# Patient Record
Sex: Male | Born: 1950 | Race: Black or African American | Hispanic: No | Smoking: Current every day smoker
Health system: Southern US, Community
[De-identification: ages and names within clinical notes are randomized; demographics above are authoritative.]

## PROBLEM LIST (undated history)

## (undated) DIAGNOSIS — I714 Abdominal aortic aneurysm, without rupture, unspecified: Secondary | ICD-10-CM

## (undated) DIAGNOSIS — E559 Vitamin D deficiency, unspecified: Secondary | ICD-10-CM

## (undated) DIAGNOSIS — F1910 Other psychoactive substance abuse, uncomplicated: Secondary | ICD-10-CM

## (undated) DIAGNOSIS — I499 Cardiac arrhythmia, unspecified: Secondary | ICD-10-CM

## (undated) DIAGNOSIS — E878 Other disorders of electrolyte and fluid balance, not elsewhere classified: Secondary | ICD-10-CM

## (undated) DIAGNOSIS — E785 Hyperlipidemia, unspecified: Secondary | ICD-10-CM

## (undated) DIAGNOSIS — I10 Essential (primary) hypertension: Secondary | ICD-10-CM

## (undated) DIAGNOSIS — I739 Peripheral vascular disease, unspecified: Secondary | ICD-10-CM

## (undated) DIAGNOSIS — K219 Gastro-esophageal reflux disease without esophagitis: Secondary | ICD-10-CM

## (undated) HISTORY — PX: COLONOSCOPY: SHX174

## (undated) HISTORY — DX: Peripheral vascular disease, unspecified: I73.9

## (undated) HISTORY — DX: Gastro-esophageal reflux disease without esophagitis: K21.9

## (undated) HISTORY — DX: Hyperlipidemia, unspecified: E78.5

## (undated) HISTORY — DX: Cardiac arrhythmia, unspecified: I49.9

## (undated) HISTORY — PX: HERNIA REPAIR: SHX51

## (undated) HISTORY — PX: UPPER GASTROINTESTINAL ENDOSCOPY: SHX188

## (undated) HISTORY — DX: Abdominal aortic aneurysm, without rupture: I71.4

## (undated) HISTORY — PX: TONSILLECTOMY: SUR1361

## (undated) HISTORY — DX: Other psychoactive substance abuse, uncomplicated: F19.10

## (undated) HISTORY — DX: Vitamin D deficiency, unspecified: E55.9

## (undated) HISTORY — PX: ROBOTIC ADRENALECTOMY: SHX6407

## (undated) HISTORY — DX: Abdominal aortic aneurysm, without rupture, unspecified: I71.40

---

## 2004-06-15 ENCOUNTER — Ambulatory Visit: Payer: Self-pay | Admitting: Internal Medicine

## 2004-06-19 ENCOUNTER — Ambulatory Visit: Payer: Self-pay | Admitting: *Deleted

## 2004-06-22 ENCOUNTER — Ambulatory Visit: Payer: Self-pay | Admitting: Internal Medicine

## 2004-10-30 ENCOUNTER — Ambulatory Visit: Payer: Self-pay | Admitting: Family Medicine

## 2005-06-01 ENCOUNTER — Ambulatory Visit: Payer: Self-pay | Admitting: Family Medicine

## 2005-07-25 ENCOUNTER — Ambulatory Visit: Payer: Self-pay | Admitting: Family Medicine

## 2005-12-31 ENCOUNTER — Ambulatory Visit: Payer: Self-pay | Admitting: Family Medicine

## 2006-04-10 ENCOUNTER — Ambulatory Visit: Payer: Self-pay | Admitting: Family Medicine

## 2006-04-23 ENCOUNTER — Ambulatory Visit: Payer: Self-pay | Admitting: Cardiology

## 2006-06-03 ENCOUNTER — Ambulatory Visit: Payer: Self-pay | Admitting: Family Medicine

## 2006-09-25 ENCOUNTER — Ambulatory Visit: Payer: Self-pay | Admitting: Family Medicine

## 2007-01-18 DIAGNOSIS — Z87898 Personal history of other specified conditions: Secondary | ICD-10-CM

## 2007-01-18 DIAGNOSIS — K056 Periodontal disease, unspecified: Secondary | ICD-10-CM | POA: Insufficient documentation

## 2007-01-18 DIAGNOSIS — I10 Essential (primary) hypertension: Secondary | ICD-10-CM

## 2007-01-18 DIAGNOSIS — K069 Disorder of gingiva and edentulous alveolar ridge, unspecified: Secondary | ICD-10-CM

## 2007-01-18 DIAGNOSIS — E785 Hyperlipidemia, unspecified: Secondary | ICD-10-CM

## 2007-01-21 DIAGNOSIS — F1411 Cocaine abuse, in remission: Secondary | ICD-10-CM | POA: Insufficient documentation

## 2007-01-21 DIAGNOSIS — Z9889 Other specified postprocedural states: Secondary | ICD-10-CM | POA: Insufficient documentation

## 2007-01-28 ENCOUNTER — Ambulatory Visit: Payer: Self-pay | Admitting: Family Medicine

## 2007-01-31 ENCOUNTER — Emergency Department (HOSPITAL_COMMUNITY): Admission: EM | Admit: 2007-01-31 | Discharge: 2007-01-31 | Payer: Self-pay | Admitting: Emergency Medicine

## 2007-02-12 ENCOUNTER — Encounter (INDEPENDENT_AMBULATORY_CARE_PROVIDER_SITE_OTHER): Payer: Self-pay | Admitting: *Deleted

## 2007-03-13 ENCOUNTER — Emergency Department (HOSPITAL_COMMUNITY): Admission: EM | Admit: 2007-03-13 | Discharge: 2007-03-13 | Payer: Self-pay | Admitting: Emergency Medicine

## 2007-05-16 ENCOUNTER — Ambulatory Visit: Payer: Self-pay | Admitting: Nurse Practitioner

## 2007-05-16 DIAGNOSIS — B351 Tinea unguium: Secondary | ICD-10-CM

## 2007-06-06 ENCOUNTER — Ambulatory Visit: Payer: Self-pay | Admitting: Family Medicine

## 2007-06-06 ENCOUNTER — Encounter (INDEPENDENT_AMBULATORY_CARE_PROVIDER_SITE_OTHER): Payer: Self-pay | Admitting: Nurse Practitioner

## 2007-06-06 LAB — CONVERTED CEMR LAB
Alkaline Phosphatase: 75 units/L (ref 39–117)
Basophils Absolute: 0 10*3/uL (ref 0.0–0.1)
Basophils Relative: 0 % (ref 0–1)
CO2: 28 meq/L (ref 19–32)
Chloride: 102 meq/L (ref 96–112)
Creatinine, Ser: 1.31 mg/dL (ref 0.40–1.50)
Eosinophils Absolute: 0.1 10*3/uL (ref 0.0–0.7)
Glucose, Bld: 96 mg/dL (ref 70–99)
HDL: 50 mg/dL (ref >39)
LDL Cholesterol: 115 mg/dL — ABNORMAL HIGH (ref 0–99)
Lymphs Abs: 1.6 10*3/uL (ref 0.7–4.0)
Microalb, Ur: 1.14 mg/dL (ref 0.00–1.89)
Monocytes Relative: 9 % (ref 3–12)
Nitrite: NEGATIVE
Platelets: 270 10*3/uL (ref 150–400)
Potassium: 3.8 meq/L (ref 3.5–5.3)
RBC: 4.83 M/uL (ref 4.22–5.81)
Specific Gravity, Urine: 1.025
Total CHOL/HDL Ratio: 3.6
Triglycerides: 83 mg/dL (ref <150)
Urobilinogen, UA: NEGATIVE
VLDL: 17 mg/dL (ref 0–40)
pH: 5.5

## 2007-06-09 ENCOUNTER — Encounter (INDEPENDENT_AMBULATORY_CARE_PROVIDER_SITE_OTHER): Payer: Self-pay | Admitting: Nurse Practitioner

## 2007-07-18 ENCOUNTER — Ambulatory Visit: Payer: Self-pay | Admitting: Nurse Practitioner

## 2007-07-18 LAB — CONVERTED CEMR LAB: Cholesterol, target level: 200 mg/dL

## 2007-09-11 ENCOUNTER — Emergency Department (HOSPITAL_COMMUNITY): Admission: EM | Admit: 2007-09-11 | Discharge: 2007-09-12 | Payer: Self-pay | Admitting: Emergency Medicine

## 2007-12-08 ENCOUNTER — Emergency Department (HOSPITAL_COMMUNITY): Admission: EM | Admit: 2007-12-08 | Discharge: 2007-12-08 | Payer: Self-pay | Admitting: Family Medicine

## 2008-09-06 ENCOUNTER — Ambulatory Visit: Payer: Self-pay | Admitting: Family Medicine

## 2008-09-06 LAB — CONVERTED CEMR LAB
Albumin: 4 g/dL (ref 3.5–5.2)
BUN: 12 mg/dL (ref 6–23)
CO2: 21 meq/L (ref 19–32)
Calcium: 9.2 mg/dL (ref 8.4–10.5)
Chloride: 107 meq/L (ref 96–112)
Creatinine, Ser: 1.08 mg/dL (ref 0.40–1.50)
Potassium: 3.9 meq/L (ref 3.5–5.3)
Total Protein: 7 g/dL (ref 6.0–8.3)

## 2008-09-10 ENCOUNTER — Encounter (INDEPENDENT_AMBULATORY_CARE_PROVIDER_SITE_OTHER): Payer: Self-pay | Admitting: Family Medicine

## 2008-09-10 ENCOUNTER — Emergency Department (HOSPITAL_COMMUNITY): Admission: EM | Admit: 2008-09-10 | Discharge: 2008-09-10 | Payer: Self-pay | Admitting: Emergency Medicine

## 2008-09-27 ENCOUNTER — Ambulatory Visit: Payer: Self-pay | Admitting: Family Medicine

## 2008-09-27 DIAGNOSIS — M169 Osteoarthritis of hip, unspecified: Secondary | ICD-10-CM

## 2008-10-28 ENCOUNTER — Ambulatory Visit: Payer: Self-pay | Admitting: Family Medicine

## 2008-10-28 LAB — CONVERTED CEMR LAB
ALT: 18 units/L (ref 0–53)
AST: 23 units/L (ref 0–37)
Calcium: 9.8 mg/dL (ref 8.4–10.5)
Chloride: 104 meq/L (ref 96–112)
Glucose, Bld: 93 mg/dL (ref 70–99)
HDL: 59 mg/dL (ref >39)
Total Bilirubin: 0.8 mg/dL (ref 0.3–1.2)
Total CHOL/HDL Ratio: 3
Triglycerides: 82 mg/dL (ref <150)

## 2008-10-29 ENCOUNTER — Encounter (INDEPENDENT_AMBULATORY_CARE_PROVIDER_SITE_OTHER): Payer: Self-pay | Admitting: Internal Medicine

## 2009-01-20 ENCOUNTER — Encounter (INDEPENDENT_AMBULATORY_CARE_PROVIDER_SITE_OTHER): Payer: Self-pay | Admitting: *Deleted

## 2009-02-15 ENCOUNTER — Ambulatory Visit: Payer: Self-pay | Admitting: Physician Assistant

## 2009-02-15 DIAGNOSIS — R209 Unspecified disturbances of skin sensation: Secondary | ICD-10-CM | POA: Insufficient documentation

## 2009-02-16 ENCOUNTER — Encounter: Payer: Self-pay | Admitting: Physician Assistant

## 2009-03-02 ENCOUNTER — Encounter: Admission: RE | Admit: 2009-03-02 | Discharge: 2009-03-18 | Payer: Self-pay | Admitting: Physician Assistant

## 2009-03-02 ENCOUNTER — Encounter: Payer: Self-pay | Admitting: Physician Assistant

## 2009-03-07 ENCOUNTER — Encounter: Payer: Self-pay | Admitting: Physician Assistant

## 2009-03-31 ENCOUNTER — Encounter: Payer: Self-pay | Admitting: Physician Assistant

## 2009-04-18 ENCOUNTER — Ambulatory Visit: Payer: Self-pay | Admitting: Physician Assistant

## 2009-04-18 LAB — CONVERTED CEMR LAB
Alkaline Phosphatase: 63 units/L (ref 39–117)
BUN: 19 mg/dL (ref 6–23)
Chloride: 105 meq/L (ref 96–112)
Creatinine, Ser: 1.12 mg/dL (ref 0.40–1.50)
Glucose, Bld: 93 mg/dL (ref 70–99)
Total Bilirubin: 0.4 mg/dL (ref 0.3–1.2)
Total Protein: 6.7 g/dL (ref 6.0–8.3)

## 2009-04-19 ENCOUNTER — Encounter: Payer: Self-pay | Admitting: Physician Assistant

## 2009-11-02 ENCOUNTER — Inpatient Hospital Stay (HOSPITAL_COMMUNITY): Admission: EM | Admit: 2009-11-02 | Discharge: 2009-11-03 | Payer: Self-pay | Admitting: Emergency Medicine

## 2009-11-02 ENCOUNTER — Encounter: Payer: Self-pay | Admitting: Physician Assistant

## 2009-11-03 ENCOUNTER — Encounter (INDEPENDENT_AMBULATORY_CARE_PROVIDER_SITE_OTHER): Payer: Self-pay | Admitting: Internal Medicine

## 2009-11-03 ENCOUNTER — Ambulatory Visit: Payer: Self-pay | Admitting: Surgery

## 2009-12-12 ENCOUNTER — Telehealth: Payer: Self-pay | Admitting: Physician Assistant

## 2009-12-15 ENCOUNTER — Ambulatory Visit: Payer: Self-pay | Admitting: Physician Assistant

## 2009-12-15 ENCOUNTER — Telehealth: Payer: Self-pay | Admitting: Physician Assistant

## 2009-12-15 DIAGNOSIS — R82998 Other abnormal findings in urine: Secondary | ICD-10-CM

## 2009-12-15 LAB — CONVERTED CEMR LAB
Bilirubin Urine: NEGATIVE
Ketones, urine, test strip: NEGATIVE
Nitrite: NEGATIVE
OCCULT 1: NEGATIVE
Specific Gravity, Urine: <1.005
pH: 5.5

## 2009-12-16 ENCOUNTER — Encounter: Payer: Self-pay | Admitting: Physician Assistant

## 2009-12-16 LAB — CONVERTED CEMR LAB
Basophils Relative: 0 % (ref 0–1)
CO2: 28 meq/L (ref 19–32)
Calcium: 9.5 mg/dL (ref 8.4–10.5)
Chloride: 103 meq/L (ref 96–112)
Creatinine, Ser: 1.22 mg/dL (ref 0.40–1.50)
Eosinophils Absolute: 0.2 10*3/uL (ref 0.0–0.7)
Eosinophils Relative: 3 % (ref 0–5)
HCT: 40.9 % (ref 39.0–52.0)
Hemoglobin: 13.7 g/dL (ref 13.0–17.0)
MCV: 93 fL (ref 78.0–100.0)
Monocytes Relative: 8 % (ref 3–12)
Neutro Abs: 2 10*3/uL (ref 1.7–7.7)
RBC / HPF: NONE SEEN (ref <3)
Sodium: 141 meq/L (ref 135–145)
Total Protein: 7 g/dL (ref 6.0–8.3)
WBC: 5.1 10*3/uL (ref 4.0–10.5)

## 2010-06-02 ENCOUNTER — Ambulatory Visit
Admission: RE | Admit: 2010-06-02 | Discharge: 2010-06-02 | Payer: Self-pay | Source: Home / Self Care | Attending: Physician Assistant | Admitting: Physician Assistant

## 2010-06-02 DIAGNOSIS — E669 Obesity, unspecified: Secondary | ICD-10-CM | POA: Insufficient documentation

## 2010-06-02 DIAGNOSIS — F17200 Nicotine dependence, unspecified, uncomplicated: Secondary | ICD-10-CM | POA: Insufficient documentation

## 2010-06-08 ENCOUNTER — Ambulatory Visit: Admit: 2010-06-08 | Payer: Self-pay | Admitting: Nurse Practitioner

## 2010-06-09 ENCOUNTER — Ambulatory Visit
Admission: RE | Admit: 2010-06-09 | Discharge: 2010-06-09 | Payer: Self-pay | Source: Home / Self Care | Attending: Internal Medicine | Admitting: Internal Medicine

## 2010-06-09 ENCOUNTER — Encounter (INDEPENDENT_AMBULATORY_CARE_PROVIDER_SITE_OTHER): Payer: Self-pay | Admitting: Nurse Practitioner

## 2010-06-12 ENCOUNTER — Encounter (INDEPENDENT_AMBULATORY_CARE_PROVIDER_SITE_OTHER): Payer: Self-pay | Admitting: Nurse Practitioner

## 2010-06-12 LAB — CONVERTED CEMR LAB
Cholesterol: 211 mg/dL — ABNORMAL HIGH (ref 0–200)
Triglycerides: 140 mg/dL (ref <150)

## 2010-06-27 NOTE — Letter (Signed)
Summary: DEPRESSION SCREENING  DEPRESSION SCREENING   Imported By: Arta Bruce 12/19/2009 12:24:13  _____________________________________________________________________  External Attachment:    Type:   Image     Comment:   External Document

## 2010-06-27 NOTE — Progress Notes (Signed)
Summary: GI referral   Phone Note Outgoing Call   Summary of Call: Needs screening colonoscopy with Dr. Doreatha Martin  Initial call taken by: Tereso Newcomer PA-C,  December 15, 2009 5:46 PM

## 2010-06-27 NOTE — Letter (Signed)
Summary: *HSN Results Follow up  [facility]  [address] [place], [place] [address]   Phone: [phone]  Fax: [phone]      12/16/2009   Eric Barnett [address] [place], [place]  [address]   Dear  [name],                            ____S.Drinkard,FNP   ____D. Gore,FNP       ____B. McPherson,MD   ____V. Rankins,MD    ____E. Mulberry,MD    ____N. Daphine Deutscher, FNP  ____D. Reche Dixon, MD    ____K. Philipp Deputy, MD    __x__S. Alben Spittle, PA-C    This letter is to inform you that your recent test(s):  _______Pap Smear    ___x____Lab Tests     _______X-ray    ___x____ are within normal limits  _______ requires a medication change  _______ requires a follow-up lab visit  _______ requires a follow-up visit with your provider   Comments:       _________________________________________________________ If you have any questions, please contact our office                     Sincerely,  Tereso Newcomer PA-C [facility]

## 2010-06-27 NOTE — Assessment & Plan Note (Signed)
Summary: CPE////KT   Vital Signs:  Patient profile:   60 year old male Weight:      203 pounds Temp:     97.8 degrees F oral Pulse rate:   73 / minute Pulse rhythm:   regular Resp:     18 per minute BP sitting:   127 / 83  (left arm) Cuff size:   regular  Vitals Entered By: Armenia Shannon (December 15, 2009 2:04 PM) CC: Complete physical Is Patient Diabetic? No Pain Assessment Patient in pain? no       Does patient need assistance? Functional Status Self care Ambulation Normal   Primary Care Provider:  Tereso Newcomer PA-C  CC:  Complete physical.  History of Present Illness: Here for CPE. No real complaints. PHQ9=2. Went to [facility] in June for near syncope.  Had gotten overheated.  Was welding at the time.  Creat was up but his CEs were negative.  Symptoms improved with hydration.  Feels fine now.  No more episodes of lightheadedness. Discussed PSA and he wants to get. No h/o colo. . . discussed and he wants to get.   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 Take 1 Tab By Mouth At Bedtime 2)  Hyzaar 100-25 Mg  Tabs (Losartan Potassium-Hctz) .Marland Kitchen.. 1 By Mouth Every Morning For Blood Pressure 3)  Adult Aspirin Ec Low Strength 81 Mg  Tbec (Aspirin) .Marland Kitchen.. 1 By Mouth Daily 4)  Naprosyn 500 Mg Tabs (Naproxen) .... Take 1 Tablet By Mouth  Every 12 Hours As Needed For Joint Pain. Take With Food 5)  Flexeril 10 Mg Tabs (Cyclobenzaprine Hcl) .... Take 1 Tablet By Mouth Two Times A Day As Needed  Allergies (verified): 1)  ! Jonne Ply  Past History:  Past Surgical History: Last updated: 01/18/2007 s/p inguinal hernia repair x2  Past Medical History: Reviewed history from 01/18/2007 and no changes required. Current Problems:  COCAINE ABUSE, HX OF (ICD-V15.9) INGUINAL HERNIA, HX OF (ICD-V13.8) HYPERTENSION, ESSENTIAL NOS (ICD-401.9) PERIODONTAL DISEASE (ICD-523.9) DYSLIPIDEMIA (ICD-272.4)  Family History: no prostate or colon cancer CAD - MI (mom at 32 died)  Social History: unemployed Current Smoker (4 cigs per day) Drug use-yes (still admits to cocaine) Alcohol use-yes (occ. beer or shot) Drug Use:  yes  Review of Systems      See HPI General:  Denies chills and fever. Eyes:  Denies blurring. CV:  Denies chest pain or discomfort, fainting, and shortness of breath with exertion. Resp:  Denies cough. GI:  Denies bloody stools, change in bowel habits, and dark tarry stools. GU:  Denies dysuria and hematuria. MS:  Denies joint pain. Derm:  Denies rash. Neuro:  Denies headaches. Psych:  Denies suicidal thoughts/plans. Heme:  Denies bleeding.  Physical Exam  General:  alert, well-developed, and well-nourished.   Head:  normocephalic and atraumatic.   Eyes:  pupils equal, pupils round, pupils reactive  to light, and no retinal abnormalitiies.   Ears:  R ear normal and L ear normal.   Nose:  no external deformity.   Mouth:  pharynx pink and moist.   Neck:  supple, no thyromegaly, and no cervical lymphadenopathy.   Lungs:  normal breath sounds, no crackles, and no wheezes.   Heart:  normal rate, regular rhythm, and no murmur.   Abdomen:  soft, non-tender, normal bowel sounds, and no hepatomegaly.   Rectal:  no external abnormalities, no hemorrhoids, normal sphincter tone, no masses, no tenderness, no fissures, no  fistulae, and no perianal rash.   Genitalia:  circumcised, no hydrocele, no scrotal masses, no testicular masses or atrophy, no cutaneous lesions, and no urethral discharge.   Prostate:  no gland enlargement, no nodules, no asymmetry, and no induration.   Msk:  normal ROM.   Pulses:  R posterior tibial normal, R dorsalis pedis normal, L posterior tibial normal, and L dorsalis pedis normal.   Extremities:  no edema Neurologic:  alert & oriented X3, cranial nerves II-XII intact, gait normal, and Romberg negative.   Skin:  turgor normal.   Psych:  normally interactive and good eye contact.     Impression & Recommendations:  Problem # 1:  COCAINE ABUSE, HX OF (ICD-V15.9) discussed going to counseling and cessation of cocaine he declines counseling discussed dangers of drug abuse and he understands he wants to quit  Problem # 2:  EXAMINATION, ROUTINE MEDICAL (ICD-V70.0) arrange screening colo  Orders: T-Comprehensive Metabolic Panel (81191-47829) T-CBC w/Diff (56213-08657) UA Dipstick w/o Micro (manual) (84696) T-Hemoccult Cards-Multiple (82270) T-PSA (29528-41324) Gastroenterology Referral (GI) Rapid HIV  (40102) T-TSH (72536-64403)  Problem # 3:  HYPERTENSION, ESSENTIAL NOS (ICD-401.9) controlled check labs today with recent h/o dehydration and increased creatinine  His updated medication list for this problem includes:    Hyzaar 100-25 Mg Tabs (Losartan potassium-hctz) .Marland Kitchen... 1 by mouth every morning for blood pressure  Orders: T-Comprehensive Metabolic Panel ([id number]) T-CBC w/Diff (75643-32951) UA Dipstick w/o Micro (manual) (88416) T-TSH (60630-16010)  Problem # 4:  DYSLIPIDEMIA (ICD-272.4) schedule f/u labs  His updated medication list for this problem includes:    Lipitor 10 Mg Tabs (Atorvastatin calcium) .Marland Kitchen... Take 1 tab by mouth at bedtime  Complete Medication List: 1)  Lipitor 10 Mg Tabs (Atorvastatin calcium) .... Take 1 tab by mouth at bedtime 2)  Hyzaar  100-25 Mg Tabs (Losartan potassium-hctz) .Marland Kitchen.. 1 by mouth every morning for blood pressure 3)  Adult Aspirin Ec Low Strength 81 Mg Tbec (Aspirin) .Marland Kitchen.. 1 by mouth daily 4)  Naprosyn 500 Mg Tabs (Naproxen) .... Take 1 tablet by mouth every 12 hours as needed for joint pain. take with food 5)  Flexeril 10 Mg Tabs (Cyclobenzaprine hcl) .... Take 1 tablet by mouth two times a day as needed  Other Orders: T- * Misc. Laboratory test [phone])  Patient Instructions: 1)  Schedule fasting lipids. 2)  Complete stool cards and return. 3)  Tobacco is very bad for your health and your loved ones ! You should stop smoking !  4)  Stop smoking tips: Choose a quit date. Cut down before the quit date. Decide what you will do as a substitute when you feel the urge to smoke(gum, toothpick, exercise).  5)  Someone will call you about scheduling your colonoscopy. 6)  Please schedule a follow-up appointment in 4 months with Laken Rog for blood pressure.   Laboratory Results   Urine Tests    Routine Urinalysis   Color:  lt. yellow Glucose: negative   (Normal Range: Negative) Bilirubin: negative   (Normal Range: Negative) Ketone: negative   (Normal Range: Negative) Spec. Gravity: <1.005   (Normal Range: 1.003-1.035) Blood: trace-intact   (Normal Range: Negative) pH: 5.5   (Normal Range: 5.0-8.0) Protein: negative   (Normal Range: Negative) Urobilinogen: 0.2   (Normal Range: 0-1) Nitrite: negative   (Normal Range: Negative) Leukocyte Esterace: negative   (Normal Range: Negative)      Stool - Occult Blood Hemmoccult #1: negative Date: 12/15/2009    Tetanus/Td Vaccine    Vaccine Type: Tdap    Site: right deltoid    Mfr: Sanofi Pasteur    Dose: 0.5 ml    Route: IM    Given by: Armenia Shannon    Exp. Date: 05/10/2012    Lot #: [id number]    VIS given: 04/15/07 version given December 15, 2009.

## 2010-06-27 NOTE — Progress Notes (Signed)
Summary: needs f/u   Patient has appt. on July 21,2011... Armenia Shannon  December 12, 2009 3:49 PM  Phone Note Outgoing Call   Summary of Call:  Patient adx to hosp recently. Make sure he has f/u.  Have not seen him for BP f/u in long time. If no f/u for several weeks, have him come in for a bmet this week (creatinine was high in hospital).  Initial call taken by: Tereso Newcomer PA-C,  December 12, 2009 1:40 PM       Patient admitted to [facility] 6.8-6.9.2011 with near syncope. ARF resolved prior to d/c with admx creat 1.9. Head CT neg. CPK elevated by trop neg. x 3. Renal u/s ok. ANA neg. Felt to related to dehydration.  Patient demanded to leave hospital.  No further w/u done.

## 2010-06-29 NOTE — Assessment & Plan Note (Signed)
Summary: HTN   Vital Signs:  Patient profile:   60 year old male Weight:      209.7 pounds BMI:     28.35 Temp:     97.0 degrees F oral Pulse rate:   96 / minute Pulse rhythm:   regular Resp:     20 per minute BP sitting:   96 / 80  (left arm) Cuff size:   regular  Vitals Entered By: Levon Hedger (June 02, 2010 3:39 PM)  Nutrition Counseling: Patient's BMI is greater than 25 and therefore counseled on weight management options. CC: follow-up visit, Lipid Management, Hypertension Management Is Patient Diabetic? No Pain Assessment Patient in pain? no       Does patient need assistance? Functional Status Self care Ambulation Normal   Primary Care Provider:  Tereso Newcomer PA-C  CC:  follow-up visit, Lipid Management, and Hypertension Management.  History of Present Illness:  Pt into the office for f/u on blood pressure and cholesterol.  Obesity - up 6 pounds since last visit. tries to walk for exercise daily  No acute problems today. Pt just here for routine f/u.  Hypertension History:      He denies headache, chest pain, and palpitations.        Positive major cardiovascular risk factors include male age 97 years old or older, hyperlipidemia, hypertension, and current tobacco user.  Negative major cardiovascular risk factors include no history of diabetes.        Further assessment for target organ damage reveals no history of ASHD, cardiac end-organ damage (CHF/LVH), stroke/TIA, peripheral vascular disease, renal insufficiency, or hypertensive retinopathy.    Lipid Management History:      Positive NCEP/ATP III risk factors include male age 41 years old or older, current tobacco user, and hypertension.  Negative NCEP/ATP III risk factors include non-diabetic, no ASHD (atherosclerotic heart disease), no prior stroke/TIA, no peripheral vascular disease, and no history of aortic aneurysm.        He expresses no side effects from his lipid-lowering medication.   Comments include: pt is taking meds as ordered - though he is not fasting today for labs.  The patient denies any symptoms to suggest myopathy or liver disease.      Habits & Providers  Alcohol-Tobacco-Diet     Tobacco Status: current  Exercise-Depression-Behavior     Does Patient Exercise: yes     Type of exercise: lift weights     Drug Use: yes     Seat Belt Use: 100     Sun Exposure: occasionally  Comments: No cigarette in 4 days  Current Medications (verified): 1)  Lipitor 10 Mg Tabs (Atorvastatin Calcium) .... Take 1 Tab By Mouth At Bedtime 2)  Hyzaar 100-25 Mg  Tabs (Losartan Potassium-Hctz) .Marland Kitchen.. 1 By Mouth Every Morning For Blood Pressure 3)  Adult Aspirin Ec Low Strength 81 Mg  Tbec (Aspirin) .Marland Kitchen.. 1 By Mouth Daily 4)  Naprosyn 500 Mg Tabs (Naproxen) .... Take 1 Tablet By Mouth Every 12 Hours As Needed For Joint Pain. Take With Food 5)  Flexeril 10 Mg Tabs (Cyclobenzaprine Hcl) .... Take 1 Tablet By Mouth Two Times A Day As Needed  Allergies (verified): 1)  ! Asa  Review of Systems CV:  Denies chest pain or discomfort. Resp:  Denies cough. GI:  Denies abdominal pain, nausea, and vomiting.  Physical Exam  General:  alert.   Head:  normocephalic.   Eyes:  glasses Mouth:  missing front tooth Lungs:  normal  breath sounds.   Heart:  normal rate and regular rhythm.   Abdomen:  normal bowel sounds.   Msk:  up to the exam table Neurologic:  alert & oriented X3.   Skin:  color normal.   Psych:  Oriented X3.     Impression & Recommendations:  Problem # 1:  HYPERTENSION, ESSENTIAL NOS (ICD-401.9) BP actually low today advised pt to monitor - may need to decrease meds if continues to be low His updated medication list for this problem includes:    Hyzaar 100-25 Mg Tabs (Losartan potassium-hctz) .Marland Kitchen... 1 by mouth every morning for blood pressure  Problem # 2:  DYSLIPIDEMIA (ICD-272.4) pt to return for fasting labs pt to keep taking meds until labs done His updated  medication list for this problem includes:    Lipitor 10 Mg Tabs (Atorvastatin calcium) .Marland Kitchen... Take 1 tab by mouth at bedtime  Problem # 3:  TOBACCO ABUSE (ICD-305.1) advised cessation  Problem # 4:  OBESITY (ICD-278.00) up 6 pounds since the last visit.  advised pt to increase ambulation  Complete Medication List: 1)  Lipitor 10 Mg Tabs (Atorvastatin calcium) .... Take 1 tab by mouth at bedtime 2)  Hyzaar 100-25 Mg Tabs (Losartan potassium-hctz) .Marland Kitchen.. 1 by mouth every morning for blood pressure 3)  Adult Aspirin Ec Low Strength 81 Mg Tbec (Aspirin) .Marland Kitchen.. 1 by mouth daily  Hypertension Assessment/Plan:      The patient's hypertensive risk group is category B: At least one risk factor (excluding diabetes) with no target organ damage.  His calculated 10 year risk of coronary heart disease is 18 %.  Today's blood pressure is 96/80.  His blood pressure goal is < 140/90.  Lipid Assessment/Plan:      Based on NCEP/ATP III, the patient's risk factor category is "2 or more risk factors and a calculated 10 year CAD risk of < 20%".  The patient's lipid goals are as follows: Total cholesterol goal is 200; LDL cholesterol goal is 100; HDL cholesterol goal is 40; Triglyceride goal is 150.    Patient Instructions: 1)  Schedule an appointment for fasting lab visit - will need lipids, lft 2)  No food after midnight before this visit. 3)  Call for a follow up with provider in 6 months for high blood pressure and cholesterol 4)  Keep up your efforts to quit smoking.    Orders Added: 1)  Est. Patient Level III [16109]    Prevention & Chronic Care Immunizations   Influenza vaccine: Not documented   Influenza vaccine deferral: Refused  (06/02/2010)    Tetanus booster: Not documented    Pneumococcal vaccine: Not documented    H. zoster vaccine: Not documented  Colorectal Screening   Hemoccult: Not documented    Colonoscopy: Not documented  Other Screening   PSA: 1.09   (12/15/2009)   Smoking status: current  (06/02/2010)   Smoking cessation counseling: YES  (12/15/2009)  Lipids   Total Cholesterol: 179  (10/28/2008)   LDL: 104  (10/28/2008)   LDL Direct: Not documented   HDL: 59  (10/28/2008)   Triglycerides: 82  (10/28/2008)    SGOT (AST): 21  (12/15/2009)   SGPT (ALT): 15  (12/15/2009)   Alkaline phosphatase: 61  (12/15/2009)   Total bilirubin: 0.5  (12/15/2009)  Hypertension   Last Blood Pressure: 96 / 80  (06/02/2010)   Serum creatinine: 1.22  (12/15/2009)   Serum potassium 4.0  (12/15/2009)  Self-Management Support :    Hypertension self-management support: Not  documented    Lipid self-management support: Not documented

## 2010-06-29 NOTE — Letter (Signed)
Summary: Lipid Letter  [facility]  [address] [place], [place] [address]   Phone: [phone]  Fax: [phone]    06/12/2010  Eric Barnett [address] [place], [place]  [address]  Dear Eric Barnett:  We have carefully reviewed your last lipid profile from 06/09/2010 and the results are noted below with a summary of recommendations for lipid management.    Cholesterol:      211    Goal: less than 200   HDL "good" Cholesterol:   45     Goal: greater than 40   LDL "bad" Cholesterol:  138    Goal: less than 100   Triglycerides:       140     Goal: less than 150    Labs done during recent office visit shows that your cholesterol is still high.  You should have been contacted by this office about the need to change your medications. You should also be eating a low fat, low cholesterol diet.     Current Medications: 1)    Lipitor 20 Mg Tabs (Atorvastatin calcium) .... One tablet by mouth nightly for cholesterol **note increase in dose** 2)    Hyzaar 100-25 Mg  Tabs (Losartan potassium-hctz) .Marland Kitchen.. 1 by mouth every morning for blood pressure 3)    Adult Aspirin Ec Low Strength 81 Mg  Tbec (Aspirin) .Marland Kitchen.. 1 by mouth daily  If you have any questions, please call. We appreciate being able to work with you.   Sincerely,    Lehman Prom, FNP [facility]

## 2010-07-31 ENCOUNTER — Inpatient Hospital Stay (INDEPENDENT_AMBULATORY_CARE_PROVIDER_SITE_OTHER)
Admission: RE | Admit: 2010-07-31 | Discharge: 2010-07-31 | Disposition: A | Discharge status: Home / Self Care | Payer: Self-pay | Source: Ambulatory Visit | Attending: Emergency Medicine | Admitting: Emergency Medicine

## 2010-07-31 DIAGNOSIS — L988 Other specified disorders of the skin and subcutaneous tissue: Secondary | ICD-10-CM

## 2010-08-14 LAB — URINE MICROSCOPIC-ADD ON

## 2010-08-14 LAB — COMPREHENSIVE METABOLIC PANEL
ALT: 21 U/L (ref 0–53)
AST: 32 U/L (ref 0–37)
Albumin: 3.1 g/dL — ABNORMAL LOW (ref 3.5–5.2)
Calcium: 8.8 mg/dL (ref 8.4–10.5)
GFR calc Af Amer: >60 mL/min (ref >60)
Sodium: 140 mEq/L (ref 135–145)
Total Protein: 5.4 g/dL — ABNORMAL LOW (ref 6.0–8.3)

## 2010-08-14 LAB — URINALYSIS, ROUTINE W REFLEX MICROSCOPIC
Glucose, UA: NEGATIVE mg/dL
Hgb urine dipstick: NEGATIVE
Protein, ur: 100 mg/dL — AB
Specific Gravity, Urine: 1.033 — ABNORMAL HIGH (ref 1.005–1.030)
pH: 5.5 (ref 5.0–8.0)

## 2010-08-14 LAB — CK TOTAL AND CKMB (NOT AT ARMC)
CK, MB: 11 ng/mL (ref 0.3–4.0)
CK, MB: 8.6 ng/mL (ref 0.3–4.0)
Relative Index: 1.2 (ref 0.0–2.5)
Relative Index: 1.3 (ref 0.0–2.5)
Total CK: 842 U/L — ABNORMAL HIGH (ref 7–232)

## 2010-08-14 LAB — POCT I-STAT, CHEM 8
Creatinine, Ser: 1.9 mg/dL — ABNORMAL HIGH (ref 0.4–1.5)
Glucose, Bld: 122 mg/dL — ABNORMAL HIGH (ref 70–99)
HCT: 45 % (ref 39.0–52.0)
Potassium: 3.5 mEq/L (ref 3.5–5.1)
Sodium: 140 mEq/L (ref 135–145)
TCO2: 27 mmol/L (ref 0–100)

## 2010-08-14 LAB — CBC
Platelets: 144 10*3/uL — ABNORMAL LOW (ref 150–400)
RDW: 13.2 % (ref 11.5–15.5)

## 2010-08-14 LAB — CARDIAC PANEL(CRET KIN+CKTOT+MB+TROPI): Troponin I: 0.02 ng/mL (ref 0.00–0.06)

## 2010-08-14 LAB — TROPONIN I: Troponin I: 0.02 ng/mL (ref 0.00–0.06)

## 2010-08-14 LAB — SODIUM, URINE, RANDOM: Sodium, Ur: 93 mEq/L

## 2010-08-17 ENCOUNTER — Telehealth (INDEPENDENT_AMBULATORY_CARE_PROVIDER_SITE_OTHER): Payer: Self-pay | Admitting: Internal Medicine

## 2010-08-29 NOTE — Progress Notes (Signed)
  Phone Note From Pharmacy   Caller: [facility] Summary of Call: Request for refill of Lipitor 10 mg--pt. is supposed to be taking 20 mg--will resend to pharmacy.  Pt. will need a repeat FLP, liver in 8 weeks--please schedule and renotify pt. of dosage change Looks like he has not picked up in over a month. Initial call taken by: Julieanne Manson MD,  August 17, 2010 5:22 PM  Follow-up for Phone Call        LM w/male... Hale Drone CMA  August 18, 2010 4:03 PM   LM w/Antoinette to have pt. call us back... Hale Drone CMA  August 23, 2010 4:46 PM   Additional Follow-up for Phone Call Additional follow up Details #1::        pt is aware....and will call us for appt..Armenia Shannon  August 24, 2010 9:07 AM     Prescriptions: LIPITOR 20 MG TABS (ATORVASTATIN CALCIUM) One tablet by mouth nightly for cholesterol **note increase in dose**  #30 x 5   Entered and Authorized by:   Julieanne Manson MD   Signed by:   Julieanne Manson MD on 08/17/2010   Method used:   Faxed to ...       [facility] (retail)       [address] [place], [place]  [address]       Ph: [phone]       Fax: [phone]   RxID:   [id number]

## 2010-09-06 LAB — BASIC METABOLIC PANEL
BUN: 14 mg/dL (ref 6–23)
CO2: 29 mEq/L (ref 19–32)
GFR calc non Af Amer: >60 mL/min (ref >60)
Glucose, Bld: 124 mg/dL — ABNORMAL HIGH (ref 70–99)
Potassium: 3.8 mEq/L (ref 3.5–5.1)

## 2010-09-06 LAB — DIFFERENTIAL
Basophils Absolute: 0 10*3/uL (ref 0.0–0.1)
Basophils Relative: 0 % (ref 0–1)
Eosinophils Absolute: 0 10*3/uL (ref 0.0–0.7)
Eosinophils Relative: 0 % (ref 0–5)
Lymphocytes Relative: 11 % — ABNORMAL LOW (ref 12–46)

## 2010-09-06 LAB — URINALYSIS, ROUTINE W REFLEX MICROSCOPIC
Leukocytes, UA: NEGATIVE
Nitrite: NEGATIVE
Specific Gravity, Urine: 1.029 (ref 1.005–1.030)
pH: 7 (ref 5.0–8.0)

## 2010-09-06 LAB — URINE MICROSCOPIC-ADD ON

## 2010-09-06 LAB — CBC
HCT: 43.5 % (ref 39.0–52.0)
MCHC: 33.8 g/dL (ref 30.0–36.0)
Platelets: 263 10*3/uL (ref 150–400)
RDW: 13.5 % (ref 11.5–15.5)

## 2010-10-13 NOTE — Assessment & Plan Note (Signed)
[facility]                              CARDIOLOGY OFFICE NOTE   NAME:MILESElim, Economou                          MRN:          [id number]  DATE:04/23/2006                            DOB:          [birth year]    REFERRING PHYSICIAN:  Turkey R. Rankins, M.D.   I was asked by Dr. Beverley Fiedler to evaluate Dannial Monarch with an abnormal  EKG.   HISTORY OF PRESENT ILLNESS:  Mr. Chuba is a very pleasant 60 year old  gentleman who comes in today with the above problems.   His cardiac risk factors are numerous including age, sex, hypertension,  hyperlipidemia, tobacco use of about 5 cigarettes a day and cocaine/crack  use, last episode about 3 weeks ago.   He has a history of being noncompliant as outlined by Dr. Barbaraann Barthel. He denies  any exertional chest pain or symptoms consistent with coronary ischemia. He  has no dyspnea on exertion. He has no orthopnea, PND or peripheral edema.   PAST MEDICAL HISTORY:  He is currently taking HCTZ 25 mg a day and  lovastatin 20 mg a day. He says he cannot take a regular aspirin because it  hurts his stomach. He has had no history of peptic ulcer disease or  bleeding.   He does not drink. He does not exercise on a regular basis.   PAST SURGICAL HISTORY:  He has had a hernia repair in the past.   FAMILY HISTORY:  Negative for premature coronary disease.   SOCIAL HISTORY:  He is single, no children. He comes with a lady today who  is his significant other. He is not working.   REVIEW OF SYSTEMS:  CONSTITUTIONAL:  Negative weight loss, change in weight  and no fever. HEENT:  No headaches, visual changes, hearing loss, dental  problems. NECK:  No pain, no decreased mobility. CARDIAC:  See HPI.  PULMONARY:  Negative cough, hemoptysis, productive sputum. GI:  Negative  nausea, vomiting, dysphagia, abdominal pain, melena, hematochezia. GU:  Negative. NEUROLOGIC:  Negative. PSYCHIATRIC:  Negative. MUSCULOSKELETAL:  Negative. SKIN:  Negative.   PHYSICAL EXAMINATION:  GENERAL:  A very pleasant gentleman in no acute  distress. He is alert and oriented x3.  NEUROLOGIC:  Intact.  VITAL SIGNS:  His blood pressure is 142/86, his pulse is 67 and regular. He  is 6 foot 2, weighs 203 pounds.  HEENT:  He looks than his stated age. He had some slightly graying pattern.  He has a mustache. HEENT otherwise PERRLA, extraocular movements intact.  Sclera is slightly muddy. Facial symmetry is normal, dentition is  satisfactory.  NECK:  Supple. Carotid upstrokes are equal bilaterally without bruits. No  JVD. Thyroid is not enlarged, trachea is midline.  LUNGS:  Clear to auscultation.  HEART:  Reveals a nondisplaced PMI. He has normal S1 and S2.  ABDOMEN:  Soft with good bowel sounds. No midline bruit, no hepatomegalia.  No tenderness.  EXTREMITIES:  Reveal no cyanosis, clubbing or edema. Pulses were present  bilaterally, feet are warm. No sign of varicosities.  MUSCULOSKELETAL:  Negative.  SKIN:  Unremarkable.   Electrocardiogram  was repeated and shows sinus rhythm with minimal ST  segment changes in 3 and AVF with ST segment flattening. He has got  borderline criteria for LVH.   Comparing his EKG to that of [facility] there has been no significant  change.   ASSESSMENT/PLAN:  I have had about a 30 minute discussion with Mr. Craze and  his significant other. We talked about the risks associated with his  noncompliance with treatment for his hypertension, hyperlipidemia,  continuing to smoke, and the use of crack cocaine. We talked about the risk  of stroke, myocardial infarction, sudden cardiac arrest or sudden cardiac  death as well as problems with his kidneys.   At this time, he is asymptomatic. I do not think any objective assessment of  his coronaries needs to be done. I have recommended him to stay on HCTZ,  lovastatin and also to add an enteric coated aspirin 81 mg a day in hopes  that he can  tolerate this.   Will see him back on a p.r.n. basis.     Thomas C. Daleen Squibb, MD, [facility]  Electronically Signed    TCW/MedQ  DD: 04/23/2006  DT: 04/23/2006  Job #: [id number]   cc:   Fanny Dance. Rankins, M.D.

## 2011-02-20 LAB — POCT CARDIAC MARKERS
Operator id: 282201
Troponin i, poc: <0.05

## 2011-02-20 LAB — POCT I-STAT, CHEM 8
BUN: 17
Calcium, Ion: 1.25
Chloride: 100
Creatinine, Ser: 1.3
Glucose, Bld: 114 — ABNORMAL HIGH
TCO2: 30

## 2011-05-20 ENCOUNTER — Emergency Department (HOSPITAL_COMMUNITY)
Admission: EM | Admit: 2011-05-20 | Discharge: 2011-05-20 | Disposition: A | Discharge status: Home / Self Care | Payer: Self-pay | Source: Home / Self Care | Attending: Family Medicine | Admitting: Family Medicine

## 2011-05-20 ENCOUNTER — Emergency Department (INDEPENDENT_AMBULATORY_CARE_PROVIDER_SITE_OTHER): Payer: Self-pay

## 2011-05-20 DIAGNOSIS — IMO0002 Reserved for concepts with insufficient information to code with codable children: Secondary | ICD-10-CM

## 2011-05-20 HISTORY — DX: Other disorders of electrolyte and fluid balance, not elsewhere classified: E87.8

## 2011-05-20 HISTORY — DX: Essential (primary) hypertension: I10

## 2011-05-20 MED ORDER — NAPROXEN 500 MG PO TABS: 500.0000 mg | ORAL_TABLET | Freq: Two times a day (BID) | ORAL | Status: AC

## 2011-05-20 MED ORDER — HYDROCODONE-ACETAMINOPHEN 5-325 MG PO TABS: ORAL_TABLET | ORAL | Status: AC

## 2011-05-20 NOTE — ED Notes (Signed)
Pt has had rt upper leg pain for three weeks, thought he had pulled a muscle at work, but not improving and unable to work.  Painful to walk and bend.

## 2011-05-20 NOTE — ED Provider Notes (Signed)
History     CSN: [id number]  Arrival date & time 05/20/11  1042   First MD Initiated Contact with Patient 05/20/11 1121      Chief Complaint  Patient presents with  . Leg Pain    (Consider location/radiation/quality/duration/timing/severity/associated sxs/prior treatment) HPI Comments: Eric Barnett presents for evaluation of pain in his RIGHT thigh. He denies any specific injury. He works in a [employer], and does a lot of bending and stooping to pick up packages. He denies any numbness, weakness, or tingling. He denies any falls.   Patient is a 60 y.o. male presenting with leg pain. The history is provided by the patient.  Leg Pain  The incident occurred more than 1 week ago. The incident occurred at home. There was no injury mechanism. The pain is present in the right thigh. Pertinent negatives include no numbness, no muscle weakness, no loss of sensation and no tingling.    Past Medical History  Diagnosis Date  . Hypertension   . Hyperchloremia     Past Surgical History  Procedure Date  . Hernia repair     History reviewed. No pertinent family history.  History  Substance Use Topics  . Smoking status: Not on file  . Smokeless tobacco: Not on file  . Alcohol Use: No      Review of Systems  Constitutional: Negative.   HENT: Negative.   Eyes: Negative.   Respiratory: Negative.   Cardiovascular: Negative.   Gastrointestinal: Negative.   Genitourinary: Negative.   Musculoskeletal: Positive for arthralgias and gait problem. Negative for joint swelling.  Skin: Negative.   Neurological: Negative.  Negative for tingling and numbness.    Allergies  Aspirin  Home Medications   Current Outpatient Rx  Name Route Sig Dispense Refill  . ATORVASTATIN CALCIUM 20 MG PO TABS Oral Take 20 mg by mouth daily.      Marland Kitchen PRESCRIPTION MEDICATION  ?b/p pill     . NAPROXEN 500 MG PO TABS Oral Take 1 tablet (500 mg total) by mouth 2 (two) times daily. 30 tablet 0    BP  135/55  Pulse 76  Temp(Src) 98 F (36.7 C) (Oral)  Resp 20  SpO2 99%  Physical Exam  Nursing note and vitals reviewed. Constitutional: He is oriented to person, place, and time. He appears well-developed and well-nourished.  HENT:  Head: Normocephalic and atraumatic.  Eyes: EOM are normal.  Neck: Normal range of motion.  Pulmonary/Chest: Effort normal.  Musculoskeletal: Normal range of motion.       Right upper leg: He exhibits no tenderness and no bony tenderness.       Legs: Neurological: He is alert and oriented to person, place, and time. He has normal strength. No sensory deficit.  Skin: Skin is warm and dry.  Psychiatric: His behavior is normal.    ED Course  Procedures (including critical care time)  Labs Reviewed - No data to display No results found.   1. Sprain or strain of hip adductor       MDM          Richardo Priest, MD 06/03/11 1156

## 2013-05-18 ENCOUNTER — Ambulatory Visit: Payer: BC Managed Care – PPO | Admitting: Podiatry

## 2013-06-01 ENCOUNTER — Ambulatory Visit: Payer: BC Managed Care – PPO | Admitting: Podiatry

## 2014-01-07 ENCOUNTER — Encounter: Payer: Self-pay | Admitting: Vascular Surgery

## 2014-01-14 ENCOUNTER — Ambulatory Visit: Payer: Self-pay | Admitting: Cardiovascular Disease

## 2014-01-15 ENCOUNTER — Encounter: Payer: Self-pay | Admitting: Cardiovascular Disease

## 2014-01-15 ENCOUNTER — Ambulatory Visit (INDEPENDENT_AMBULATORY_CARE_PROVIDER_SITE_OTHER): Payer: BC Managed Care – PPO | Admitting: Cardiovascular Disease

## 2014-01-15 VITALS — BP 128/66 | HR 71 | Ht 72.0 in | Wt 172.0 lb

## 2014-01-15 DIAGNOSIS — R002 Palpitations: Secondary | ICD-10-CM | POA: Diagnosis not present

## 2014-01-15 DIAGNOSIS — I499 Cardiac arrhythmia, unspecified: Secondary | ICD-10-CM | POA: Insufficient documentation

## 2014-01-15 DIAGNOSIS — I491 Atrial premature depolarization: Secondary | ICD-10-CM | POA: Diagnosis not present

## 2014-01-15 DIAGNOSIS — I714 Abdominal aortic aneurysm, without rupture, unspecified: Secondary | ICD-10-CM | POA: Diagnosis not present

## 2014-01-15 DIAGNOSIS — E785 Hyperlipidemia, unspecified: Secondary | ICD-10-CM

## 2014-01-15 DIAGNOSIS — I10 Essential (primary) hypertension: Secondary | ICD-10-CM

## 2014-01-15 NOTE — Patient Instructions (Signed)
  We will see you back in follow up only as needed.   Dr Gwenlyn Found has ordered:  Event monitor (30 days). Event monitors are medical devices that record the heart's electrical activity. Doctors most often [place] these monitors to diagnose arrhythmias. Arrhythmias are problems with the speed or rhythm of the heartbeat. The monitor is a small, portable device. You can wear one while you do your normal daily activities. This is usually used to diagnose what is causing palpitations/syncope (passing out).

## 2014-01-15 NOTE — Assessment & Plan Note (Signed)
Not on statin therapy. Followed by his PCP

## 2014-01-15 NOTE — Assessment & Plan Note (Signed)
Apparently noted during physical exam. He is entirely asymptomatic from this. I'm going to get a 30 day event monitor to further characterize

## 2014-01-15 NOTE — Assessment & Plan Note (Signed)
Found on physical exam and duplex ultrasound. It sounds like he's been referred to a vascular surgeon for further evaluation

## 2014-01-15 NOTE — Assessment & Plan Note (Signed)
Well-controlled on current medications 

## 2014-01-15 NOTE — Progress Notes (Signed)
01/15/2014 Eric Barnett   1951-03-29  [id number]  Primary Physician Benna Dunks, Luane School, NP Primary Cardiologist: Lorretta Harp MD Renae Gloss   HPI:  Eric Barnett is a 63 year old thin appearing divorced African American male with no children he works as a Special educational needs teacher. He is referred by Yisroel Ramming PA-C from [facility] urgent care for evaluation of an irregular heart rate. The patient's cardiovascular risk factor profile is notable for tobacco use having smoked 1 12:45 half pack per day for the last 40 years. History of hypertension as well as hyperlipidemia not on statin therapy. There is no family history of heart disease. He has never had a heart attack or stroke, denies chest pain, shortness of breath or palpitations. He apparently has an abdominal aortic aneurysm diagnosed by ultrasound. He has been referred to a vascular surgeon. Primary care physician appreciated an irregular heart rhythm on exam apparently it was he was referred here for further evaluation. He is unaware of palpitations and denies dizziness or presyncope.   Current Outpatient Prescriptions  Medication Sig Dispense Refill  . aspirin 81 MG tablet Take 81 mg by mouth daily.      Marland Kitchen losartan-hydrochlorothiazide (HYZAAR) 100-12.5 MG per tablet Take 1 tablet by mouth daily.      . sildenafil (VIAGRA) 100 MG tablet Take 100 mg by mouth daily as needed for erectile dysfunction.       No current facility-administered medications for this visit.    Allergies  Allergen Reactions  . Aspirin     REACTION: upset stomach    History   Social History  . Marital Status: Divorced    Spouse Name: N/A    Number of Children: N/A  . Years of Education: N/A   Occupational History  . Not on file.   Social History Main Topics  . Smoking status: Current Every Day Smoker  . Smokeless tobacco: Not on file  . Alcohol Use: No  . Drug Use: Not on file  . Sexual Activity: Not on file   Other Topics  Concern  . Not on file   Social History Narrative  . No narrative on file     Review of Systems: General: negative for chills, fever, night sweats or weight changes.  Cardiovascular: negative for chest pain, dyspnea on exertion, edema, orthopnea, palpitations, paroxysmal nocturnal dyspnea or shortness of breath Dermatological: negative for rash Respiratory: negative for cough or wheezing Urologic: negative for hematuria Abdominal: negative for nausea, vomiting, diarrhea, bright red blood per rectum, melena, or hematemesis Neurologic: negative for visual changes, syncope, or dizziness All other systems reviewed and are otherwise negative except as noted above.    Blood pressure 128/66, pulse 71, height 6' (1.829 m), weight 172 lb (78.019 kg).  General appearance: alert and no distress Neck: no adenopathy, no carotid bruit, no JVD, supple, symmetrical, trachea midline and thyroid not enlarged, symmetric, no tenderness/mass/nodules Lungs: clear to auscultation bilaterally Heart: regular rate and rhythm, S1, S2 normal, no murmur, click, rub or gallop Extremities: extremities normal, atraumatic, no cyanosis or edema and 2+ pedal pulses bilaterally  EKG normal sinus rhythm at 71 with left axis deviation and T wave inversion in leads III and F  ASSESSMENT AND PLAN:   DYSLIPIDEMIA Not on statin therapy. Followed by his PCP  HYPERTENSION, ESSENTIAL NOS Well-controlled on current medications  Abdominal aortic aneurysm Found on physical exam and duplex ultrasound. It sounds like he's been referred to a vascular surgeon for further evaluation  Irregular heart rate Apparently noted during physical exam. He is entirely asymptomatic from this. I'm going to get a 30 day event monitor to further characterize      Lorretta Harp MD [facility], [facility] 01/15/2014 10:48 AM

## 2014-01-18 ENCOUNTER — Encounter: Payer: Self-pay | Admitting: Cardiovascular Disease

## 2014-01-18 ENCOUNTER — Ambulatory Visit: Payer: Self-pay | Admitting: Cardiovascular Disease

## 2014-01-19 ENCOUNTER — Encounter: Payer: Self-pay | Admitting: Vascular Surgery

## 2014-01-20 ENCOUNTER — Ambulatory Visit (INDEPENDENT_AMBULATORY_CARE_PROVIDER_SITE_OTHER): Payer: BC Managed Care – PPO | Admitting: Vascular Surgery

## 2014-01-20 ENCOUNTER — Other Ambulatory Visit: Payer: Self-pay | Admitting: *Deleted

## 2014-01-20 ENCOUNTER — Encounter: Payer: Self-pay | Admitting: Vascular Surgery

## 2014-01-20 VITALS — BP 123/84 | HR 96 | Resp 18 | Ht 75.0 in | Wt 168.0 lb

## 2014-01-20 DIAGNOSIS — I739 Peripheral vascular disease, unspecified: Secondary | ICD-10-CM

## 2014-01-20 DIAGNOSIS — I714 Abdominal aortic aneurysm, without rupture, unspecified: Secondary | ICD-10-CM | POA: Insufficient documentation

## 2014-01-20 DIAGNOSIS — I70219 Atherosclerosis of native arteries of extremities with intermittent claudication, unspecified extremity: Secondary | ICD-10-CM

## 2014-01-20 NOTE — Addendum Note (Signed)
Addended by: Mena Goes on: 01/20/2014 03:10 PM   Modules accepted: Orders

## 2014-01-20 NOTE — Progress Notes (Signed)
[facility] [place] HISTORY AND PHYSICAL   History of Present Illness:  Patient is a 63 y.o. year old male who presents for evaluation of 4.7 cm AAA.  He denies abdominal or back pain.  The aneurysm was found on screening US.  He has no family history of AAA. He currently smokes 1/2 PPD of cigarettes.  Greater than 3 minutes today spent regarding smoking cessation.  He complains of bilateral leg/calf pain after walking a few blocks.  This is chronic.  It is relieved by rest.  He has lost 25 lbs in the last year which he attributes to more walking.  He denies rest pain or non healing wounds on the feet.  Other medical problems include hypertension, elevated cholesterol and an irregular heartbeat currently under investigation.  Past Medical History  Diagnosis Date  . Hypertension   . Hyperchloremia   . AAA (abdominal aortic aneurysm)   . Hyperlipidemia   . Vitamin D deficiency   . Irregular heartbeat     Past Surgical History  Procedure Laterality Date  . Hernia repair      Social History  Currently smoking 1/2 PPD  Family History No family history on file.  Allergies  Allergies  Allergen Reactions  . Aspirin     REACTION: upset stomach     Current Outpatient Prescriptions  Medication Sig Dispense Refill  . aspirin 81 MG tablet Take 81 mg by mouth daily.      Marland Kitchen losartan-hydrochlorothiazide (HYZAAR) 100-12.5 MG per tablet Take 1 tablet by mouth daily.      . sildenafil (VIAGRA) 100 MG tablet Take 100 mg by mouth daily as needed for erectile dysfunction.       No current facility-administered medications for this visit.    ROS:   General:  + weight loss,no  Fever, no chills  HEENT: No recent headaches, no nasal bleeding, no visual changes, no sore throat  Neurologic: No dizziness, blackouts, seizures. No recent symptoms of stroke or mini- stroke. No recent episodes of slurred speech, or temporary blindness.  Cardiac: No recent episodes of chest  pain/pressure, no shortness of breath at rest.  No shortness of breath with exertion. + irregular heartbeat  Vascular: No history of rest pain in feet.  + history of claudication.  No history of non-healing ulcer, No history of DVT   Pulmonary: No home oxygen, no productive cough, no hemoptysis,  No asthma or wheezing  Musculoskeletal:  [ ]  Arthritis, [ ]  Low back pain,  [ ]  Joint pain  Hematologic:No history of hypercoagulable state.  No history of easy bleeding.  No history of anemia  Gastrointestinal: No hematochezia or melena,  No gastroesophageal reflux, no trouble swallowing  Urinary: [ ]  chronic Kidney disease, [ ]  on HD - [ ]  MWF or [ ]  TTHS, [ ]  Burning with urination, [ ]  Frequent urination, [ ]  Difficulty urinating;   Skin: No rashes  Psychological: No history of anxiety,  No history of depression   Physical Examination  Filed Vitals:   01/20/14 1300  BP: 123/84  Pulse: 96  Resp: 18  Height: 6\' 3"  (1.905 m)  Weight: 168 lb (76.204 kg)    Body mass index is 21 kg/(m^2).  General:  Alert and oriented, no acute distress HEENT: Normal Neck: No bruit or JVD Pulmonary: Clear to auscultation bilaterally Cardiac: Regular Rate and Rhythm without murmur Abdomen: Soft, non-tender, non-distended, pulsatile epigastric mass consistent with AAA Skin: No rash Extremity Pulses:  2+ radial,  brachial, femoral,absent popliteal dorsalis pedis, posterior tibial pulses bilaterally Musculoskeletal: No deformity or edema  Neurologic: Upper and lower extremity motor 5/5 and symmetric  DATA:  AAA ultrasound dated 12/25/13  from [place].  4.7 cm AAA.   ASSESSMENT:  Asymptomatic AAA 4.7 cm.  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   PLAN:  Quit smoking.  Follow up AAA [place] in 6 months.  Consider repair at 5-5.5 cm.  Claudication will get ABI at next visit. Consider agram if pt feels lifestyle limiting.  He currently is not at risk of limb  loss with no rest pain or tissue loss.  Ruta Hinds, MD [facility] [place] Office: [phone] Pager: [phone]

## 2014-02-05 ENCOUNTER — Telehealth: Payer: Self-pay | Admitting: Cardiovascular Disease

## 2014-02-05 NOTE — Telephone Encounter (Signed)
Informed patient the end of his enrollment is 02/13/14 (30 day).  States he is running low on electrodes and batteries.  Instructed to call [facility] and they will send out supplies.  Patient voiced understanding.

## 2014-02-05 NOTE — Telephone Encounter (Signed)
Pt wants to know how long is he suppose to wear his monitor?

## 2014-03-05 ENCOUNTER — Telehealth: Payer: Self-pay | Admitting: *Deleted

## 2014-03-05 NOTE — Telephone Encounter (Signed)
Message copied by Chauncy Lean on Fri Mar 05, 2014  1:46 PM ------      Message from: Lorretta Harp      Created: Sun Feb 28, 2014  4:37 PM       SR/ST, PACs/PVCs, PSVT. Needs ROV to discuss ------

## 2014-03-08 NOTE — Telephone Encounter (Signed)
Ov 10/26 with Dr Gwenlyn Found.

## 2014-03-22 ENCOUNTER — Encounter: Payer: Self-pay | Admitting: Cardiovascular Disease

## 2014-03-22 ENCOUNTER — Ambulatory Visit (INDEPENDENT_AMBULATORY_CARE_PROVIDER_SITE_OTHER): Payer: BC Managed Care – PPO | Admitting: Cardiovascular Disease

## 2014-03-22 VITALS — BP 112/72 | HR 76 | Ht 75.0 in | Wt 172.6 lb

## 2014-03-22 DIAGNOSIS — I499 Cardiac arrhythmia, unspecified: Secondary | ICD-10-CM

## 2014-03-22 NOTE — Patient Instructions (Signed)
Please follow up with Dr. Gwenlyn Found as needed.

## 2014-03-22 NOTE — Progress Notes (Signed)
[name] returns today for follow-up of his 1 month event monitor ordered to evaluate an irregular heart rate and palpitations. This showed PACs, PVCs, atrial runs and sinus tachycardia all of which he is asymptomatic from. I do not think this requires further evaluation. He does have a moderate size abdominal aortic aneurysm followed by Dr. Juanda Crum[place]. Today he described symptoms compatible with claudication which Dr. Oneida Alar may want to follow-up on. If he has an open aneurysm repair he would benefit from cardiovascular risk stratification with a pharmacologic Myoview stress test. Otherwise, I will see him back as needed   Lorretta Harp, M.D., FACP, [facility], [place] [place]. [place], [place]  [id number]  [phone] 03/22/2014 4:08 PM

## 2014-03-22 NOTE — Assessment & Plan Note (Signed)
A1 month event monitor was obtained that showed PACs, PVCs, H (in sinus tachycardia. There is no atrial fibrillation. The patient is completely asymptomatic from these. No further workup is required at this time.

## 2014-07-22 ENCOUNTER — Other Ambulatory Visit (HOSPITAL_COMMUNITY): Payer: BC Managed Care – PPO

## 2014-07-22 ENCOUNTER — Ambulatory Visit: Payer: BC Managed Care – PPO | Admitting: Family

## 2014-07-28 ENCOUNTER — Encounter: Payer: Self-pay | Admitting: Family

## 2014-07-29 ENCOUNTER — Ambulatory Visit (INDEPENDENT_AMBULATORY_CARE_PROVIDER_SITE_OTHER)
Admission: RE | Admit: 2014-07-29 | Discharge: 2014-07-29 | Disposition: A | Discharge status: Home / Self Care | Payer: No Typology Code available for payment source | Source: Ambulatory Visit | Attending: Vascular Surgery | Admitting: Vascular Surgery

## 2014-07-29 ENCOUNTER — Ambulatory Visit (INDEPENDENT_AMBULATORY_CARE_PROVIDER_SITE_OTHER)
Admission: RE | Admit: 2014-07-29 | Discharge: 2014-07-29 | Disposition: A | Discharge status: Home / Self Care | Payer: No Typology Code available for payment source | Source: Ambulatory Visit | Attending: Family | Admitting: Family

## 2014-07-29 ENCOUNTER — Encounter: Payer: Self-pay | Admitting: Family

## 2014-07-29 ENCOUNTER — Ambulatory Visit (INDEPENDENT_AMBULATORY_CARE_PROVIDER_SITE_OTHER): Payer: No Typology Code available for payment source | Admitting: Family

## 2014-07-29 ENCOUNTER — Ambulatory Visit (HOSPITAL_COMMUNITY)
Admission: RE | Admit: 2014-07-29 | Discharge: 2014-07-29 | Disposition: A | Discharge status: Home / Self Care | Payer: No Typology Code available for payment source | Source: Ambulatory Visit | Attending: Family | Admitting: Family

## 2014-07-29 VITALS — BP 145/99 | HR 75 | Resp 16 | Ht 75.0 in | Wt 170.0 lb

## 2014-07-29 DIAGNOSIS — Z720 Tobacco use: Secondary | ICD-10-CM

## 2014-07-29 DIAGNOSIS — I739 Peripheral vascular disease, unspecified: Secondary | ICD-10-CM

## 2014-07-29 DIAGNOSIS — I714 Abdominal aortic aneurysm, without rupture, unspecified: Secondary | ICD-10-CM

## 2014-07-29 DIAGNOSIS — F17200 Nicotine dependence, unspecified, uncomplicated: Secondary | ICD-10-CM

## 2014-07-29 DIAGNOSIS — I70219 Atherosclerosis of native arteries of extremities with intermittent claudication, unspecified extremity: Secondary | ICD-10-CM

## 2014-07-29 NOTE — Patient Instructions (Signed)
Abdominal Aortic Aneurysm An aneurysm is a weakened or damaged part of an artery wall that bulges from the normal force of blood pumping through the body. An abdominal aortic aneurysm is an aneurysm that occurs in the lower part of the aorta, the main artery of the body.  The major concern with an abdominal aortic aneurysm is that it can enlarge and burst (rupture) or blood can flow between the layers of the wall of the aorta through a tear (aorticdissection). Both of these conditions can cause bleeding inside the body and can be life threatening unless diagnosed and treated promptly. CAUSES  The exact cause of an abdominal aortic aneurysm is unknown. Some contributing factors are:   A hardening of the arteries caused by the buildup of fat and other substances in the lining of a blood vessel (arteriosclerosis).  Inflammation of the walls of an artery (arteritis).   Connective tissue diseases, such as Marfan syndrome.   Abdominal trauma.   An infection, such as syphilis or staphylococcus, in the wall of the aorta (infectious aortitis) caused by bacteria. RISK FACTORS  Risk factors that contribute to an abdominal aortic aneurysm may include:  Age older than 60 years.   High blood pressure (hypertension).  Male gender.  Ethnicity (white race).  Obesity.  Family history of aneurysm (first degree relatives only).  Tobacco use. PREVENTION  The following healthy lifestyle habits may help decrease your risk of abdominal aortic aneurysm:  Quitting smoking. Smoking can raise your blood pressure and cause arteriosclerosis.  Limiting or avoiding alcohol.  Keeping your blood pressure, blood sugar level, and cholesterol levels within normal limits.  Decreasing your salt intake. In somepeople, too much salt can raise blood pressure and increase your risk of abdominal aortic aneurysm.  Eating a diet low in saturated fats and cholesterol.  Increasing your fiber intake by including  whole grains, vegetables, and fruits in your diet. Eating these foods may help lower blood pressure.  Maintaining a healthy weight.  Staying physically active and exercising regularly. SYMPTOMS  The symptoms of abdominal aortic aneurysm may vary depending on the size and rate of growth of the aneurysm.Most grow slowly and do not have any symptoms. When symptoms do occur, they may include:  Pain (abdomen, side, lower back, or groin). The pain may vary in intensity. A sudden onset of severe pain may indicate that the aneurysm has ruptured.  Feeling full after eating only small amounts of food.  Nausea or vomiting or both.  Feeling a pulsating lump in the abdomen.  Feeling faint or passing out. DIAGNOSIS  Since most unruptured abdominal aortic aneurysms have no symptoms, they are often discovered during diagnostic exams for other conditions. An aneurysm may be found during the following procedures:  Ultrasonography (A one-time screening for abdominal aortic aneurysm by ultrasonography is also recommended for all men aged 65-75 years who have ever smoked).  X-ray exams.  A computed tomography (CT).  Magnetic resonance imaging (MRI).  Angiography or arteriography. TREATMENT  Treatment of an abdominal aortic aneurysm depends on the size of your aneurysm, your age, and risk factors for rupture. Medication to control blood pressure and pain may be used to manage aneurysms smaller than 6 cm. Regular monitoring for enlargement may be recommended by your caregiver if:  The aneurysm is 3-4 cm in size (an annual ultrasonography may be recommended).  The aneurysm is 4-4.5 cm in size (an ultrasonography every 6 months may be recommended).  The aneurysm is larger than 4.5 cm in   size (your caregiver may ask that you be examined by a vascular surgeon). If your aneurysm is larger than 6 cm, surgical repair may be recommended. There are two main methods for repair of an aneurysm:   Endovascular  repair (a minimally invasive surgery). This is done most often.  Open repair. This method is used if an endovascular repair is not possible. Document Released: 02/21/2005 Document Revised: 09/08/2012 Document Reviewed: 06/13/2012 [employer] Patient Information 2015 [employer]. This information is not intended to replace advice given to you by your health care provider. Make sure you discuss any questions you have with your health care provider.    Peripheral Vascular Disease Peripheral Vascular Disease (PVD), also called Peripheral Arterial Disease (PAD), is a circulation problem caused by cholesterol (atherosclerotic plaque) deposits in the arteries. PVD commonly occurs in the lower extremities (legs) but it can occur in other areas of the body, such as your arms. The cholesterol buildup in the arteries reduces blood flow which can cause pain and other serious problems. The presence of PVD can place a person at risk for Coronary Artery Disease (CAD).  CAUSES  Causes of PVD can be many. It is usually associated with more than one risk factor such as:   High Cholesterol.  Smoking.  Diabetes.  Lack of exercise or inactivity.  High blood pressure (hypertension).  Obesity.  Family history. SYMPTOMS   When the lower extremities are affected, patients with PVD may experience:  Leg pain with exertion or physical activity. This is called INTERMITTENT CLAUDICATION. This may present as cramping or numbness with physical activity. The location of the pain is associated with the level of blockage. For example, blockage at the abdominal level (distal abdominal aorta) may result in buttock or hip pain. Lower leg arterial blockage may result in calf pain.  As PVD becomes more severe, pain can develop with less physical activity.  In people with severe PVD, leg pain may occur at rest.  Other PVD signs and symptoms:  Leg numbness or weakness.  Coldness in the affected leg or foot,  especially when compared to the other leg.  A change in leg color.  Patients with significant PVD are more prone to ulcers or sores on toes, feet or legs. These may take longer to heal or may reoccur. The ulcers or sores can become infected.  If signs and symptoms of PVD are ignored, gangrene may occur. This can result in the loss of toes or loss of an entire limb.  Not all leg pain is related to PVD. Other medical conditions can cause leg pain such as:  Blood clots (embolism) or Deep Vein Thrombosis.  Inflammation of the blood vessels (vasculitis).  Spinal stenosis. DIAGNOSIS  Diagnosis of PVD can involve several different types of tests. These can include:  Pulse Volume Recording Method (PVR). This test is simple, painless and does not involve the use of X-rays. PVR involves measuring and comparing the blood pressure in the arms and legs. An ABI (Ankle-Brachial Index) is calculated. The normal ratio of blood pressures is 1. As this number becomes smaller, it indicates more severe disease.  < 0.95 - indicates significant narrowing in one or more leg vessels.  <0.8 - there will usually be pain in the foot, leg or buttock with exercise.  <0.4 - will usually have pain in the legs at rest.  <0.25 - usually indicates limb threatening PVD.  Doppler detection of pulses in the legs. This test is painless and checks to see   if you have a pulses in your legs/feet.  A dye or contrast material (a substance that highlights the blood vessels so they show up on x-ray) may be given to help your caregiver better see the arteries for the following tests. The dye is eliminated from your body by the kidney's. Your caregiver may order blood work to check your kidney function and other laboratory values before the following tests are performed:  Magnetic Resonance Angiography (MRA). An MRA is a picture study of the blood vessels and arteries. The MRA machine uses a large magnet to produce images of the  blood vessels.  Computed Tomography Angiography (CTA). A CTA is a specialized x-ray that looks at how the blood flows in your blood vessels. An IV may be inserted into your arm so contrast dye can be injected.  Angiogram. Is a procedure that uses x-rays to look at your blood vessels. This procedure is minimally invasive, meaning a small incision (cut) is made in your groin. A small tube (catheter) is then inserted into the artery of your groin. The catheter is guided to the blood vessel or artery your caregiver wants to examine. Contrast dye is injected into the catheter. X-rays are then taken of the blood vessel or artery. After the images are obtained, the catheter is taken out. TREATMENT  Treatment of PVD involves many interventions which may include:  Lifestyle changes:  Quitting smoking.  Exercise.  Following a low fat, low cholesterol diet.  Control of diabetes.  Foot care is very important to the PVD patient. Good foot care can help prevent infection.  Medication:  Cholesterol-lowering medicine.  Blood pressure medicine.  Anti-platelet drugs.  Certain medicines may reduce symptoms of Intermittent Claudication.  Interventional/Surgical options:  Angioplasty. An Angioplasty is a procedure that inflates a balloon in the blocked artery. This opens the blocked artery to improve blood flow.  Stent Implant. A wire mesh tube (stent) is placed in the artery. The stent expands and stays in place, allowing the artery to remain open.  Peripheral Bypass Surgery. This is a surgical procedure that reroutes the blood around a blocked artery to help improve blood flow. This type of procedure may be performed if Angioplasty or stent implants are not an option. SEEK IMMEDIATE MEDICAL CARE IF:   You develop pain or numbness in your arms or legs.  Your arm or leg turns cold, becomes blue in color.  You develop redness, warmth, swelling and pain in your arms or legs. MAKE SURE YOU:    Understand these instructions.  Will watch your condition.  Will get help right away if you are not doing well or get worse. Document Released: 06/21/2004 Document Revised: 08/06/2011 Document Reviewed: 05/18/2008 Exit[employer] Patient Information 2015 [employer]. This information is not intended to replace advice given to you by your health care provider. Make sure you discuss any questions you have with your health care provider.    Smoking Cessation Quitting smoking is important to your health and has many advantages. However, it is not always easy to quit since nicotine is a very addictive drug. Oftentimes, people try 3 times or more before being able to quit. This document explains the best ways for you to prepare to quit smoking. Quitting takes hard work and a lot of effort, but you can do it. ADVANTAGES OF QUITTING SMOKING  You will live longer, feel better, and live better.  Your body will feel the impact of quitting smoking almost immediately.  Within 20 minutes, blood   pressure decreases. Your pulse returns to its normal level.  After 8 hours, carbon monoxide levels in the blood return to normal. Your oxygen level increases.  After 24 hours, the chance of having a heart attack starts to decrease. Your breath, hair, and body stop smelling like smoke.  After 48 hours, damaged nerve endings begin to recover. Your sense of taste and smell improve.  After 72 hours, the body is virtually free of nicotine. Your bronchial tubes relax and breathing becomes easier.  After 2 to 12 weeks, lungs can hold more air. Exercise becomes easier and circulation improves.  The risk of having a heart attack, stroke, cancer, or lung disease is greatly reduced.  After 1 year, the risk of coronary heart disease is cut in half.  After 5 years, the risk of stroke falls to the same as a nonsmoker.  After 10 years, the risk of lung cancer is cut in half and the risk of other cancers decreases  significantly.  After 15 years, the risk of coronary heart disease drops, usually to the level of a nonsmoker.  If you are pregnant, quitting smoking will improve your chances of having a healthy baby.  The people you live with, especially any children, will be healthier.  You will have extra money to spend on things other than cigarettes. QUESTIONS TO THINK ABOUT BEFORE ATTEMPTING TO QUIT You may want to talk about your answers with your health care provider.  Why do you want to quit?  If you tried to quit in the past, what helped and what did not?  What will be the most difficult situations for you after you quit? How will you plan to handle them?  Who can help you through the tough times? Your family? Friends? A health care provider?  What pleasures do you get from smoking? What ways can you still get pleasure if you quit? Here are some questions to ask your health care provider:  How can you help me to be successful at quitting?  What medicine do you think would be best for me and how should I take it?  What should I do if I need more help?  What is smoking withdrawal like? How can I get information on withdrawal? GET READY  Set a quit date.  Change your environment by getting rid of all cigarettes, ashtrays, matches, and lighters in your home, car, or work. Do not let people smoke in your home.  Review your past attempts to quit. Think about what worked and what did not. GET SUPPORT AND ENCOURAGEMENT You have a better chance of being successful if you have help. You can get support in many ways.  Tell your family, friends, and coworkers that you are going to quit and need their support. Ask them not to smoke around you.  Get individual, group, or telephone counseling and support. Programs are available at local hospitals and health centers. Call your local health department for information about programs in your area.  Spiritual beliefs and practices may help some  smokers quit.  Download a "quit meter" on your computer to keep track of quit statistics, such as how long you have gone without smoking, cigarettes not smoked, and money saved.  Get a self-help book about quitting smoking and staying off tobacco. LEARN NEW SKILLS AND BEHAVIORS  Distract yourself from urges to smoke. Talk to someone, go for a walk, or occupy your time with a task.  Change your normal routine. Take a different route   to work. Drink tea instead of coffee. Eat breakfast in a different place.  Reduce your stress. Take a hot bath, exercise, or read a book.  Plan something enjoyable to do every day. Reward yourself for not smoking.  Explore interactive web-based programs that specialize in helping you quit. GET MEDICINE AND USE IT CORRECTLY Medicines can help you stop smoking and decrease the urge to smoke. Combining medicine with the above behavioral methods and support can greatly increase your chances of successfully quitting smoking.  Nicotine replacement therapy helps deliver nicotine to your body without the negative effects and risks of smoking. Nicotine replacement therapy includes nicotine gum, lozenges, inhalers, nasal sprays, and skin patches. Some may be available over-the-counter and others require a prescription.  Antidepressant medicine helps people abstain from smoking, but how this works is unknown. This medicine is available by prescription.  Nicotinic receptor partial agonist medicine simulates the effect of nicotine in your brain. This medicine is available by prescription. Ask your health care provider for advice about which medicines to use and how to use them based on your health history. Your health care provider will tell you what side effects to look out for if you choose to be on a medicine or therapy. Carefully read the information on the package. Do not use any other product containing nicotine while using a nicotine replacement product.  RELAPSE OR  DIFFICULT SITUATIONS Most relapses occur within the first 3 months after quitting. Do not be discouraged if you start smoking again. Remember, most people try several times before finally quitting. You may have symptoms of withdrawal because your body is used to nicotine. You may crave cigarettes, be irritable, feel very hungry, cough often, get headaches, or have difficulty concentrating. The withdrawal symptoms are only temporary. They are strongest when you first quit, but they will go away within 10-14 days. To reduce the chances of relapse, try to:  Avoid drinking alcohol. Drinking lowers your chances of successfully quitting.  Reduce the amount of caffeine you consume. Once you quit smoking, the amount of caffeine in your body increases and can give you symptoms, such as a rapid heartbeat, sweating, and anxiety.  Avoid smokers because they can make you want to smoke.  Do not let weight gain distract you. Many smokers will gain weight when they quit, usually less than 10 pounds. Eat a healthy diet and stay active. You can always lose the weight gained after you quit.  Find ways to improve your mood other than smoking. FOR MORE INFORMATION  www.smokefree.gov  Document Released: 05/08/2001 Document Revised: 09/28/2013 Document Reviewed: 08/23/2011 [employer] Patient Information 2015 [employer]. This information is not intended to replace advice given to you by your health care provider. Make sure you discuss any questions you have with your health care provider.   Smoking Cessation, Tips for Success If you are ready to quit smoking, congratulations! You have chosen to help yourself be healthier. Cigarettes bring nicotine, tar, carbon monoxide, and other irritants into your body. Your lungs, heart, and blood vessels will be able to work better without these poisons. There are many different ways to quit smoking. Nicotine gum, nicotine patches, a nicotine inhaler, or nicotine nasal spray can  help with physical craving. Hypnosis, support groups, and medicines help break the habit of smoking. WHAT THINGS CAN I DO TO MAKE QUITTING EASIER?  Here are some tips to help you quit for good:  Pick a date when you will quit smoking completely. Tell all of your   friends and family about your plan to quit on that date.  Do not try to slowly cut down on the number of cigarettes you are smoking. Pick a quit date and quit smoking completely starting on that day.  Throw away all cigarettes.   Clean and remove all ashtrays from your home, work, and car.  On a card, write down your reasons for quitting. Carry the card with you and read it when you get the urge to smoke.  Cleanse your body of nicotine. Drink enough water and fluids to keep your urine clear or pale yellow. Do this after quitting to flush the nicotine from your body.  Learn to predict your moods. Do not let a bad situation be your excuse to have a cigarette. Some situations in your life might tempt you into wanting a cigarette.  Never have "just one" cigarette. It leads to wanting another and another. Remind yourself of your decision to quit.  Change habits associated with smoking. If you smoked while driving or when feeling stressed, try other activities to replace smoking. Stand up when drinking your coffee. Brush your teeth after eating. Sit in a different chair when you read the paper. Avoid alcohol while trying to quit, and try to drink fewer caffeinated beverages. Alcohol and caffeine may urge you to smoke.  Avoid foods and drinks that can trigger a desire to smoke, such as sugary or spicy foods and alcohol.  Ask people who smoke not to smoke around you.  Have something planned to do right after eating or having a cup of coffee. For example, plan to take a walk or exercise.  Try a relaxation exercise to calm you down and decrease your stress. Remember, you may be tense and nervous for the first 2 weeks after you quit, but  this will pass.  Find new activities to keep your hands busy. Play with a pen, coin, or rubber band. Doodle or draw things on paper.  Brush your teeth right after eating. This will help cut down on the craving for the taste of tobacco after meals. You can also try mouthwash.   Use oral substitutes in place of cigarettes. Try using lemon drops, carrots, cinnamon sticks, or chewing gum. Keep them handy so they are available when you have the urge to smoke.  When you have the urge to smoke, try deep breathing.  Designate your home as a nonsmoking area.  If you are a heavy smoker, ask your health care provider about a prescription for nicotine chewing gum. It can ease your withdrawal from nicotine.  Reward yourself. Set aside the cigarette money you save and buy yourself something nice.  Look for support from others. Join a support group or smoking cessation program. Ask someone at home or at work to help you with your plan to quit smoking.  Always ask yourself, "Do I need this cigarette or is this just a reflex?" Tell yourself, "Today, I choose not to smoke," or "I do not want to smoke." You are reminding yourself of your decision to quit.  Do not replace cigarette smoking with electronic cigarettes (commonly called e-cigarettes). The safety of e-cigarettes is unknown, and some may contain harmful chemicals.  If you relapse, do not give up! Plan ahead and think about what you will do the next time you get the urge to smoke. HOW WILL I FEEL WHEN I QUIT SMOKING? You may have symptoms of withdrawal because your body is used to nicotine (the addictive substance in   cigarettes). You may crave cigarettes, be irritable, feel very hungry, cough often, get headaches, or have difficulty concentrating. The withdrawal symptoms are only temporary. They are strongest when you first quit but will go away within 10-14 days. When withdrawal symptoms occur, stay in control. Think about your reasons for quitting.  Remind yourself that these are signs that your body is healing and getting used to being without cigarettes. Remember that withdrawal symptoms are easier to treat than the major diseases that smoking can cause.  Even after the withdrawal is over, expect periodic urges to smoke. However, these cravings are generally short lived and will go away whether you smoke or not. Do not smoke! WHAT RESOURCES ARE AVAILABLE TO HELP ME QUIT SMOKING? Your health care provider can direct you to community resources or hospitals for support, which may include:  Group support.  Education.  Hypnosis.  Therapy. Document Released: 02/10/2004 Document Revised: 09/28/2013 Document Reviewed: 10/30/2012 [employer] Patient Information 2015 [employer]. This information is not intended to replace advice given to you by your health care provider. Make sure you discuss any questions you have with your health care provider.   

## 2014-07-29 NOTE — Progress Notes (Signed)
[facility] [place]  Established Abdominal Aortic Aneurysm  History of Present Illness  Eric Barnett is a 65 y.o. (1950-07-06) male patient of Dr. Oneida Alar who presents for follow up evaluation of 4.7 cm AAA. He denies abdominal or back pain. The aneurysm was found on screening US. He has no family history of AAA.  Medical problems include hypertension, elevated cholesterol and an irregular heartbeat currently under medical management. He has early satiety for about 6 months, not worsening, no weight loss since last visit at which he was 168 pounds.  The patient denies claudication in legs with walking denies non healing wounds. He states that he had pain in his right calf after walking in the past, but this has resolved; he walks over 2 hours daily. The patient denies history of stroke or TIA symptoms. He admits to not taking ASA in the last couple of months, no reason, just ran out.   Pt Diabetic: No Pt smoker: smoker  (2-3 cigarettes/day, does not know when he started smoking)  Past Medical History  Diagnosis Date  . Hypertension   . Hyperchloremia   . AAA (abdominal aortic aneurysm)   . Hyperlipidemia   . Vitamin D deficiency   . Irregular heartbeat    Past Surgical History  Procedure Laterality Date  . Hernia repair     Social History History   Social History  . Marital Status: Divorced    Spouse Name: N/A  . Number of Children: N/A  . Years of Education: N/A   Occupational History  . Not on file.   Social History Main Topics  . Smoking status: Current Some Day Smoker  . Smokeless tobacco: Former Systems developer    Quit date: 01/20/1974  . Alcohol Use: No  . Drug Use: No  . Sexual Activity: Not on file   Other Topics Concern  . Not on file   Social History Narrative   Family History History reviewed. No pertinent family history.  Current Outpatient Prescriptions on File Prior to Visit  Medication Sig Dispense Refill  . aspirin 81 MG tablet  Take 81 mg by mouth as needed (Pt can take low dose).     Marland Kitchen losartan-hydrochlorothiazide (HYZAAR) 100-12.5 MG per tablet Take 1 tablet by mouth daily.     No current facility-administered medications on file prior to visit.   Allergies  Allergen Reactions  . Aspirin Nausea Only    REACTION :HIGH DOSE  Upset patients   stomach-     ROS: See HPI for pertinent positives and negatives.  Physical Examination  Filed Vitals:   07/29/14 1133  BP: 145/99  Pulse: 75  Resp: 16  Height: 6\' 3"  (1.905 m)  Weight: 170 lb (77.111 kg)  SpO2: 99%   Body mass index is 21.25 kg/(m^2).  General: A&O x 3, WD.  Pulmonary: Sym exp, fair air movt, CTAB, no rales, rhonchi, or wheezing.  Cardiac: RRR, Nl S1, S2, no detected murmur.   Carotid Bruits Right Left   Negative Negative   Aorta is faintly palpable Radial pulses are 2+ palpable and =                          VASCULAR EXAM:  LE Pulses Right Left       FEMORAL   palpable   palpable        POPLITEAL  not palpable   not palpable       POSTERIOR TIBIAL  not palpable   not palpable        DORSALIS PEDIS      ANTERIOR TIBIAL not palpable  not palpable      Gastrointestinal: soft, NTND, -G/R, - HSM, - masses palpated, - CVAT B.  Musculoskeletal: M/S 5/5 throughout, Extremities without ischemic changes.  Neurologic: CN 2-12 intact, Pain and light touch intact in extremities are intact except, Motor exam as listed above.   Non-Invasive Vascular Imaging  AAA Duplex (07/29/2014)  ABDOMINAL AORTA DUPLEX EVALUATION    INDICATION: Abdominal aortic aneurysm     PREVIOUS INTERVENTION(S): NA    DUPLEX EXAM:     LOCATION DIAMETER AP (cm) DIAMETER TRANSVERSE (cm) VELOCITIES (cm/sec)  Aorta Proximal 2.46 2.74 82  Aorta Mid 2.34 2.28 70  Aorta Distal 5.01 5.33 33  Right Common Iliac Artery 1.32 1.62 53  Left Common Iliac Artery  1.4 1.5 106    Previous max aortic diameter:  4.7 x 4.5(outside facility [facility]) Date: 12/25/2013     ADDITIONAL FINDINGS:     IMPRESSION: Abdominal aortic aneurysm present measuring 5.01cm AP x 5.33cm TRV, with non-occluding intramural thrombus present.    Compared to the previous exam:  Increase in diameter since previous study at outside facility on 12/25/2013.    LOWER EXTREMITY ARTERIAL DUPLEX EVALUATION    INDICATION:     PREVIOUS INTERVENTION(S):     DUPLEX EXAM:     RIGHT  LEFT   Peak Systolic Velocity (cm/s) Ratio (if abnormal) Waveform  Peak Systolic Velocity (cm/s) Ratio (if abnormal) Waveform  57  T Common Femoral Artery 63  T  101  T Deep Femoral Artery 108  T  147/0  Stenotic/ occluded Superficial Femoral Artery Proximal 77/23/0  T/M/ Occluded  0  Occluded Superficial Femoral Artery Mid 0  Occluded  88  M Superficial Femoral Artery Distal 71  M  21/20  M/M Popliteal Artery 19/28  M/M  25  M Posterior Tibial Artery Dist 21  M  18  M Anterior Tibial Artery Distal 53  M  26  M Peroneal Artery Distal 30  M  0.74/0.84 Today's ABI / TBI 0.84/0.75  NA Previous ABI / TBI (  ) NA    Waveform:    M - Monophasic       B - Biphasic       T - Triphasic  If Ankle Brachial Index (ABI) or Toe Brachial Index (TBI) performed, please see complete report     ADDITIONAL FINDINGS:     IMPRESSION: No color or spectral Doppler waveform present involving the bilateral proximal to mid/distal superficial femoral arteries suggestive of vessel occlusions. Remainder of the bilateral lower extremity arterial systems are patent and without hemodynamically significant stenosis.    Compared to the previous exam:  No previous study to compare.     Medical Decision Making  The patient is a 64 y.o. male who presents with asymptomatic AAA with increasing size of 0.63 cm in seven months; largest diameter today is 5.33 cm. He c/o right calf claudication at his last visit but states that has  resolved, he walks at least 2 hours daily, has no tissue loss.  Unfortunately he continues to smoke. The patient was counseled re smoking cessation and given several  free resources re smoking cessation.  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   Based on this patient's exam and diagnostic studies, and after discussing with Dr. Oneida Alar, the patient will be scheduled for CTA abdomen/pelvis in 2 weeks and follow up with Dr. Oneida Alar.   Consideration for repair of AAA would be made when the size is 5.5 cm, growth > 1 cm/yr, and symptomatic status.  I emphasized the importance of maximal medical management including strict control of blood pressure, blood glucose, and lipid levels, antiplatelet agents, obtaining regular exercise, and cessation of smoking.   The patient was given information about AAA including signs, symptoms, treatment, and how to minimize the risk of enlargement and rupture of aneurysms.    The patient was advised to call [phone] should the patient experience sudden onset abdominal or back pain.   Thank you for allowing [place] to participate in this patient's care.  Clemon Chambers, RN, MSN, FNP-C [facility] [place] Office: [facility] Physician: Oneida Alar  07/29/2014, 11:42 AM

## 2014-07-30 ENCOUNTER — Telehealth: Payer: Self-pay | Admitting: *Deleted

## 2014-07-30 ENCOUNTER — Telehealth: Payer: Self-pay | Admitting: Vascular Surgery

## 2014-07-30 ENCOUNTER — Other Ambulatory Visit: Payer: Self-pay | Admitting: *Deleted

## 2014-07-30 DIAGNOSIS — I714 Abdominal aortic aneurysm, without rupture, unspecified: Secondary | ICD-10-CM

## 2014-07-30 NOTE — Telephone Encounter (Signed)
-----   Message from Roy, NP sent at 07/29/2014  8:01 PM EST ----- Regarding: change in follow up Please cancel the 4 month follow up for ABI's, bilateral LE arterial Duplex, and AAA Duplex, cancel appointment with me at that time. Instead, please schedule pt for CTA abdomen/pelvis in 2 weeks and follow up with Dr. Oneida Alar.  Zigmund Daniel, Please call pt and let him know that after further discussion with Dr. Oneida Alar it is determined that his AAA has enlarged enough to warrant this test.  Thank you, Vinnie Level

## 2014-07-30 NOTE — Telephone Encounter (Signed)
I called Mr. Popoff and explained to him that we needed to schedule a CTA according to the note from [place]. He understands and is in agreement with this plan. Hinton Dyer will call him with the scheduling information. I verified his phone number and address.

## 2014-07-30 NOTE — Telephone Encounter (Addendum)
sched cta for 08/06/14 @ [place] to pt to inform him of appt and bloodwork and lm on his hm# as per pt's request  Faxed bloodwork order to [facility] [phone]  ----- Message from Gena Fray sent at 07/30/2014  9:04 AM EST ----- Regarding: FW: change in follow up   ----- Message -----    From: Viann Fish, NP    Sent: 07/29/2014   8:01 PM      To: Elam Dutch, MD, Rufina Falco, # Subject: change in follow up                            Please cancel the 4 month follow up for ABI's, bilateral LE arterial Duplex, and AAA Duplex, cancel appointment with me at that time. Instead, please schedule pt for CTA abdomen/pelvis in 2 weeks and follow up with Dr. Oneida Alar.  Zigmund Daniel, Please call pt and let him know that after further discussion with Dr. Oneida Alar it is determined that his AAA has enlarged enough to warrant this test.  Thank you, Vinnie Level

## 2014-08-03 ENCOUNTER — Other Ambulatory Visit: Payer: Self-pay | Admitting: Vascular Surgery

## 2014-08-03 LAB — CREATININE, SERUM: CREATININE: 1.5 mg/dL — AB (ref 0.50–1.35)

## 2014-08-03 LAB — BUN: BUN: 16 mg/dL (ref 6–23)

## 2014-08-06 ENCOUNTER — Ambulatory Visit
Admission: RE | Admit: 2014-08-06 | Discharge: 2014-08-06 | Disposition: A | Discharge status: Home / Self Care | Payer: No Typology Code available for payment source | Source: Ambulatory Visit | Attending: Vascular Surgery | Admitting: Vascular Surgery

## 2014-08-06 DIAGNOSIS — I714 Abdominal aortic aneurysm, without rupture, unspecified: Secondary | ICD-10-CM

## 2014-08-06 MED ORDER — IOPAMIDOL (ISOVUE-370) INJECTION 76%
75.0000 mL | Freq: Once | INTRAVENOUS | Status: AC | PRN
Start: 2014-08-06 — End: 2014-08-06
  Administered 2014-08-06: 75 mL via INTRAVENOUS

## 2014-08-10 ENCOUNTER — Ambulatory Visit (HOSPITAL_COMMUNITY)
Admission: RE | Admit: 2014-08-10 | Discharge: 2014-08-10 | Disposition: A | Discharge status: Home / Self Care | Payer: No Typology Code available for payment source | Source: Ambulatory Visit | Attending: Vascular Surgery | Admitting: Vascular Surgery

## 2014-08-10 ENCOUNTER — Encounter: Payer: Self-pay | Admitting: Vascular Surgery

## 2014-08-10 ENCOUNTER — Telehealth: Payer: Self-pay | Admitting: Vascular Surgery

## 2014-08-10 ENCOUNTER — Other Ambulatory Visit: Payer: Self-pay | Admitting: *Deleted

## 2014-08-10 DIAGNOSIS — E278 Other specified disorders of adrenal gland: Secondary | ICD-10-CM

## 2014-08-10 DIAGNOSIS — E279 Disorder of adrenal gland, unspecified: Secondary | ICD-10-CM | POA: Diagnosis not present

## 2014-08-10 MED ORDER — GADOBENATE DIMEGLUMINE 529 MG/ML IV SOLN
15.0000 mL | Freq: Once | INTRAVENOUS | Status: AC | PRN
  Administered 2014-08-10: 15 mL via INTRAVENOUS

## 2014-08-10 NOTE — Telephone Encounter (Signed)
Per [facility], pt to have MRA abd w/wo contrast prior to tomorrow's appt. Gave pt the following: MRA [address] 08/10/14 8:45 pm [phone] Pt will arrange transportation and verbalized instructions.

## 2014-08-11 ENCOUNTER — Other Ambulatory Visit: Payer: Self-pay

## 2014-08-11 ENCOUNTER — Encounter: Payer: Self-pay | Admitting: Vascular Surgery

## 2014-08-11 ENCOUNTER — Ambulatory Visit (INDEPENDENT_AMBULATORY_CARE_PROVIDER_SITE_OTHER): Payer: No Typology Code available for payment source | Admitting: Vascular Surgery

## 2014-08-11 VITALS — BP 140/98 | HR 91 | Resp 16 | Ht 75.0 in | Wt 166.0 lb

## 2014-08-11 DIAGNOSIS — E279 Disorder of adrenal gland, unspecified: Secondary | ICD-10-CM | POA: Diagnosis not present

## 2014-08-11 DIAGNOSIS — I714 Abdominal aortic aneurysm, without rupture, unspecified: Secondary | ICD-10-CM

## 2014-08-11 DIAGNOSIS — E278 Other specified disorders of adrenal gland: Secondary | ICD-10-CM

## 2014-08-11 NOTE — Progress Notes (Signed)
[facility] [place] HISTORY AND PHYSICAL   History of Present Illness:  Patient is a 64 y.o. year old male who presents for evaluation of enlarging abdominal aortic aneurysm.  The aneurysm was 4.7 cm August 2015 by ultrasound.  He denies abdominal or back pain.  The aneurysm was found on screening US.  He has no family history of AAA. He currently smokes 1/2 PPD of cigarettes.  He complains of bilateral leg/calf pain after walking a few blocks.  This is chronic.  It is relieved by rest.  He has lost 25 lbs in the last year which he attributes to more walking. He denies rest pain or non healing wounds on the feet.  Other medical problems include hypertension, elevated cholesterol and an irregular heartbeat currently under investigation. He denies any syncopal episodes. He does have a history of palpitations in the past and was seen by Dr. Gwenlyn Found for this in October 2015. He states that his blood pressure has been fairly well controlled over the last few years.   Past Medical History  Diagnosis Date  . Hypertension   . Hyperchloremia   . AAA (abdominal aortic aneurysm)   . Hyperlipidemia   . Vitamin D deficiency   . Irregular heartbeat     Past Surgical History  Procedure Laterality Date  . Hernia repair      Social History History  Substance Use Topics  . Smoking status: Current Some Day Smoker  . Smokeless tobacco: Former Systems developer    Quit date: 01/20/1974  . Alcohol Use: No    Family History No family history on file.  Allergies  Allergies  Allergen Reactions  . Aspirin Nausea Only    REACTION :HIGH DOSE  Upset patients   stomach-      Current Outpatient Prescriptions  Medication Sig Dispense Refill  . aspirin 81 MG tablet Take 81 mg by mouth as needed (Pt can take low dose).     Marland Kitchen losartan-hydrochlorothiazide (HYZAAR) 100-12.5 MG per tablet Take 1 tablet by mouth daily.     No current facility-administered medications for this visit.    ROS:    General:  No Fever, chills  HEENT: No recent headaches, no nasal bleeding, no visual changes, no sore throat  Neurologic: No dizziness, blackouts, seizures. No recent symptoms of stroke or mini- stroke. No recent episodes of slurred speech, or temporary blindness.  Cardiac: No recent episodes of chest pain/pressure, no shortness of breath at rest.  No shortness of breath with exertion.  Denies history of atrial fibrillation or irregular heartbeat  Vascular: No history of rest pain in feet.  No history of claudication.  No history of non-healing ulcer, No history of DVT   Pulmonary: No home oxygen, no productive cough, no hemoptysis,  No asthma or wheezing  Musculoskeletal:  [ ]  Arthritis, [ ]  Low back pain,  [ ]  Joint pain  Hematologic:No history of hypercoagulable state.  No history of easy bleeding.  No history of anemia  Gastrointestinal: No hematochezia or melena,  No gastroesophageal reflux, no trouble swallowing  Urinary: [ ]  chronic Kidney disease, [ ]  on HD - [ ]  MWF or [ ]  TTHS, [ ]  Burning with urination, [ ]  Frequent urination, [ ]  Difficulty urinating;   Skin: No rashes  Psychological: No history of anxiety,  No history of depression   Physical Examination  Filed Vitals:   08/11/14 1136  BP: 140/98  Pulse: 91  Resp: 16  Height: 6\' 3"  (1.905 m)  Weight: 166 lb (75.297 kg)    Body mass index is 20.75 kg/(m^2).  General:  Alert and oriented, no acute distress HEENT: Normal Neck: No bruit or JVD Pulmonary: Clear to auscultation bilaterally Cardiac: Regular Rate and Rhythm without murmur Abdomen: Soft, non-tender, non-distended, easily palpable pulsatile epigastric mass, no scars Skin: No rash Extremity Pulses:  2+ radial, brachial, femoral, absent dorsalis pedis, posterior tibial pulses bilaterally Musculoskeletal: No deformity or edema  Neurologic: Upper and lower extremity motor 5/5 and symmetric  DATA:  CT scans of the abdomen and pelvis is reviewed  today. Aneurysm is 5.5 centimeters in diameter. There is at least 2 cm of infrarenal neck. Renal artery is the lowest. No iliac component. There is some moderate iliac tortuosity. Bilateral adrenal masses.  MRA of the abdomen 6 cm lobulated left adrenal mass, 2 cm right adrenal mass   ASSESSMENT:  #1 abdominal aortic aneurysm 5.5 cm diameter should be a good candidate for Gore Excluder stent graft repair percutaneously we'll schedule in the near future if pheochromocytoma workup negative.  Aneurysm repair tentatively scheduled for early April pending endocrine workup                              #2 referral to Ezzie Dural with [facility] endocrinology for workup of his adrenal masses, will need to rule out pheochromocytoma prior to considering aneurysm repair                             #3 appointment with Dr. Quay Burow for cardiology risk stratification prior to aneurysm repair.  He may also need to wait on metanephrines prior to pharmacologic cardiac stress test                            #4 referral to Dr. Armandina Gemma for consideration of removal of left adrenal gland after workup is complete by endocrinology.  I spoke with him today and most likely the patient will need removal of the left adrenal gland but he would prefer to defer this time after his aneurysm repair is complete.   PLAN:  See above  Ruta Hinds, MD [facility] [place] Office: [phone] Pager: [phone]

## 2014-08-12 ENCOUNTER — Other Ambulatory Visit: Payer: Self-pay | Admitting: Internal Medicine

## 2014-08-12 DIAGNOSIS — C49A2 Gastrointestinal stromal tumor of stomach: Secondary | ICD-10-CM | POA: Insufficient documentation

## 2014-08-12 DIAGNOSIS — E278 Other specified disorders of adrenal gland: Secondary | ICD-10-CM

## 2014-08-12 NOTE — Addendum Note (Signed)
Addended by: Reola Calkins on: 08/12/2014 04:20 PM   Modules accepted: Orders

## 2014-08-13 ENCOUNTER — Other Ambulatory Visit: Payer: Self-pay | Admitting: Internal Medicine

## 2014-08-13 ENCOUNTER — Telehealth: Payer: Self-pay | Admitting: Internal Medicine

## 2014-08-13 DIAGNOSIS — E278 Other specified disorders of adrenal gland: Secondary | ICD-10-CM

## 2014-08-13 NOTE — Telephone Encounter (Signed)
Please read message below.  

## 2014-08-13 NOTE — Telephone Encounter (Signed)
Done - labs are in! Thank you, C

## 2014-08-13 NOTE — Telephone Encounter (Signed)
Pt cannot come here for lab on Monday. His insurance will only pay for these if he goes to [place]. Please change the orders to reflect resulting location as [facility] and fax them to [phone] the local [facility] for the pt. He has an appt with them for monday

## 2014-08-16 ENCOUNTER — Encounter (HOSPITAL_COMMUNITY): Payer: Self-pay | Admitting: Pharmacy Technician

## 2014-08-16 ENCOUNTER — Other Ambulatory Visit: Payer: No Typology Code available for payment source

## 2014-08-16 NOTE — Telephone Encounter (Signed)
Orders faxed to [facility] at [phone].

## 2014-08-16 NOTE — Progress Notes (Signed)
Orders faxed to [facility].

## 2014-08-17 ENCOUNTER — Encounter: Payer: Self-pay | Admitting: Cardiovascular Disease

## 2014-08-17 ENCOUNTER — Inpatient Hospital Stay (HOSPITAL_COMMUNITY): Admission: RE | Admit: 2014-08-17 | Payer: No Typology Code available for payment source | Source: Ambulatory Visit

## 2014-08-17 ENCOUNTER — Ambulatory Visit (INDEPENDENT_AMBULATORY_CARE_PROVIDER_SITE_OTHER): Payer: No Typology Code available for payment source | Admitting: Cardiovascular Disease

## 2014-08-17 VITALS — BP 142/88 | HR 81 | Ht 75.0 in | Wt 174.0 lb

## 2014-08-17 DIAGNOSIS — I714 Abdominal aortic aneurysm, without rupture, unspecified: Secondary | ICD-10-CM

## 2014-08-17 DIAGNOSIS — Z79899 Other long term (current) drug therapy: Secondary | ICD-10-CM | POA: Diagnosis not present

## 2014-08-17 DIAGNOSIS — I499 Cardiac arrhythmia, unspecified: Secondary | ICD-10-CM

## 2014-08-17 DIAGNOSIS — I10 Essential (primary) hypertension: Secondary | ICD-10-CM

## 2014-08-17 DIAGNOSIS — Z01818 Encounter for other preprocedural examination: Secondary | ICD-10-CM | POA: Diagnosis not present

## 2014-08-17 LAB — HEPATIC FUNCTION PANEL
ALT: 18 U/L (ref 0–53)
AST: 24 U/L (ref 0–37)
Albumin: 4.4 g/dL (ref 3.5–5.2)
Alkaline Phosphatase: 67 U/L (ref 39–117)
Bilirubin, Direct: 0.2 mg/dL (ref 0.0–0.3)
Indirect Bilirubin: 1 mg/dL (ref 0.2–1.2)
Total Bilirubin: 1.2 mg/dL (ref 0.2–1.2)
Total Protein: 6.8 g/dL (ref 6.0–8.3)

## 2014-08-17 LAB — LIPID PANEL
CHOL/HDL RATIO: 3.2 ratio
CHOLESTEROL: 200 mg/dL (ref 0–200)
HDL: 63 mg/dL (ref >=40)
LDL Cholesterol: 113 mg/dL — ABNORMAL HIGH (ref 0–99)
Triglycerides: 119 mg/dL (ref <150)
VLDL: 24 mg/dL (ref 0–40)

## 2014-08-17 NOTE — Assessment & Plan Note (Signed)
History of hyperlipidemia though not on a statin drug. I'm going to recheck a lipid and liver profile

## 2014-08-17 NOTE — Patient Instructions (Signed)
Dr Gwenlyn Found has ordered the following test(s) to be done: 1. Lexiscan Myoview - TO BE DONE THIS WEEK - For further information please visit [email or web address]. Please follow instruction sheet, as given. 2. Blood work - TO BE DONE FASTING THIS WEEK  Dr Gwenlyn Found wants you to follow-up in 6 months. You will receive a reminder letter in the mail one months in advance. If you don't receive a letter, please call our office to schedule the follow-up appointment.

## 2014-08-17 NOTE — Progress Notes (Signed)
08/17/2014 [place]one   [birth year]  [id number]  Primary Physician Benna Dunks, Luane School, NP Primary Cardiologist: Lorretta Harp MD Renae Gloss   HPI:  [name] is a 64 year old thin appearing divorced African American male with no children he works as a Special educational needs teacher. He is referred by Yisroel Ramming PA-C from [facility] urgent care for evaluation of an irregular heart rate. The patient's cardiovascular risk factor profile is notable for tobacco use having smoked 1 12:45 half pack per day for the last 40 years. History of hypertension as well as hyperlipidemia not on statin therapy. There is no family history of heart disease. He has never had a heart attack or stroke, denies chest pain, shortness of breath or palpitations. He apparently has an abdominal aortic aneurysm diagnosed by ultrasound. He has been referred to a vascular surgeon. Primary care physician appreciated an irregular heart rhythm on exam apparently it was he was referred here for further evaluation. He is unaware of palpitations and denies dizziness or presyncope. I performed a one-month event monitor that showed PACs, PVCs HO runs as low sinus tachycardia although she was a symptomatically from. He apparently scheduled to undergo a recatheterization procedure for his abdominal aortic aneurysm by Dr. Oneida Alar next week. I'm going to order a pharmacologic  Myoview stress test to risk stratify him.   Current Outpatient Prescriptions  Medication Sig Dispense Refill  . aspirin 81 MG tablet Take 81 mg by mouth as needed (Pt can take low dose).     Marland Kitchen losartan-hydrochlorothiazide (HYZAAR) 100-12.5 MG per tablet Take 1 tablet by mouth daily.     No current facility-administered medications for this visit.    Allergies  Allergen Reactions  . Aspirin Nausea Only    REACTION :HIGH DOSE  Upset patients   stomach-     History   Social History  . Marital Status: Single    Spouse Name: N/A  . Number of  Children: N/A  . Years of Education: N/A   Occupational History  . Not on file.   Social History Main Topics  . Smoking status: Current Some Day Smoker  . Smokeless tobacco: Former Systems developer    Quit date: 01/20/1974  . Alcohol Use: No  . Drug Use: No  . Sexual Activity: Not on file   Other Topics Concern  . Not on file   Social History Narrative     Review of Systems: General: negative for chills, fever, night sweats or weight changes.  Cardiovascular: negative for chest pain, dyspnea on exertion, edema, orthopnea, palpitations, paroxysmal nocturnal dyspnea or shortness of breath Dermatological: negative for rash Respiratory: negative for cough or wheezing Urologic: negative for hematuria Abdominal: negative for nausea, vomiting, diarrhea, bright red blood per rectum, melena, or hematemesis Neurologic: negative for visual changes, syncope, or dizziness All other systems reviewed and are otherwise negative except as noted above.    Blood pressure 142/88, pulse 81, height 6\' 3"  (1.905 m), weight 174 lb (78.926 kg).  General appearance: alert and no distress Neck: no adenopathy, no carotid bruit, no JVD, supple, symmetrical, trachea midline and thyroid not enlarged, symmetric, no tenderness/mass/nodules Lungs: clear to auscultation bilaterally Heart: regular rate and rhythm, S1, S2 normal, no murmur, click, rub or gallop Extremities: extremities normal, atraumatic, no cyanosis or edema  EKG normal sinus rhythm 81 with left axis deviation and nonspecific ST and T-wave changes. I personally reviewed this EKG  ASSESSMENT AND PLAN:   Irregular heart rate Patient had an  event monitor that showed PACs, PVCs and occasional atrial runs. He is a symptomatic from these.   Essential hypertension History of hypertension blood pressure measured at 142/88. He is on Hyzaar. Continue current meds at current dosing   DYSLIPIDEMIA History of hyperlipidemia though not on a statin drug. I'm  going to recheck a lipid and liver profile   Abdominal aortic aneurysm History of abdominal aortic aneurysm scheduled to undergo no revascularization by Dr. Eden Lathe next week. I'm going to order a frontal clot Myoview stress test to risk stratify him.       Lorretta Harp MD FACP,FACC,FAHA, [facility] 08/17/2014 11:47 AM

## 2014-08-17 NOTE — Assessment & Plan Note (Signed)
History of abdominal aortic aneurysm scheduled to undergo no revascularization by Dr. Eden Lathe next week. I'm going to order a frontal clot Myoview stress test to risk stratify him.

## 2014-08-17 NOTE — Assessment & Plan Note (Signed)
History of hypertension blood pressure measured at 142/88. He is on Hyzaar. Continue current meds at current dosing

## 2014-08-17 NOTE — Assessment & Plan Note (Signed)
Patient had an event monitor that showed PACs, PVCs and occasional atrial runs. He is a symptomatic from these.

## 2014-08-18 ENCOUNTER — Encounter: Payer: Self-pay | Admitting: Internal Medicine

## 2014-08-18 ENCOUNTER — Ambulatory Visit (INDEPENDENT_AMBULATORY_CARE_PROVIDER_SITE_OTHER): Payer: No Typology Code available for payment source | Admitting: Internal Medicine

## 2014-08-18 ENCOUNTER — Other Ambulatory Visit: Payer: Self-pay | Admitting: *Deleted

## 2014-08-18 VITALS — BP 122/96 | HR 76 | Temp 97.7°F | Resp 12 | Ht 74.0 in | Wt 170.8 lb

## 2014-08-18 DIAGNOSIS — E278 Other specified disorders of adrenal gland: Secondary | ICD-10-CM | POA: Diagnosis not present

## 2014-08-18 MED ORDER — DEXAMETHASONE 1 MG PO TABS: ORAL_TABLET | ORAL | Status: DC

## 2014-08-18 NOTE — Progress Notes (Addendum)
Patient ID: Eric Barnett, male   DOB: Jul 27, 1950, 64 y.o.   MRN: [id number]   HPI  Eric Barnett is a 64 y.o.-year-old male, referred by his PCP, Dr. Oneida Alar, for evaluation and management of bilateral adrenal incidentalomas.  Patient's adrenal masses were observed during imaging for an abdominal aortic aneurysm (4.7 x 5.2 x 5.2 cm). He will have surgery for this on 08/25/2014, and we need to find out if the adrenal masses are hormone-producing before the surgery.  I reviewed pt's previous adrenal images along with the patient: MRI (08/10/2014):  2.0 x 1.8 cm right adrenal gland nodule with signal loss on opposed phase imaging (series 8/image 45), characteristic of a benign adrenal adenoma.  5.0 x 6.6 x 5.5 cm lobulated mass in the left upper abdomen (series 3/image 16) which may arise exophytically from the left adrenal gland (series 7/image 16). No definite intracellular lipid to suggest lipid rich adenoma. No macroscopic fat to suggest adrenal myelolipoma. Lesion demonstrates mild progressive enhancement but is not hypervascular to suggest pheochromocytoma. No aggressive imaging features to suggest adrenal cortical carcinoma. 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  CT angio +/- contrast (30/03/2015):   There is a large (approximately 7.3 x 5.3 x 4.6 cm left adrenal gland mass (image 41, series 4; coronal image 60,  series 601) as well as a approximately 2.4 x 1.9 cm right adrenal gland nodule (image 49, series 4).  Pt describes that he has a long h/o HTN since teenager years. He is on Hyzaar. Skipped the med today. Usually BP is well controlled on this med. He remembers that was hospitalized in 1984 with very high BP.  He also had palpitations several months ago after he took a 5 hour energy drink. Not since then. However, per review of the chart, the patient was previously seen by PCP, will notice an irregular heart rhythm and he was referred to  cardiology. He wore an event monitor that showed PACs, PVCs and sinus tachycardia, from which she was asymptomatic. He will have a Myoview stress test before the surgery.  He also describes he has anxiety episodes when he is upset or when he feels he has more things to do that he can actually get to do. These episodes started >20 years ago. He feels he takes a long time to calm down after episodes. He can have up to 3 a day. No associated HA, nausea, blurry vision with the episodes. He tells me that he has been in prison ~7 years ago due to one of his anger/anxiety episodes.  He was on potassium supplements, not recently.  No weight loss/gain.  No h/o DM2 or prediabetes.  No h/o AMI/stroke.  ROS: Constitutional: no weight gain/loss, no fatigue, no subjective hyperthermia/hypothermia Eyes: no blurry vision, no xerophthalmia ENT: no sore throat, no nodules palpated in throat, no dysphagia/odynophagia, no hoarseness Cardiovascular: no CP/SOB/palpitations/leg swelling Respiratory: no cough/SOB Gastrointestinal: no N/V/D/C Musculoskeletal: no muscle/joint aches Skin: no rashes Neurological: no tremors/numbness/tingling/dizziness Psychiatric: no depression/+ anxiety  Past Medical History  Diagnosis Date  . Hypertension   . Hyperchloremia   . AAA (abdominal aortic aneurysm)   . Hyperlipidemia   . Vitamin D deficiency   . Irregular heartbeat    Past Surgical History  Procedure Laterality Date  . Hernia repair     History   Social History  . Marital Status: Single    Spouse Name: N/A   Occupational History  . n/a   Social History  Main Topics  . Smoking status: Current Some Day Smoker  . Smokeless tobacco: Former Systems developer    Quit date: 01/20/1974  . Alcohol Use: No  . Drug Use: No   Current Outpatient Prescriptions on File Prior to Visit  Medication Sig Dispense Refill  . losartan-hydrochlorothiazide (HYZAAR) 100-12.5 MG per tablet Take 1 tablet by mouth daily.    Marland Kitchen aspirin  81 MG tablet Take 81 mg by mouth as needed (Pt can take low dose).      No current facility-administered medications on file prior to visit.   Allergies  Allergen Reactions  . Aspirin Nausea Only    REACTION :HIGH DOSE  Upset patients   stomach-    No family history on file.  PE: BP 122/96 mmHg  Pulse 76  Temp(Src) 97.7 F (36.5 C) (Oral)  Resp 12  Ht _0  (1.88 m)  Wt 170 lb 12.8 oz (77.474 kg)  BMI 21.92 kg/m2  SpO2 99% Wt Readings from Last 3 Encounters:  08/18/14 170 lb 12.8 oz (77.474 kg)  08/17/14 174 lb (78.926 kg)  08/11/14 166 lb (75.297 kg)   Constitutional: Normal weight, in NAD Eyes: PERRLA, EOMI, no exophthalmos, no lid lag, no stare ENT: moist mucous membranes, no thyromegaly, no cervical lymphadenopathy Cardiovascular: RRR, No MRG Respiratory: CTA B Gastrointestinal: abdomen soft, NT, ND, BS+ Musculoskeletal: no deformities, strength intact in all 4 Skin: moist, warm, no rashes Neurological: no tremor with outstretched hands, DTR normal in all 4  ASSESSMENT: 1. Adrenal incidentaloma - bilateral  PLAN:  1. Patient with B adrenal nodules L>R discovered incidentally. - We discussed about the fact that there are 3 possible scenarios: - A nonfunctioning adrenal nodule - A functioning adrenal adenoma - which can hypersecrete catecholamines/metanephrines, cortisol, or aldosterone - Adrenal cancer/metastasis We do not have blood tests to check for cancer, but the best indicator is lack of change in size and appearance over time.  - To differentiate between a functioning and a nonfunctioning adrenal nodule, we'll need to rule out hypersecretion by checking the following tests  - dexamethasone suppression test to rule out Cushing syndrome (6% of adrenal incidentalomas) - If the cortisol level returns >5, will need 24h urine free cortisol - Plasma fractionated metanephrines and catecholamines to rule out pheochromocytoma (3% of adrenal incidentalomas). Because of  the higher suspicion for pheochromocytoma, I also advised him to do a urine collection for metanephrines. I explained to him in details how he should to the collection. He repeated the protocol back and appears to have understood how to do it. - Plasma renin activity and aldosterone level to rule out primary hyperaldosteronism (0.6% of adrenal incidentalomas) - I ordered the above tests today. I advised pt to take the dexamethasone tablet (1 mg total dose, sent to pharmacy) at 11 PM the night before coming to the lab to have a cortisol level drawn. I also added dexamethasone level. - We did discuss about the fact that we absolutely need the results of the above tests back before the surgery. If he does turn out to have pheochromocytoma, surgery can precipitate a pheochromocytoma crisis if not pretreated with alpha blockers. I ordered the labs above to be done stat. Patient will go to [facility] since his insurance covers this. (I initially thought that I could ordered the test and see the patient for an initial afterwards, however this was not allowed by the insurance, that the appointment today was scheduled urgently, this morning). - We discussed about the need for  an other CT scan in another year and then have him back for repeat hormonal testing and ordering a dedicated adrenal CT. - we discussed about f/u:   hormonal testing yearly for 5 years   CT scans yearly x1-2 - I explained all the above to the patient, and he agrees with the plan. - I will see him back in 3 mo; if he has hormonal hypersecretion, his upcoming surgery needs to be postponed. In case of pheochromocytoma, we will need to pretreat him with alpha blockers before surgery, and he may need to have both pheochromocytoma and aneurysm surgeries performed at the same time. I'm not sure if this is possible, but it would be ideal.  - time spent with the patient: 1 hour, of which >50% was spent in obtaining information about hissymptoms,  reviewing his previous labs, evaluations, and treatments, counseling him about her condition (please see the discussed topics above), and developing a plan to further investigate it. He had a number of questions which I addressed.   Component     Latest Ref Rng 08/18/2014 08/23/2014  Glucose     70 - 99 mg/dL 106 (H)   BUN     6 - 23 mg/dL 16   Creatinine     0.50 - 1.35 mg/dL 1.18   GFR calc non Af Amer     >90 mL/min 65   GFR calc Af Amer     >90 mL/min 75   BUN/Creatinine Ratio     10 - 22 14   Sodium     135 - 145 mmol/L 140   Potassium     3.5 - 5.1 mmol/L 4.0   Chloride     96 - 112 mmol/L 100   CO2     19 - 32 mmol/L 26   Calcium     8.4 - 10.5 mg/dL 10.0   Norepinephrine     0 - 874 pg/mL 621   Epinephrine     0 - 62 pg/mL <15   Dopamine     0 - 48 pg/mL <30   ALDOSTERONE     0.0 - 30.0 ng/dL 1.6   Renin      WILL FOLLOW   ALDOS/RENIN RATIO      WILL FOLLOW   Normetanephrine, Free     0 - 145 pg/mL 109   Metanephrine, Free     0 - 62 pg/mL 33   Cortisol (Dexamethasone suppression test - normal <5)       1.3   Normal potassium and aldosterone (renin pending) >> no sign of primary hyperaldosteronism. Normal Dexamethasone suppression test >> no sign of Cushing sd. Normal plasma metanephrines and catecholamines. Urinary metanephrines pending but he accidentally performed the collection over 3 days >> results likely not valid. However, in the setting of normal plasma cat's and met's >> very unlikely pheochromocytoma.  He is cleared for surgery from endocrine point of view.  Component     Latest Ref Rng 08/19/2014 08/20/2014 08/23/2014 08/23/2014         2:27 PM  8:30 AM 12:00 AM  8:30 AM  Normetanephrine, Ur     Undefined ug/L    235  Normetanephrine, 24H Ur     82 - 500 ug/24 hr    588 (H)  Metaneph Total, Ur     Undefined ug/L    108  Metanephrines, 24H Ur     45 - 290 ug/24 hr    270  Creatinine, Urine  22.0 - 328.0 mg/dL  100.1 92.2   Creatinine,  24H Ur     1000.0 - 2000.0 mg/24 hr  2502.5 (H) Comment    Pt performed a 3 day collection rather than 24 h >> Normetanephrine and creatinine higher. Will recheck in a year. We can cancel the appt in 3 mo and move it 1 year out.

## 2014-08-18 NOTE — Patient Instructions (Signed)
  Please go to [place] now.  Start urine collection tomorrow as we discussed.  Take the Dexamethasone 1 mg tablet at 11 pm On Sunday.  Please go back to [facility] at 8 am on Monday. Take the urine jug with you then.  Please come back for a follow-up appointment in 3 months

## 2014-08-19 ENCOUNTER — Ambulatory Visit (INDEPENDENT_AMBULATORY_CARE_PROVIDER_SITE_OTHER): Payer: No Typology Code available for payment source | Admitting: Radiology

## 2014-08-19 ENCOUNTER — Encounter (HOSPITAL_COMMUNITY)
Admission: RE | Admit: 2014-08-19 | Discharge: 2014-08-19 | Disposition: A | Discharge status: Home / Self Care | Payer: No Typology Code available for payment source | Source: Ambulatory Visit | Attending: Vascular Surgery | Admitting: Vascular Surgery

## 2014-08-19 ENCOUNTER — Encounter (HOSPITAL_COMMUNITY): Payer: Self-pay

## 2014-08-19 DIAGNOSIS — I10 Essential (primary) hypertension: Secondary | ICD-10-CM | POA: Diagnosis not present

## 2014-08-19 DIAGNOSIS — Z01818 Encounter for other preprocedural examination: Secondary | ICD-10-CM | POA: Diagnosis not present

## 2014-08-19 DIAGNOSIS — Z01812 Encounter for preprocedural laboratory examination: Secondary | ICD-10-CM | POA: Diagnosis not present

## 2014-08-19 LAB — URINE MICROSCOPIC-ADD ON

## 2014-08-19 LAB — CBC
HCT: 42.2 % (ref 39.0–52.0)
HEMOGLOBIN: 14.6 g/dL (ref 13.0–17.0)
MCH: 32.7 pg (ref 26.0–34.0)
MCHC: 34.6 g/dL (ref 30.0–36.0)
MCV: 94.4 fL (ref 78.0–100.0)
Platelets: 169 10*3/uL (ref 150–400)
RBC: 4.47 MIL/uL (ref 4.22–5.81)
RDW: 12.8 % (ref 11.5–15.5)
WBC: 4.9 10*3/uL (ref 4.0–10.5)

## 2014-08-19 LAB — TYPE AND SCREEN
ABO/RH(D): O POS
Antibody Screen: NEGATIVE

## 2014-08-19 LAB — URINALYSIS, ROUTINE W REFLEX MICROSCOPIC
BILIRUBIN URINE: NEGATIVE
Glucose, UA: NEGATIVE mg/dL
Ketones, ur: NEGATIVE mg/dL
Leukocytes, UA: NEGATIVE
Nitrite: NEGATIVE
PROTEIN: NEGATIVE mg/dL
Specific Gravity, Urine: 1.01 (ref 1.005–1.030)
UROBILINOGEN UA: 1 mg/dL (ref 0.0–1.0)
pH: 6 (ref 5.0–8.0)

## 2014-08-19 LAB — PROTIME-INR
INR: 1.07 (ref 0.00–1.49)
Prothrombin Time: 14.1 seconds (ref 11.6–15.2)

## 2014-08-19 LAB — COMPREHENSIVE METABOLIC PANEL
ALK PHOS: 66 U/L (ref 39–117)
ALT: 20 U/L (ref 0–53)
ANION GAP: 6 (ref 5–15)
AST: 33 U/L (ref 0–37)
Albumin: 4 g/dL (ref 3.5–5.2)
BUN: 17 mg/dL (ref 6–23)
CALCIUM: 9.8 mg/dL (ref 8.4–10.5)
CO2: 29 mmol/L (ref 19–32)
Chloride: 103 mmol/L (ref 96–112)
Creatinine, Ser: 1.22 mg/dL (ref 0.50–1.35)
GFR calc non Af Amer: 61 mL/min — ABNORMAL LOW (ref >90)
GFR, EST AFRICAN AMERICAN: 71 mL/min — AB (ref >90)
GLUCOSE: 167 mg/dL — AB (ref 70–99)
Potassium: 3.4 mmol/L — ABNORMAL LOW (ref 3.5–5.1)
Sodium: 138 mmol/L (ref 135–145)
TOTAL PROTEIN: 7 g/dL (ref 6.0–8.3)
Total Bilirubin: 1.3 mg/dL — ABNORMAL HIGH (ref 0.3–1.2)

## 2014-08-19 LAB — BASIC METABOLIC PANEL
BUN / CREAT RATIO: 14 (ref 10–22)
BUN: 16 mg/dL (ref 8–27)
CALCIUM: 10 mg/dL (ref 8.6–10.2)
CHLORIDE: 100 mmol/L (ref 97–108)
CO2: 26 mmol/L (ref 18–29)
CREATININE: 1.18 mg/dL (ref 0.76–1.27)
GFR calc Af Amer: 75 mL/min/{1.73_m2} (ref >59)
GFR calc non Af Amer: 65 mL/min/{1.73_m2} (ref >59)
GLUCOSE: 106 mg/dL — AB (ref 65–99)
Potassium: 4 mmol/L (ref 3.5–5.2)
SODIUM: 140 mmol/L (ref 134–144)

## 2014-08-19 LAB — BLOOD GAS, ARTERIAL
Acid-Base Excess: 3.4 mmol/L — ABNORMAL HIGH (ref 0.0–2.0)
BICARBONATE: 27.1 meq/L — AB (ref 20.0–24.0)
Drawn by: 42180
FIO2: 0.21 %
O2 Saturation: 98.7 %
PH ART: 7.458 — AB (ref 7.350–7.450)
PO2 ART: 122 mmHg — AB (ref 80.0–100.0)
Patient temperature: 98.6
TCO2: 28.3 mmol/L (ref 0–100)
pCO2 arterial: 38.9 mmHg (ref 35.0–45.0)

## 2014-08-19 LAB — ABO/RH: ABO/RH(D): O POS

## 2014-08-19 LAB — SURGICAL PCR SCREEN
MRSA, PCR: NEGATIVE
STAPHYLOCOCCUS AUREUS: NEGATIVE

## 2014-08-19 LAB — APTT: APTT: 24 s (ref 24–37)

## 2014-08-19 MED ORDER — TECHNETIUM TC 99M SESTAMIBI GENERIC - CARDIOLITE
11.0000 | Freq: Once | INTRAVENOUS | Status: AC | PRN
  Administered 2014-08-19: 11 via INTRAVENOUS

## 2014-08-19 MED ORDER — CHLORHEXIDINE GLUCONATE CLOTH 2 % EX PADS: 6.0000 | MEDICATED_PAD | Freq: Once | CUTANEOUS | Status: DC

## 2014-08-19 MED ORDER — TECHNETIUM TC 99M SESTAMIBI GENERIC - CARDIOLITE
33.0000 | Freq: Once | INTRAVENOUS | Status: AC | PRN
  Administered 2014-08-19: 33 via INTRAVENOUS

## 2014-08-19 MED ORDER — REGADENOSON 0.4 MG/5ML IV SOLN
0.4000 mg | Freq: Once | INTRAVENOUS | Status: AC
  Administered 2014-08-19: 0.4 mg via INTRAVENOUS

## 2014-08-19 NOTE — Progress Notes (Signed)
[address] [place] [address] [place], [place] [address] [phone]    Cardiology Nuclear Med Study  Eric Barnett is a 64 y.o. male     MRN : [id number]     DOB: 03-Dec-1950  Procedure Date: 08/19/2014  Nuclear Med Background Indication for Stress Test:  Evaluation for Ischemia and Surgical Clearance:Recatheterization for AAA by Dr. Oneida Alar on 08/25/14 History:  No known CAD Cardiac Risk Factors: Hypertension  Symptoms:  None indicated   Nuclear Pre-Procedure Caffeine/Decaff Intake:  None NPO After: 10:00pm   Lungs:  clear O2 Sat: 100% on room air. IV 0.9% NS with Angio Cath:  22g  IV Site: R Hand  IV Started by:  Matilde Haymaker, RN  Chest Size (in):  40 Cup Size: n/a  Height: 6\' 2"  (1.88 m)  Weight:  163 lb (73.936 kg)  BMI:  Body mass index is 20.92 kg/(m^2). Tech Comments:  n/a    Nuclear Med Study 1 or 2 day study: 1 day  Stress Test Type:  Treadmill/Lexiscan  Reading MD: n/a  Order Authorizing Provider:  Hyman Hopes  Resting Radionuclide: Technetium 74m Sestamibi  Resting Radionuclide Dose: 11.0 mCi   Stress Radionuclide:  Technetium 11m Sestamibi  Stress Radionuclide Dose: 33.0 mCi           Stress Protocol Rest HR: 75 Stress HR: 115  Rest BP: 151/99 Stress BP: 129/92  Exercise Time (min): n/a METS: n/a   Predicted Max HR: 156 bpm % Max HR: 73.72 bpm Rate Pressure Product: 16905   Dose of Adenosine (mg):  n/a Dose of Lexiscan: 0.4 mg  Dose of Atropine (mg): n/a Dose of Dobutamine: n/a mcg/kg/min (at max HR)  Stress Test Technologist: Glade Lloyd, BS-ES  Nuclear Technologist:  Annye Rusk, CNMT     Rest Procedure:  Myocardial perfusion imaging was performed at rest 45 minutes following the intravenous administration of Technetium 28m Sestamibi. Rest ECG: NSR-LVH  Stress Procedure:  The patient received IV Lexiscan 0.4 mg over 15-seconds with concurrent low level exercise and then Technetium 40m Sestamibi was injected  at 30-seconds while the patient continued walking one more minute.  Quantitative spect images were obtained after a 45-minute delay.  During the infusion of Lexiscan the patient complained of fatigue, slight SOB and headache.  These symptoms began to resolve in recovery.  Stress ECG: No significant change from baseline ECG  QPS Raw Data Images: Soft tissue (diaphragm, bowel activity) underlies heart.   Stress Images: Thinning in the inferior wall (base, mid), inferolateral wall (base, mid).  Otherwise normal perfusion.   Rest Images: Bowel activity underlies inferior wall, complicating interpretatiion.   Inferior, inferolateral changes probablely present Subtraction (SDS):  No evidence of ischemia. Transient Ischemic Dilatation (Normal <1.22):  1.03 Lung/Heart Ratio (Normal <0.45):  0.24  Quantitative Gated Spect Images QGS EDV:  140 ml QGS ESV:  73 ml  Impression Exercise Capacity:  Lexiscan with no exercise. BP Response:  Normal blood pressure response. Clinical Symptoms:  There is dyspnea. ECG Impression:  No significant ST segment change suggestive of ischemia. Comparison with Prior Nuclear Study: No previous nuclear study performed  Overall Impression:  Moderate region of inferior/inferolateral thinning consistent with soft tissue attenuation vs subendocardial scar  No significant ischemia.  Overall infermediate risk scan  Note interpretation complicated by underlying bowel activity.    LV Ejection Fraction: 48%.  LV Wall Motion: No regional wll motion abnomrality.   Consider echo to further define LVEF / wall motion.  Dorris Carnes

## 2014-08-19 NOTE — Pre-Procedure Instructions (Signed)
Eric Barnett  08/19/2014   Your procedure is scheduled on:  08/25/14  Report to [facility] Admitting at 8 AM.  Call this number if you have problems the morning of surgery: [phone]   Remember:   Do not eat food or drink liquids after midnight.   Take these medicines the morning of surgery with A SIP OF WATER: decadron   Do not wear jewelry, make-up or nail polish.  Do not wear lotions, powders, or perfumes. You may wear deodorant.  Do not shave 48 hours prior to surgery. Men may shave face and neck.  Do not bring valuables to the hospital.  [facility] is not responsible                  for any belongings or valuables.               Contacts, dentures or bridgework may not be worn into surgery.  Leave suitcase in the car. After surgery it may be brought to your room.  For patients admitted to the hospital, discharge time is determined by your                treatment team.               Patients discharged the day of surgery will not be allowed to drive  home.  Name and phone number of your driver: family  Special Instructions: Incentive Spirometry - Practice and bring it with you on the day of surgery.   Please read over the following fact sheets that you were given: Pain Booklet, Coughing and Deep Breathing, Blood Transfusion Information, MRSA Information and Surgical Site Infection Prevention

## 2014-08-20 ENCOUNTER — Other Ambulatory Visit: Payer: Self-pay | Admitting: Internal Medicine

## 2014-08-20 LAB — CATECHOLAMINES, FRACTIONATED, PLASMA
Epinephrine: <15 pg/mL (ref 0–62)
Norepinephrine: 621 pg/mL (ref 0–874)

## 2014-08-20 LAB — METANEPHRINES, PLASMA
Metanephrine, Free: 33 pg/mL (ref 0–62)
NORMETANEPHRINE FREE: 109 pg/mL (ref 0–145)

## 2014-08-23 ENCOUNTER — Telehealth: Payer: Self-pay | Admitting: Cardiovascular Disease

## 2014-08-23 LAB — CREATININE, URINE, 24 HOUR
Creatinine, 24H Ur: 2502.5 mg/24 hr — ABNORMAL HIGH (ref 1000.0–2000.0)
Creatinine, Urine: 100.1 mg/dL (ref 22.0–328.0)

## 2014-08-23 NOTE — Telephone Encounter (Signed)
The patient is cleared for his endoluminal stent graft at low cardiovascular risk given a low risk Myoview stress test.

## 2014-08-23 NOTE — Telephone Encounter (Signed)
Pt need Cardiac Braulio Conte for Evar on 08-25-14 please. Please fax to [phone] asap.

## 2014-08-23 NOTE — Telephone Encounter (Signed)
Dr Berry, please advise 

## 2014-08-23 NOTE — Progress Notes (Addendum)
Anesthesia Chart Review: Patient is a 64 year old male scheduled for EVAR AAA on 08/25/14 by Dr. Oneida Alar.  History includes smoking, 5.2 cm AAA, HTN, HLD, irregular HR without afib, bilateral adrenal nodules (incidental finding on MRI and CTA). Patient was sent to endocrinologist Gherghe for preoperative evaluation of his bilateral adrenal nodules in hopes to rule out a functioning adrenal adenoma (ie,  pheochromocytoma).  He was seen by cardiologist Dr. Quay Burow in 2015 for irregular HR and was seen on 08/16/13 for a pre-operative evaluation. Dr. Oneida Alar is also referring patient to [facility] Dr. Armandina Gemma for consideration of removal of left adrenal gland after work-up is completed by endocrinology.  PCP is listed as Everardo Beals, NP.   He had labs on 08/18/14.  Dr.  Arman Filter not indicates that she will add an addendum to his preoperative evaluation once she has reviewed all of the results.  Currently results showe aldosterone is 1.6, renin and aldosterone/renin ratio are still pending. Epinephrine < 15, norepinephrine 621, dopamine < 30, metanephrine 33, normetanephrine 109.   Meds include ASA, Hyzaar.   08/17/14 EKG: NSR, LAD, non-specific ST/T wave abnormality.  Nuclear stress test 08/19/14: Overall Impression: Moderate region of inferior/inferolateral thinning consistent with soft tissue attenuation vs subendocardial scar No significant ischemia. Overall infermediate risk scan Note interpretation complicated by underlying bowel activity.  LV Ejection Fraction: 48%. LV Wall Motion: No regional wll motion abnormality. Consider echo to further define LVEF / wall motion.   01/15/14-02/13/14 Event monitor showed SR/ST, PACs, PVCs, PSVT/atiral runs. Patient was asymptomatic so no specific therapy was recommended.  11/03/09 Echo: Left ventricle: The cavity size was normal. Wall thickness wasnormal. Systolic function was normal. The estimated ejection fraction was in the range of 60% to 65%. Wall  motion was normal; there were no regional wall motion abnormalities. Doppler parametersare consistent with abnormal left ventricular relaxation (grade 1diastolic dysfunction).    Preoperative labs noted.   Chart will be left for follow-up regarding final endocrinology and cardiology pre-operative input.  [facility] Short Stay Center/Anesthesiology Phone [phone] 08/23/2014 2:45 PM  Addendum: Update from Dr. Cruzita Lederer: Normal potassium and aldosterone (renin pending) >> no sign of primary hyperaldosteronism. Normal Dexamethasone suppression test >> no sign of Cushing sd. Normal plasma metanephrines and catecholamines. Urinary metanephrines pending but he accidentally performed the collection over 3 days >> results likely not valid. However, in the setting of normal plasma cat's and met's >> very unlikely pheochromocytoma. He is cleared for surgery from endocrine point of view.  Update from Dr. Gwenlyn Found: The patient is cleared for his endoluminal stent graft at low cardiovascular risk given a low risk Myoview stress test.  Surgery has been rescheduled for 08/30/14 due to scheduling changes.  [facility] Short Stay Center/Anesthesiology Phone [phone] 08/24/2014 9:46 AM

## 2014-08-23 NOTE — Telephone Encounter (Signed)
Routing to [place] to review.

## 2014-08-24 ENCOUNTER — Telehealth: Payer: Self-pay | Admitting: *Deleted

## 2014-08-24 DIAGNOSIS — Z79899 Other long term (current) drug therapy: Secondary | ICD-10-CM

## 2014-08-24 DIAGNOSIS — E785 Hyperlipidemia, unspecified: Secondary | ICD-10-CM

## 2014-08-24 LAB — CREATININE, URINE, 24 HOUR: Creatinine, Urine: 92.2 mg/dL (ref 22.0–328.0)

## 2014-08-24 LAB — CORTISOL: Cortisol: 1.3 ug/dL

## 2014-08-24 MED ORDER — ATORVASTATIN CALCIUM 40 MG PO TABS: 40.0000 mg | ORAL_TABLET | Freq: Every day | ORAL | Status: AC

## 2014-08-24 NOTE — Telephone Encounter (Signed)
Message routed to [facility] and [facility]

## 2014-08-24 NOTE — Telephone Encounter (Signed)
-----   Message from Lorretta Harp, MD sent at 08/22/2014  5:02 PM EDT ----- Start Atorva 40 mg after his ABFBG

## 2014-08-24 NOTE — Addendum Note (Signed)
Addended by: Philemon Kingdom on: 08/24/2014 08:08 AM   Modules accepted: Level of Service

## 2014-08-24 NOTE — Telephone Encounter (Signed)
Patient notified of results Chaplin sent to pharmacy and lab slip mailed to patient for recheck in 3 months

## 2014-08-25 LAB — ALDOSTERONE + RENIN ACTIVITY W/ RATIO: ALDOSTERONE: 1.6 ng/dL (ref 0.0–30.0)

## 2014-08-26 LAB — METANEPHRINES, URINE, 24 HOUR
METANEPHRINES 24H UR: 270 ug/(24.h) (ref 45–290)
Metaneph Total, Ur: 108 ug/L
NORMETANEPHRINE 24H UR: 588 ug/(24.h) — AB (ref 82–500)
Normetanephrine, Ur: 235 ug/L

## 2014-08-28 LAB — DEXAMETHASONE, BLOOD: DEXAMETHASONE, BLOOD: 138 ng/dL

## 2014-08-29 MED ORDER — DEXTROSE 5 % IV SOLN
1.5000 g | INTRAVENOUS | Status: AC
  Administered 2014-08-30: 1.5 g via INTRAVENOUS
  Filled 2014-08-29: qty 1.5

## 2014-08-30 ENCOUNTER — Inpatient Hospital Stay (HOSPITAL_COMMUNITY): Payer: No Typology Code available for payment source | Admitting: Vascular Surgery

## 2014-08-30 ENCOUNTER — Inpatient Hospital Stay (HOSPITAL_COMMUNITY)
Admission: RE | Admit: 2014-08-30 | Discharge: 2014-08-31 | DRG: 269 | Disposition: A | Discharge status: Home / Self Care | Payer: No Typology Code available for payment source | Source: Ambulatory Visit | Attending: Vascular Surgery | Admitting: Vascular Surgery

## 2014-08-30 ENCOUNTER — Encounter (HOSPITAL_COMMUNITY)
Admission: RE | Disposition: A | Discharge status: Home / Self Care | Payer: Self-pay | Source: Ambulatory Visit | Attending: Vascular Surgery

## 2014-08-30 ENCOUNTER — Inpatient Hospital Stay (HOSPITAL_COMMUNITY): Payer: No Typology Code available for payment source

## 2014-08-30 ENCOUNTER — Inpatient Hospital Stay (HOSPITAL_COMMUNITY): Payer: No Typology Code available for payment source | Admitting: Anesthesiology

## 2014-08-30 ENCOUNTER — Encounter (HOSPITAL_COMMUNITY): Payer: Self-pay | Admitting: *Deleted

## 2014-08-30 DIAGNOSIS — Z7982 Long term (current) use of aspirin: Secondary | ICD-10-CM | POA: Diagnosis not present

## 2014-08-30 DIAGNOSIS — I714 Abdominal aortic aneurysm, without rupture, unspecified: Secondary | ICD-10-CM

## 2014-08-30 DIAGNOSIS — D3500 Benign neoplasm of unspecified adrenal gland: Secondary | ICD-10-CM | POA: Diagnosis present

## 2014-08-30 DIAGNOSIS — E559 Vitamin D deficiency, unspecified: Secondary | ICD-10-CM | POA: Diagnosis present

## 2014-08-30 DIAGNOSIS — E780 Pure hypercholesterolemia: Secondary | ICD-10-CM | POA: Diagnosis present

## 2014-08-30 DIAGNOSIS — I10 Essential (primary) hypertension: Secondary | ICD-10-CM | POA: Diagnosis present

## 2014-08-30 DIAGNOSIS — IMO0002 Reserved for concepts with insufficient information to code with codable children: Secondary | ICD-10-CM

## 2014-08-30 DIAGNOSIS — T82330A Leakage of aortic (bifurcation) graft (replacement), initial encounter: Secondary | ICD-10-CM

## 2014-08-30 DIAGNOSIS — F17210 Nicotine dependence, cigarettes, uncomplicated: Secondary | ICD-10-CM | POA: Diagnosis present

## 2014-08-30 DIAGNOSIS — Z79899 Other long term (current) drug therapy: Secondary | ICD-10-CM

## 2014-08-30 DIAGNOSIS — E785 Hyperlipidemia, unspecified: Secondary | ICD-10-CM | POA: Diagnosis present

## 2014-08-30 HISTORY — PX: ABDOMINAL AORTIC ENDOVASCULAR STENT GRAFT: SHX5707

## 2014-08-30 HISTORY — DX: Abdominal aortic aneurysm, without rupture, unspecified: I71.40

## 2014-08-30 LAB — CBC
HCT: 37.9 % — ABNORMAL LOW (ref 39.0–52.0)
Hemoglobin: 13 g/dL (ref 13.0–17.0)
MCH: 31.8 pg (ref 26.0–34.0)
MCHC: 34.3 g/dL (ref 30.0–36.0)
MCV: 92.7 fL (ref 78.0–100.0)
Platelets: 168 10*3/uL (ref 150–400)
RBC: 4.09 MIL/uL — ABNORMAL LOW (ref 4.22–5.81)
RDW: 12.8 % (ref 11.5–15.5)
WBC: 5.5 10*3/uL (ref 4.0–10.5)

## 2014-08-30 LAB — BASIC METABOLIC PANEL
ANION GAP: 8 (ref 5–15)
BUN: 11 mg/dL (ref 6–23)
CHLORIDE: 105 mmol/L (ref 96–112)
CO2: 26 mmol/L (ref 19–32)
Calcium: 8.8 mg/dL (ref 8.4–10.5)
Creatinine, Ser: 0.91 mg/dL (ref 0.50–1.35)
GFR calc non Af Amer: 88 mL/min — ABNORMAL LOW (ref >90)
Glucose, Bld: 113 mg/dL — ABNORMAL HIGH (ref 70–99)
POTASSIUM: 3.6 mmol/L (ref 3.5–5.1)
Sodium: 139 mmol/L (ref 135–145)

## 2014-08-30 LAB — MAGNESIUM: Magnesium: 2 mg/dL (ref 1.5–2.5)

## 2014-08-30 SURGERY — INSERTION, ENDOVASCULAR STENT GRAFT, AORTA, ABDOMINAL
Anesthesia: General | Site: Abdomen

## 2014-08-30 MED ORDER — FENTANYL CITRATE 0.05 MG/ML IJ SOLN
INTRAMUSCULAR | Status: DC | PRN
  Administered 2014-08-30 (×2): 100 ug via INTRAVENOUS
  Administered 2014-08-30: 50 ug via INTRAVENOUS
  Administered 2014-08-30: 100 ug via INTRAVENOUS
  Administered 2014-08-30: 150 ug via INTRAVENOUS

## 2014-08-30 MED ORDER — FENTANYL CITRATE 0.05 MG/ML IJ SOLN
INTRAMUSCULAR | Status: AC
  Filled 2014-08-30: qty 5

## 2014-08-30 MED ORDER — ACETAMINOPHEN 325 MG PO TABS
325.0000 mg | ORAL_TABLET | ORAL | Status: DC | PRN
  Filled 2014-08-30: qty 2

## 2014-08-30 MED ORDER — ONDANSETRON HCL 4 MG/2ML IJ SOLN: 4.0000 mg | Freq: Once | INTRAMUSCULAR | Status: DC | PRN

## 2014-08-30 MED ORDER — LACTATED RINGERS IV SOLN
INTRAVENOUS | Status: DC
  Administered 2014-08-30: 08:00:00 via INTRAVENOUS

## 2014-08-30 MED ORDER — LOSARTAN POTASSIUM 50 MG PO TABS
100.0000 mg | ORAL_TABLET | Freq: Every day | ORAL | Status: DC
  Administered 2014-08-30 – 2014-08-31 (×2): 100 mg via ORAL
  Filled 2014-08-30 (×2): qty 2

## 2014-08-30 MED ORDER — MAGNESIUM SULFATE 2 GM/50ML IV SOLN: 2.0000 g | Freq: Every day | INTRAVENOUS | Status: DC | PRN

## 2014-08-30 MED ORDER — SUGAMMADEX SODIUM 500 MG/5ML IV SOLN
INTRAVENOUS | Status: DC | PRN
  Administered 2014-08-30: 300 mg via INTRAVENOUS

## 2014-08-30 MED ORDER — SUGAMMADEX SODIUM 500 MG/5ML IV SOLN
4.0000 mg/kg | INTRAVENOUS | Status: DC
  Filled 2014-08-30: qty 3

## 2014-08-30 MED ORDER — METOPROLOL TARTRATE 1 MG/ML IV SOLN
2.0000 mg | INTRAVENOUS | Status: DC | PRN
  Filled 2014-08-30: qty 5

## 2014-08-30 MED ORDER — HYDROMORPHONE HCL 1 MG/ML IJ SOLN: 0.2500 mg | INTRAMUSCULAR | Status: DC | PRN

## 2014-08-30 MED ORDER — ONDANSETRON HCL 4 MG/2ML IJ SOLN
INTRAMUSCULAR | Status: DC | PRN
  Administered 2014-08-30 (×2): 4 mg via INTRAVENOUS

## 2014-08-30 MED ORDER — DEXAMETHASONE 0.5 MG PO TABS
1.0000 mg | ORAL_TABLET | Freq: Every day | ORAL | Status: DC
  Administered 2014-08-30 – 2014-08-31 (×2): 1 mg via ORAL
  Filled 2014-08-30 (×2): qty 2

## 2014-08-30 MED ORDER — MIDAZOLAM HCL 2 MG/2ML IJ SOLN
INTRAMUSCULAR | Status: AC
  Filled 2014-08-30: qty 2

## 2014-08-30 MED ORDER — MORPHINE SULFATE 2 MG/ML IJ SOLN
2.0000 mg | INTRAMUSCULAR | Status: DC | PRN
  Administered 2014-08-30: 2 mg via INTRAVENOUS
  Filled 2014-08-30: qty 3

## 2014-08-30 MED ORDER — OXYCODONE HCL 5 MG PO TABS: 5.0000 mg | ORAL_TABLET | Freq: Once | ORAL | Status: DC | PRN

## 2014-08-30 MED ORDER — DEXTROSE 5 % IV SOLN
1.5000 g | Freq: Two times a day (BID) | INTRAVENOUS | Status: AC
  Administered 2014-08-30 – 2014-08-31 (×2): 1.5 g via INTRAVENOUS
  Filled 2014-08-30 (×2): qty 1.5

## 2014-08-30 MED ORDER — LIDOCAINE HCL (CARDIAC) 20 MG/ML IV SOLN
INTRAVENOUS | Status: DC | PRN
  Administered 2014-08-30: 100 mg via INTRAVENOUS

## 2014-08-30 MED ORDER — SODIUM CHLORIDE 0.9 % IV SOLN: INTRAVENOUS | Status: DC

## 2014-08-30 MED ORDER — SODIUM CHLORIDE 0.9 % IR SOLN
Status: DC | PRN
  Administered 2014-08-30 (×2): 500 mL

## 2014-08-30 MED ORDER — HYDRALAZINE HCL 20 MG/ML IJ SOLN
5.0000 mg | INTRAMUSCULAR | Status: DC | PRN
  Administered 2014-08-30: 5 mg via INTRAVENOUS
  Filled 2014-08-30 (×2): qty 0.25

## 2014-08-30 MED ORDER — ACETAMINOPHEN 325 MG RE SUPP
325.0000 mg | RECTAL | Status: DC | PRN
  Filled 2014-08-30: qty 2

## 2014-08-30 MED ORDER — SODIUM CHLORIDE 0.9 % IV SOLN
10.0000 mg | INTRAVENOUS | Status: DC | PRN
  Administered 2014-08-30: 20 ug/min via INTRAVENOUS

## 2014-08-30 MED ORDER — DOCUSATE SODIUM 100 MG PO CAPS
100.0000 mg | ORAL_CAPSULE | Freq: Every day | ORAL | Status: DC
  Administered 2014-08-31: 100 mg via ORAL
  Filled 2014-08-30: qty 1

## 2014-08-30 MED ORDER — BISACODYL 10 MG RE SUPP: 10.0000 mg | Freq: Every day | RECTAL | Status: DC | PRN

## 2014-08-30 MED ORDER — 0.9 % SODIUM CHLORIDE (POUR BTL) OPTIME
TOPICAL | Status: DC | PRN
Start: 2014-08-30 — End: 2014-08-30
  Administered 2014-08-30: 1000 mL

## 2014-08-30 MED ORDER — ROCURONIUM BROMIDE 100 MG/10ML IV SOLN
INTRAVENOUS | Status: DC | PRN
  Administered 2014-08-30: 50 mg via INTRAVENOUS

## 2014-08-30 MED ORDER — PHENOL 1.4 % MT LIQD: 1.0000 | OROMUCOSAL | Status: DC | PRN

## 2014-08-30 MED ORDER — PANTOPRAZOLE SODIUM 40 MG PO TBEC
40.0000 mg | DELAYED_RELEASE_TABLET | Freq: Every day | ORAL | Status: DC
  Administered 2014-08-30 – 2014-08-31 (×2): 40 mg via ORAL
  Filled 2014-08-30 (×2): qty 1

## 2014-08-30 MED ORDER — LACTATED RINGERS IV SOLN
INTRAVENOUS | Status: DC | PRN
  Administered 2014-08-30 (×2): via INTRAVENOUS

## 2014-08-30 MED ORDER — POTASSIUM CHLORIDE CRYS ER 20 MEQ PO TBCR
20.0000 meq | EXTENDED_RELEASE_TABLET | Freq: Every day | ORAL | Status: DC | PRN
Start: 2014-08-30 — End: 2014-08-31

## 2014-08-30 MED ORDER — LABETALOL HCL 5 MG/ML IV SOLN
10.0000 mg | INTRAVENOUS | Status: DC | PRN
  Administered 2014-08-30: 10 mg via INTRAVENOUS
  Filled 2014-08-30 (×2): qty 4

## 2014-08-30 MED ORDER — ARTIFICIAL TEARS OP OINT
TOPICAL_OINTMENT | OPHTHALMIC | Status: DC | PRN
  Administered 2014-08-30: 1 via OPHTHALMIC

## 2014-08-30 MED ORDER — OXYCODONE HCL 5 MG/5ML PO SOLN: 5.0000 mg | Freq: Once | ORAL | Status: DC | PRN

## 2014-08-30 MED ORDER — OXYCODONE-ACETAMINOPHEN 5-325 MG PO TABS
ORAL_TABLET | ORAL | Status: AC
  Administered 2014-08-30: 2 via ORAL
  Filled 2014-08-30: qty 2

## 2014-08-30 MED ORDER — OXYCODONE-ACETAMINOPHEN 5-325 MG PO TABS
1.0000 | ORAL_TABLET | ORAL | Status: DC | PRN
  Administered 2014-08-30 – 2014-08-31 (×3): 2 via ORAL
  Filled 2014-08-30 (×3): qty 2

## 2014-08-30 MED ORDER — SENNOSIDES-DOCUSATE SODIUM 8.6-50 MG PO TABS
1.0000 | ORAL_TABLET | Freq: Every evening | ORAL | Status: DC | PRN
Start: 2014-08-30 — End: 2014-08-31
  Filled 2014-08-30: qty 1

## 2014-08-30 MED ORDER — ATORVASTATIN CALCIUM 40 MG PO TABS
40.0000 mg | ORAL_TABLET | Freq: Every day | ORAL | Status: DC
  Administered 2014-08-30: 40 mg via ORAL
  Filled 2014-08-30 (×2): qty 1

## 2014-08-30 MED ORDER — SODIUM CHLORIDE 0.9 % IV SOLN
INTRAVENOUS | Status: DC
  Administered 2014-08-30: 16:00:00 via INTRAVENOUS

## 2014-08-30 MED ORDER — IODIXANOL 320 MG/ML IV SOLN
INTRAVENOUS | Status: DC | PRN
  Administered 2014-08-30: 137.3 mL via INTRAVENOUS

## 2014-08-30 MED ORDER — BOOST / RESOURCE BREEZE PO LIQD
1.0000 | Freq: Three times a day (TID) | ORAL | Status: DC
  Administered 2014-08-31: 1 via ORAL

## 2014-08-30 MED ORDER — HYDROMORPHONE HCL 1 MG/ML IJ SOLN
INTRAMUSCULAR | Status: AC
  Filled 2014-08-30: qty 1

## 2014-08-30 MED ORDER — LABETALOL HCL 5 MG/ML IV SOLN
INTRAVENOUS | Status: AC
  Administered 2014-08-30: 10 mg via INTRAVENOUS
  Filled 2014-08-30: qty 4

## 2014-08-30 MED ORDER — GUAIFENESIN-DM 100-10 MG/5ML PO SYRP: 15.0000 mL | ORAL_SOLUTION | ORAL | Status: DC | PRN

## 2014-08-30 MED ORDER — MIDAZOLAM HCL 5 MG/5ML IJ SOLN
INTRAMUSCULAR | Status: DC | PRN
  Administered 2014-08-30 (×2): 1 mg via INTRAVENOUS

## 2014-08-30 MED ORDER — ONDANSETRON HCL 4 MG/2ML IJ SOLN: 4.0000 mg | Freq: Four times a day (QID) | INTRAMUSCULAR | Status: DC | PRN

## 2014-08-30 MED ORDER — HYDROCHLOROTHIAZIDE 12.5 MG PO CAPS
12.5000 mg | ORAL_CAPSULE | Freq: Every day | ORAL | Status: DC
  Administered 2014-08-30 – 2014-08-31 (×2): 12.5 mg via ORAL
  Filled 2014-08-30 (×2): qty 1

## 2014-08-30 MED ORDER — MORPHINE SULFATE 2 MG/ML IJ SOLN
INTRAMUSCULAR | Status: AC
  Administered 2014-08-30: 2 mg via INTRAVENOUS
  Filled 2014-08-30: qty 1

## 2014-08-30 MED ORDER — ALUM & MAG HYDROXIDE-SIMETH 200-200-20 MG/5ML PO SUSP: 15.0000 mL | ORAL | Status: DC | PRN

## 2014-08-30 MED ORDER — SODIUM CHLORIDE 0.9 % IV SOLN: 500.0000 mL | Freq: Once | INTRAVENOUS | Status: AC | PRN

## 2014-08-30 MED ORDER — ENOXAPARIN SODIUM 30 MG/0.3ML ~~LOC~~ SOLN
30.0000 mg | SUBCUTANEOUS | Status: DC
  Administered 2014-08-31: 30 mg via SUBCUTANEOUS
  Filled 2014-08-30 (×2): qty 0.3

## 2014-08-30 MED ORDER — VECURONIUM BROMIDE 10 MG IV SOLR
INTRAVENOUS | Status: DC | PRN
  Administered 2014-08-30: 2 mg via INTRAVENOUS
  Administered 2014-08-30: 4 mg via INTRAVENOUS

## 2014-08-30 MED ORDER — PROPOFOL 10 MG/ML IV BOLUS
INTRAVENOUS | Status: DC | PRN
  Administered 2014-08-30: 200 mg via INTRAVENOUS

## 2014-08-30 MED ORDER — PROTAMINE SULFATE 10 MG/ML IV SOLN
INTRAVENOUS | Status: DC | PRN
Start: 2014-08-30 — End: 2014-08-30
  Administered 2014-08-30: 80 mg via INTRAVENOUS

## 2014-08-30 MED ORDER — ASPIRIN 81 MG PO CHEW
81.0000 mg | CHEWABLE_TABLET | Freq: Every day | ORAL | Status: DC
  Administered 2014-08-30 – 2014-08-31 (×2): 81 mg via ORAL
  Filled 2014-08-30 (×2): qty 1

## 2014-08-30 MED ORDER — HEPARIN SODIUM (PORCINE) 1000 UNIT/ML IJ SOLN
INTRAMUSCULAR | Status: DC | PRN
  Administered 2014-08-30: 8000 [IU] via INTRAVENOUS

## 2014-08-30 MED ORDER — HYDRALAZINE HCL 20 MG/ML IJ SOLN
INTRAMUSCULAR | Status: AC
  Filled 2014-08-30: qty 1

## 2014-08-30 MED ORDER — LOSARTAN POTASSIUM-HCTZ 100-12.5 MG PO TABS: 1.0000 | ORAL_TABLET | Freq: Every day | ORAL | Status: DC

## 2014-08-30 SURGICAL SUPPLY — 53 items
CANISTER SUCTION 2500CC (MISCELLANEOUS) ×2 IMPLANT
CATH BEACON 5.038 65CM KMP-01 (CATHETERS) IMPLANT
CATH OMNI FLUSH .035X70CM (CATHETERS) IMPLANT
COVER PROBE W GEL 5X96 (DRAPES) ×2 IMPLANT
DEVICE CLOSURE PERCLS PRGLD 6F (VASCULAR PRODUCTS) IMPLANT
DRSG TEGADERM 2-3/8X2-3/4 SM (GAUZE/BANDAGES/DRESSINGS) ×4 IMPLANT
DRYSEAL FLEXSHEATH 12FR 33CM (SHEATH) ×1
DRYSEAL FLEXSHEATH 16FR 33CM (SHEATH) ×1
ELECT REM PT RETURN 9FT ADLT (ELECTROSURGICAL) ×4
ELECTRODE REM PT RTRN 9FT ADLT (ELECTROSURGICAL) ×2 IMPLANT
EXCLUDER TNK 23X14.5MMX12CM (Endovascular Graft) IMPLANT
EXCLUDER TRUNK 23X14.5MMX12CM (Endovascular Graft) ×2 IMPLANT
GAUZE SPONGE 2X2 8PLY STRL LF (GAUZE/BANDAGES/DRESSINGS) IMPLANT
GLOVE BIO SURGEON STRL SZ7.5 (GLOVE) ×4 IMPLANT
GLOVE BIOGEL PI IND STRL 7.5 (GLOVE) IMPLANT
GLOVE BIOGEL PI INDICATOR 7.5 (GLOVE) ×1
GLOVE SS N UNI LF 7.5 STRL (GLOVE) ×1 IMPLANT
GLOVE SURG SS PI 7.0 STRL IVOR (GLOVE) ×4 IMPLANT
GOWN STRL REUS W/ TWL LRG LVL3 (GOWN DISPOSABLE) ×3 IMPLANT
GOWN STRL REUS W/TWL LRG LVL3 (GOWN DISPOSABLE) ×12
GRAFT BALLN CATH 65CM (STENTS) IMPLANT
KIT BASIN OR (CUSTOM PROCEDURE TRAY) ×2 IMPLANT
KIT ROOM TURNOVER OR (KITS) ×2 IMPLANT
LEG CONTRALATERAL 16X16X13.5 (Endovascular Graft) ×2 IMPLANT
LEG CONTRALATERAL 16X16X9.5 (Endovascular Graft) ×1 IMPLANT
LIQUID BAND (GAUZE/BANDAGES/DRESSINGS) ×3 IMPLANT
NDL PERC 18GX7CM (NEEDLE) ×1 IMPLANT
NEEDLE PERC 18GX7CM (NEEDLE) ×2 IMPLANT
NS IRRIG 1000ML POUR BTL (IV SOLUTION) ×2 IMPLANT
PACK ENDOVASCULAR (PACKS) ×2 IMPLANT
PAD ARMBOARD 7.5X6 YLW CONV (MISCELLANEOUS) ×4 IMPLANT
PERCLOSE PROGLIDE 6F (VASCULAR PRODUCTS) ×18
SHEATH AVANTI 11CM 8FR (MISCELLANEOUS) ×1 IMPLANT
SHEATH BRITE TIP 8FR 23CM (MISCELLANEOUS) ×1 IMPLANT
SHEATH DRYSEAL FLEX 12FR 33CM (SHEATH) IMPLANT
SHEATH DRYSEAL FLEX 16FR 33CM (SHEATH) IMPLANT
SPONGE GAUZE 2X2 STER 10/PKG (GAUZE/BANDAGES/DRESSINGS) ×2
SPONGE SURGIFOAM ABS GEL 100 (HEMOSTASIS) IMPLANT
STAPLER VISISTAT 35W (STAPLE) IMPLANT
STENT GRAFT BALLN CATH 65CM (STENTS) ×1
STENT GRAFT CONTRALAT 16X13.5 (Endovascular Graft) IMPLANT
STOPCOCK MORSE 400PSI 3WAY (MISCELLANEOUS) ×2 IMPLANT
SUT PROLENE 5 0 C 1 24 (SUTURE) IMPLANT
SUT VIC AB 2-0 CT1 27 (SUTURE)
SUT VIC AB 2-0 CT1 TAPERPNT 27 (SUTURE) IMPLANT
SUT VIC AB 3-0 SH 27 (SUTURE)
SUT VIC AB 3-0 SH 27X BRD (SUTURE) IMPLANT
SUT VICRYL 4-0 PS2 18IN ABS (SUTURE) ×4 IMPLANT
SYR 30ML LL (SYRINGE) ×2 IMPLANT
TRAY FOLEY CATH 16FRSI W/METER (SET/KITS/TRAYS/PACK) ×2 IMPLANT
TUBING HIGH PRESSURE 120CM (CONNECTOR) ×2 IMPLANT
WIRE AMPLATZ SS-J .035X180CM (WIRE) ×4 IMPLANT
WIRE BENTSON .035X145CM (WIRE) ×2 IMPLANT

## 2014-08-30 NOTE — Anesthesia Procedure Notes (Signed)
Procedure Name: Intubation Date/Time: 08/30/2014 10:00 AM Performed by: Jacquiline Doe A Pre-anesthesia Checklist: Patient identified, Emergency Drugs available, Suction available, Patient being monitored and Timeout performed Patient Re-evaluated:Patient Re-evaluated prior to inductionOxygen Delivery Method: Circle system utilized Preoxygenation: Pre-oxygenation with 100% oxygen Intubation Type: IV induction and Cricoid Pressure applied Ventilation: Mask ventilation without difficulty Laryngoscope Size: Mac and 4 Grade View: Grade I Tube type: Oral Tube size: 8.0 mm Number of attempts: 2 Airway Equipment and Method: Stylet and Bougie stylet Placement Confirmation: ETT inserted through vocal cords under direct vision,  positive ETCO2 and breath sounds checked- equal and bilateral Secured at: 24 cm Tube secured with: Tape Dental Injury: Teeth and Oropharynx as per pre-operative assessment  Comments: Pt intubated via Laryngoscopy , unable to seal , air leak , used Bougie stylet per Dr. Linna Caprice and tube easily changed . Laryngoscopy during tube change , [facility] , SaO2 100 %  Throughout induction .

## 2014-08-30 NOTE — H&P (View-Only) (Signed)
[facility] [place] HISTORY AND PHYSICAL   History of Present Illness:  Patient is a 64 y.o. year old male who presents for evaluation of enlarging abdominal aortic aneurysm.  The aneurysm was 4.7 cm August 2015 by ultrasound.  He denies abdominal or back pain.  The aneurysm was found on screening US.  He has no family history of AAA. He currently smokes 1/2 PPD of cigarettes.  He complains of bilateral leg/calf pain after walking a few blocks.  This is chronic.  It is relieved by rest.  He has lost 25 lbs in the last year which he attributes to more walking. He denies rest pain or non healing wounds on the feet.  Other medical problems include hypertension, elevated cholesterol and an irregular heartbeat currently under investigation. He denies any syncopal episodes. He does have a history of palpitations in the past and was seen by Dr. Gwenlyn Found for this in October 2015. He states that his blood pressure has been fairly well controlled over the last few years.   Past Medical History  Diagnosis Date  . Hypertension   . Hyperchloremia   . AAA (abdominal aortic aneurysm)   . Hyperlipidemia   . Vitamin D deficiency   . Irregular heartbeat     Past Surgical History  Procedure Laterality Date  . Hernia repair      Social History History  Substance Use Topics  . Smoking status: Current Some Day Smoker  . Smokeless tobacco: Former Systems developer    Quit date: 01/20/1974  . Alcohol Use: No    Family History No family history on file.  Allergies  Allergies  Allergen Reactions  . Aspirin Nausea Only    REACTION :HIGH DOSE  Upset patients   stomach-      Current Outpatient Prescriptions  Medication Sig Dispense Refill  . aspirin 81 MG tablet Take 81 mg by mouth as needed (Pt can take low dose).     Marland Kitchen losartan-hydrochlorothiazide (HYZAAR) 100-12.5 MG per tablet Take 1 tablet by mouth daily.     No current facility-administered medications for this visit.    ROS:    General:  No Fever, chills  HEENT: No recent headaches, no nasal bleeding, no visual changes, no sore throat  Neurologic: No dizziness, blackouts, seizures. No recent symptoms of stroke or mini- stroke. No recent episodes of slurred speech, or temporary blindness.  Cardiac: No recent episodes of chest pain/pressure, no shortness of breath at rest.  No shortness of breath with exertion.  Denies history of atrial fibrillation or irregular heartbeat  Vascular: No history of rest pain in feet.  No history of claudication.  No history of non-healing ulcer, No history of DVT   Pulmonary: No home oxygen, no productive cough, no hemoptysis,  No asthma or wheezing  Musculoskeletal:  [ ]  Arthritis, [ ]  Low back pain,  [ ]  Joint pain  Hematologic:No history of hypercoagulable state.  No history of easy bleeding.  No history of anemia  Gastrointestinal: No hematochezia or melena,  No gastroesophageal reflux, no trouble swallowing  Urinary: [ ]  chronic Kidney disease, [ ]  on HD - [ ]  MWF or [ ]  TTHS, [ ]  Burning with urination, [ ]  Frequent urination, [ ]  Difficulty urinating;   Skin: No rashes  Psychological: No history of anxiety,  No history of depression   Physical Examination  Filed Vitals:   08/11/14 1136  BP: 140/98  Pulse: 91  Resp: 16  Height: 6\' 3"  (1.905 m)  Weight: 166 lb (75.297 kg)    Body mass index is 20.75 kg/(m^2).  General:  Alert and oriented, no acute distress HEENT: Normal Neck: No bruit or JVD Pulmonary: Clear to auscultation bilaterally Cardiac: Regular Rate and Rhythm without murmur Abdomen: Soft, non-tender, non-distended, easily palpable pulsatile epigastric mass, no scars Skin: No rash Extremity Pulses:  2+ radial, brachial, femoral, absent dorsalis pedis, posterior tibial pulses bilaterally Musculoskeletal: No deformity or edema  Neurologic: Upper and lower extremity motor 5/5 and symmetric  DATA:  CT scans of the abdomen and pelvis is reviewed  today. Aneurysm is 5.5 centimeters in diameter. There is at least 2 cm of infrarenal neck. Renal artery is the lowest. No iliac component. There is some moderate iliac tortuosity. Bilateral adrenal masses.  MRA of the abdomen 6 cm lobulated left adrenal mass, 2 cm right adrenal mass   ASSESSMENT:  #1 abdominal aortic aneurysm 5.5 cm diameter should be a good candidate for Gore Excluder stent graft repair percutaneously we'll schedule in the near future if pheochromocytoma workup negative.  Aneurysm repair tentatively scheduled for early April pending endocrine workup                              #2 referral to Ezzie Dural with [facility] endocrinology for workup of his adrenal masses, will need to rule out pheochromocytoma prior to considering aneurysm repair                             #3 appointment with Dr. Quay Burow for cardiology risk stratification prior to aneurysm repair.  He may also need to wait on metanephrines prior to pharmacologic cardiac stress test                            #4 referral to Dr. Armandina Gemma for consideration of removal of left adrenal gland after workup is complete by endocrinology.  I spoke with him today and most likely the patient will need removal of the left adrenal gland but he would prefer to defer this time after his aneurysm repair is complete.   PLAN:  See above  Ruta Hinds, MD [facility] [place] Office: [phone] Pager: [phone]

## 2014-08-30 NOTE — Transfer of Care (Signed)
Immediate Anesthesia Transfer of Care Note  Patient: Eric Barnett  Procedure(s) Performed: Procedure(s): ABDOMINAL AORTIC ENDOVASCULAR STENT GRAFT (N/A)  Patient Location: PACU  Anesthesia Type:General  Level of Consciousness: awake, oriented, sedated, patient cooperative and responds to stimulation  Airway & Oxygen Therapy: Patient Spontanous Breathing and Patient connected to nasal cannula oxygen  Post-op Assessment: Report given to RN, Post -op Vital signs reviewed and stable, Patient moving all extremities and Patient moving all extremities X 4  Post vital signs: Reviewed and stable  Last Vitals:  Filed Vitals:   08/30/14 1223  BP:   Pulse:   Temp: 36.1 C  Resp:     Complications: No apparent anesthesia complications

## 2014-08-30 NOTE — Progress Notes (Signed)
  Day of Surgery Note    Subjective:  Sleepy-awakes to voice.  No complaints  Filed Vitals:   08/30/14 1445  BP: 135/83  Pulse: 70  Temp:   Resp: 13    Incisions:   Bilateral groins are soft without hematoma Extremities:  + monophasic doppler signals DP/PT bilaterally Cardiac:  regular Lungs:  Non labored  Abdomen:  Soft, NT/ND  Assessment/Plan:  This is a 64 y.o. male who is s/p EVAR  -pt doing well in recovery -transfer to [facility] when bed available    Leontine Locket, PA-C 08/30/2014 3:00 PM

## 2014-08-30 NOTE — Op Note (Signed)
OPERATIVE NOTE   PROCEDURE: 1. Bilateral common femoral artery cannulation under ultrasound guidance 2. "Preclose" repair of bilateral common femoral artery  3. Placement of catheter in aorta x 2 4. Aortogram 5. Placement of bifurcated aortic endograft (23 mm x 14 mm x 12 cm) 6. Placement of right iliac limb (16 mm x 9.5 cm) 7. Placement of left iliac limb extension (16 mm x 13.5 cm)  PRE-OPERATIVE DIAGNOSIS: asymptomatic abdominal aortic aneurysm   POST-OPERATIVE DIAGNOSIS: same as above   CO-SURGEONS: Ruta Hinds, MD; Adele Barthel, MD  ANESTHESIA: general  ESTIMATED BLOOD LOSS: 200 cc  FINDING(S): 1. Successful exclusion of abdominal aortic aneurysm with preservation of bilateral renal arteries and internal iliac arteries 2. No endoleak 3. Dopplerable pedal signals bilaterally at end of case 4. Heavily calcified bilateral common femoral arteries  SPECIMEN(S): none  INDICATIONS:  Eric Barnett is a 64 y.o. male  who presents with asymptomatic abdominal aortic aneurysm. Dr. Oneida Alar discussed with the patient proceeding in endovascular aortic repair given his anatomy was compatible and he had reached the size cut off of 5.5 cm.  The patient is aware the risks of endovascular aortic surgery include but are not limited to: bleeding, need for transfusion, infection, death, stroke, paralysis, wound complications, bowel ischemia, extended ventilation, anaphylactic reaction to contrast, contrast induced nephropathy, embolism, and need for additional procedure to address endoleaks. Overall, I cited a mortality rate of 1-2% and morbidity rate of 15%.  DESCRIPTION: After obtaining full informed written consent, the patient was brought back to the operating room and placed supine upon the operating table. The patient received IV antibiotics prior to induction. After obtaining adequate anesthesia, the patient was prepped and draped in the standard fashion for: aortic and  endovascular aortic repair. At this point, we turned our attention to the left groin. Under Sonosite guidance, I cannulated the left common femoral artery. A Benson wire was passed into the aorta. The needle was exchanged for a Proglide device, which was deployed with medial rotation.  This proglide device failed and the suture were pulled out with the device.  I replaced the [facility] wire in the device before removing it.  I placed another Proglide device, which was deployed with medial rotation.  The proglide sutures were removed and tagged. The [facility] wire was reloaded through the used Proglide device. The device was exchanged for a new one. The left Proglide was deployed with lateral rotation. Unfortunately, this device failed another 4 times, each time breaking the suture on calcified plaque.  Finally, Dr. Oneida Alar placed another Proglide and rotated it nearly 60 degrees.  This time the suture deployed without breaking.  The proglide sutures were removed and tagged. The [facility] wire was reloaded through the used Proglide device. A long 8-Fr sheath was loaded in the right common femoral artery.  At this point, Dr. Oneida Alar repeated the "preclose" technique as described above on the right common femoral artery, without the same fracturing of the suture.  He placed a short 8-Fr sheath on this side.  The patient was given a therapeutic bolus of Heparin for anticoagulation during the procedure.  I then loaded a pigtail catheter over the left wire.  The wire was exchanged for an Amplatz wire which was advanced into the suprarenal aorta.  The catheter was removed.  The left sheath was exchanged for a 12-Fr Dryseal sheath.  I loaded the pigtail back over the wire and removed the wire.  I placed the pigtail catheter in the suprarenal  position for an aortogram.  Dr. Oneida Alar then exchanged the right wire with a KMP catheter.  The wire was placed in the suprarenal aorta.  The catheter was removed and then the right  sheath exchanged for a 16-Fr Dryseal sheath.  He loaded the main body through this sheath: C3 23 mm x 14 mm x 12 cm in the infrarenal position.  A power injector aortogram was completed to identify the location of the left renal artery which was the lowest renal artery.  The main body was deployed by Dr. Oneida Alar.  Completion aortogram demonstrated patency over both renal arteries with perfect deployment at the immediately infrarenal level/  I put an Amplatz wire into the pigtail catheter and removed the catheter. I placed a buddy wire, Britta Mccreedy, through the left femoral sheath with a 8-Fr dilator. Using a KMP and the wire, I was able to cannulate the contralateral gate. I advanced the catheter and wire into the suprarenal aorta. The wire was exchanged for an Amplatz wire. The catheter was exchanged for a pigtail catheter. The wire was pulled down and the catheter was rotated at the top of the endograft to demonstrated intragraft position of the catheter. The Amplatz wire was replaced in this catheter and the catheter pulled down the flow divider.   A retrograde injection from the right femoral sheath demonstrated the position of the left internal iliac artery. Based on the length measurements, a left bell-bottom iliac limb (16 mm x 13.5 cm) was selected. I deployed this with adequate overlap and the stent delivery device was removed.   At this point, Dr. Oneida Alar full deployed the ipsilateral limb and released the main body. The stent delivery device was removed. A retrograde injection was completed via right femoral sheath. Based on the measurements, a 16 mm x 9.5 cm iliac extension limb was selected. This graft was deployed without any coverage of the right internal iliac artery.  At this point, I passed the Q45 balloon up the left sheath and inflated the molding balloon at the proximal aortic endograft, , at the flow divider and at the end of the left iliac limb. The balloon was deflated and  placed on the right wire. Dr. Oneida Alar molded the overlapping points of the limbs and the distal iliac limb.  A pigtail catheter was loaded on the right side and a completion aortogram was completed. Based on the images, there appeared to be no endoleak, widely patent renal arteries and continued bilateral internal iliac artery patency. The pigtail catheter was removed over a Benson wire on the right side and the left wire was exchanged for a [employer] wire. We turned our attention to the left groin. Using the knot pusher, the Proglide sutures were sequentially tightened with the wire in place. There was minimal bleeding at this point, so I removed the wire.I then completed a second round of sequential suture tightening and applied tension to the left Proglide sutures.  In a similar fashion, the right sheath was removed and the Proglide sutures were sequentially tightened to gain hemostasis.  The wire was removed and the sutures were tightened a final time.  Pressure was applied to both sets of sutures.    The patient was given 80 mg of Protamine and then after waiting a few minutes, no further bleeding was occurring from either groin.  The sutures were transected with the knot cutter and then the stab incisions in both groins repaired with 4-0 Vicryl U-stitches.  The skin was cleaned, dried, and  reinforced with Dermabond.  Distally there were dopplerable pedal signals.     COMPLICATIONS: none  CONDITION: stable   Adele Barthel, MD [facility] [place] Office: [phone] Pager: [phone]  08/30/2014, 12:46 PM

## 2014-08-30 NOTE — Anesthesia Preprocedure Evaluation (Addendum)
Anesthesia Evaluation  Patient identified by MRN, date of birth, ID band Patient awake    Reviewed: Allergy & Precautions, NPO status , Patient's Chart, lab work & pertinent test results  Airway Mallampati: II  TM Distance: >3 FB Neck ROM: Full    Dental  (+) Poor Dentition, Dental Advisory Given   Pulmonary Current Smoker,  breath sounds clear to auscultation        Cardiovascular hypertension, Rhythm:Regular Rate:Normal     Neuro/Psych    GI/Hepatic   Endo/Other    Renal/GU      Musculoskeletal   Abdominal   Peds  Hematology   Anesthesia Other Findings   Reproductive/Obstetrics                            Anesthesia Physical Anesthesia Plan  ASA: III  Anesthesia Plan: General   Post-op Pain Management:    Induction: Intravenous  Airway Management Planned: Oral ETT  Additional Equipment: Arterial line  Intra-op Plan:   Post-operative Plan: Extubation in OR  Informed Consent: I have reviewed the patients History and Physical, chart, labs and discussed the procedure including the risks, benefits and alternatives for the proposed anesthesia with the patient or authorized representative who has indicated his/her understanding and acceptance.   Dental advisory given  Plan Discussed with: Anesthesiologist and CRNA  Anesthesia Plan Comments:         Anesthesia Quick Evaluation

## 2014-08-30 NOTE — Op Note (Signed)
Procedure: Gore Excluder repair of Abdominal Aortic Aneurysm  Preop: AAA Postop: AAA  Co Surgeon: Adele Barthel MD  Operative findings:   #1 Bilateral Proglide closure   #2 28x14x12 cm main body Gore Excluder device delivered via a right femoral system   #3 16 x 11.5 cm left iliac limb contralateral    #4 23 x 10 ipsilateral iliac extension right  PROCEDURE DETAIL: After obtaining informed consent the patient was taken to the operating room. The patient was placed in supine position the operating room table. After induction of general anesthesia and endotracheal intubation a Foley catheter was placed. Next the patient was prepped and draped in usual sterile fashion from the nipples down to the knees. Ultrasound was used to identify the right common femoral artery as well as the femoral bifurcation. An 11 blade was used to make a small neck in the skin over the level of the right common femoral artery. An introducer needle was then used to cannulate the right common femoral artery without difficulty. A 0.035 Bentson wire was then threaded up into the abdominal aorta through the right femoral system. A short 9 French dilator was placed over the guidewire the right femoral system. This was used to dilate the tract. The dilator was then removed and a Proglide device inserted over the guidewire into the right femoral system and this was deployed at the 2:00 position. The Proglide device was then removed and an additional Proglide device was brought in operative field and deployed at the 10:00 position. The sutures were kept separate and tagged with suture tags. Next the short 9 French sheath was then placed back over the guidewire into the right femoral system and the dilator removed and the sheath thoroughly flushed with heparinized saline. Attention was then turned to the left groin. Again using ultrasound the left common femoral artery was identified. The femoral bifurcation was also identified. A small  nick was made in the skin with the 11 blade. A hemostat was used to dilate a tract down to the artery. An introducer needle was then used to cannulate the left common femoral artery without difficulty. A 0.035 Bentson wire was then threaded up into the abdominal aorta under fluoroscopic guidance. A 9 French dilator was then placed over the wire to dilate the tract. Two Proglide devices were again brought on operative field and these were fired sequentially in the 10:00 position followed by an additional Proglide at the 2:00 position. Several of the Proglide devices initially broke on the lateral side either due to vessel tortuosity or calcium.  This improved by moving the device over to the 3 o'clock position.   The Proglide delivery systems were removed and the long 9 French sheath placed back over the guidewire up to the level of the iliac of the aortic bifurcation.  There was still some ooze around the sheath so an 035 Amplatz was placed back through the omni flush and a sheath exchange performed to a 12 Fr dry seal sheath.  This achieved hemostasis.  The patient was then given 8000 units of intravenous heparin.   At this point, a 5 [place] Omni flush catheter was placed over the guidewire and the left groin up into the abdominal aorta. An abdominal aortogram was obtained in the AP position with 20 degrees of cranial caudal to determine level of the left and right renal arteries. At this point a 23 x 14 x 12 Gore Excluder main body device was selected. The Bentson wire from  the right groin was advanced up into the descending thoracic aorta over a Kumpe catheter and the Bentsen wire replaced with an 035 Amplatz wire.  The short sheath was replaced with a 16 Fr dry seal sheath over the wire. The main body device was then placed over the Amplatz wire in the right groin and advanced up to the level of the renal arteries. There was some intitial resistance at the skin level and the skin nick was made larger.   Magnified views of the renal arteries were performed to make sure that we were not covering these. The top portion of the stent graft was then deployed with the end of the stent just below the level of the left renal artery and this came over to just below the level of the right renal artery. The main body was delivered all the way down to the contralateral gate. Attention was then turned to the left groin. The Omni flush catheter was exchanged over an amplatz guidewire and removed.  An additional Bentson wire placed in position as a buddy wire to cannulate the contralateral gate. The Kumpe catheter was placed back over the guidewire in the left femoral system. A 5 [place] Kumpe catheter was placed over this and this was used to selectively catheterize the contralateral gate and the guidewire was then advanced into the descending thoracic aorta. The main body portion of the gate was confirmed by twirling the pigtail catheter. The pigtail  catheter was then placed in a location so that we could use its markers to determine the exact length to the iliac bifurcation. An Amplatz wire was placed back through the pigtail catheter. A retrograde contrast angiogram was performed to determine the level of the left internal iliac artery and a 16 x 13.5 cm iliac limb was selected. The pigtail catheter was removed over the guidewire and the 16 x 13.5 cm limb advanced so there was full coverage of the long marker on the contralateral limb. This was then deployed in the usual fashion down to the iliac bifurcation. The delivery system was then removed. The remainder of the ipsilateral iliac limb was also deployed.  A retrograde contrast angiogram was also performed to make sure that the right iliac limb did not cover the right internal iliac artery.  Measurement was made of the right iliac bifurcation and a 16 x9.5 cm ipsilateral limb was placed.  Attention was then turned back to the left iliac system and a Gore aortic balloon  placed over the wire up to the level of the proximal end of the stent and this was ballooned to profile. The limb attachment site was also ballooned as well as the distal attachment site. Attention was then turned to the right groin and the balloon was advanced over the guidewire on the right side and the distal attachment site as well as the midportion of iliac limb were also ballooned. The 5 [place] Omni flush catheter was then placed back to the guidewire on the right side and a completion arteriogram was obtained. This showed no evidence of proximal or distal type I endoleak. There was no type II endoleak. There is no filling of the aneurysm sac.  There was patency of the internal external iliac and renal arteries bilaterally with no endoleak.    At this point the Omni flush catheter was removed over a guidewire. All delivery devices were removed. We then proceeded to remove the large 18 French sheath from the right side with the guidewire in  place. With pressure held above and below the insertion site the lateral and medial Proglide closure was secured down.  There was good hemostasis. The guidewire was removed from the right side. Attention was then turned to the left groin. In similar fashion the 12 French sheath was removed and the guidewire left in place. The lateral and medial Proglide were secured and there was good hemostasis and the Bentsen wire was removed from the left groin. The patient had been given 8000 units of heparin before introducing the main body device. This was fully reversed with 80 mg of protamine at the end of the case. Each groin puncture site was then closed with a running 4-0 Vicryl subcuticular stitch.  The patient tolerated procedure well and there were no complications. Instrument sponge and needle counts were correct at the end of the case. Patient was awakened in the operating room extubated and taken to the recovery room in stable condition.  The patient had doppler signals  in both feet at the end of the case.  Eric Hinds, MD [facility] [place] Office: [phone] Pager: [phone]

## 2014-08-30 NOTE — Interval H&P Note (Signed)
History and Physical Interval Note:  08/30/2014 9:02 AM  Eric Barnett  has presented today for surgery, with the diagnosis of Abdominal aortic aneurysm I71.4  Metanephrine study was negative.  Cardiac risk low. The various methods of treatment have been discussed with the patient and family. After consideration of risks, benefits and other options for treatment, the patient has consented to  Procedure(s): ABDOMINAL AORTIC ENDOVASCULAR STENT GRAFT (N/A) as a surgical intervention .  The patient's history has been reviewed, patient examined, no change in status, stable for surgery.  I have reviewed the patient's chart and labs.  Questions were answered to the patient's satisfaction.     Eric Barnett E

## 2014-08-31 ENCOUNTER — Other Ambulatory Visit: Payer: Self-pay | Admitting: *Deleted

## 2014-08-31 ENCOUNTER — Encounter (HOSPITAL_COMMUNITY): Payer: Self-pay | Admitting: Vascular Surgery

## 2014-08-31 DIAGNOSIS — Z48812 Encounter for surgical aftercare following surgery on the circulatory system: Secondary | ICD-10-CM

## 2014-08-31 DIAGNOSIS — I714 Abdominal aortic aneurysm, without rupture, unspecified: Secondary | ICD-10-CM

## 2014-08-31 LAB — CBC
HEMATOCRIT: 37.4 % — AB (ref 39.0–52.0)
HEMOGLOBIN: 12.8 g/dL — AB (ref 13.0–17.0)
MCH: 31.8 pg (ref 26.0–34.0)
MCHC: 34.2 g/dL (ref 30.0–36.0)
MCV: 93 fL (ref 78.0–100.0)
Platelets: 163 10*3/uL (ref 150–400)
RBC: 4.02 MIL/uL — ABNORMAL LOW (ref 4.22–5.81)
RDW: 12.7 % (ref 11.5–15.5)
WBC: 7.1 10*3/uL (ref 4.0–10.5)

## 2014-08-31 LAB — BASIC METABOLIC PANEL
Anion gap: 6 (ref 5–15)
BUN: 8 mg/dL (ref 6–23)
CALCIUM: 8.9 mg/dL (ref 8.4–10.5)
CO2: 28 mmol/L (ref 19–32)
CREATININE: 0.79 mg/dL (ref 0.50–1.35)
Chloride: 104 mmol/L (ref 96–112)
GFR calc Af Amer: >90 mL/min (ref >90)
GLUCOSE: 112 mg/dL — AB (ref 70–99)
Potassium: 3.9 mmol/L (ref 3.5–5.1)
SODIUM: 138 mmol/L (ref 135–145)

## 2014-08-31 MED ORDER — OXYCODONE-ACETAMINOPHEN 5-325 MG PO TABS: 1.0000 | ORAL_TABLET | ORAL | Status: DC | PRN

## 2014-08-31 NOTE — Progress Notes (Signed)
Pt discharged home with family. Pt given AVS and prescription. Pt verbalized understanding of discharge teaching. Pt belongings with patient and family. PIV removed. Elink and [place] notified.

## 2014-08-31 NOTE — Discharge Summary (Signed)
[facility] Discharge Summary   Patient ID:  Eric Barnett MRN: [id number] DOB/AGE: 07/04/1950 64 y.o.  Admit date: 08/30/2014 Discharge date: 08/31/2014 Date of Surgery: 08/30/2014 Surgeon: Surgeon(s): Elam Dutch, MD Conrad Laureldale, MD  Admission Diagnosis: Abdominal aortic aneurysm I71.4  Discharge Diagnoses:  Abdominal aortic aneurysm I71.4  Secondary Diagnoses: Past Medical History  Diagnosis Date  . Hypertension   . Hyperchloremia   . AAA (abdominal aortic aneurysm)   . Hyperlipidemia   . Vitamin D deficiency   . Irregular heartbeat     Procedure(s): ABDOMINAL AORTIC ENDOVASCULAR STENT GRAFT  Discharged Condition: good  HPI: Patient is a 64 y.o. year old male who presents for evaluation of enlarging abdominal aortic aneurysm. The aneurysm was 4.7 cm August 2015 by ultrasound. He denies abdominal or back pain. The aneurysm was found on screening US. He has no family history of AAA. He currently smokes 1/2 PPD of cigarettes. He complains of bilateral leg/calf pain after walking a few blocks. This is chronic. It is relieved by rest. He has lost 25 lbs in the last year which he attributes to more walking. He denies rest pain or non healing wounds on the feet. Other medical problems include hypertension, elevated cholesterol and an irregular heartbeat currently under investigation. He denies any syncopal episodes. He does have a history of palpitations in the past and was seen by Dr. Gwenlyn Found for this in October 2015. He states that his blood pressure has been fairly well controlled over the last few years.   Hospital Course:  Eric Barnett is a 64 y.o. male is S/P  Procedure(s): ABDOMINAL AORTIC ENDOVASCULAR STENT GRAFT His stay over night was uneventful.  He is ambulating, voiding and taking PO's well. Discharged in stable condition.  Doppler DP/PT bil signals.  Significant Diagnostic Studies: CBC Lab Results  Component Value Date   WBC 7.1  08/31/2014   HGB 12.8* 08/31/2014   HCT 37.4* 08/31/2014   MCV 93.0 08/31/2014   PLT 163 08/31/2014    BMET    Component Value Date/Time   NA 138 08/31/2014 0420   NA 140 08/18/2014 1145   K 3.9 08/31/2014 0420   CL 104 08/31/2014 0420   CO2 28 08/31/2014 0420   GLUCOSE 112* 08/31/2014 0420   GLUCOSE 106* 08/18/2014 1145   BUN 8 08/31/2014 0420   BUN 16 08/18/2014 1145   CREATININE 0.79 08/31/2014 0420   CREATININE 1.50* 08/03/2014 1024   CALCIUM 8.9 08/31/2014 0420   GFRNONAA >90 08/31/2014 0420   GFRAA >90 08/31/2014 0420   COAG Lab Results  Component Value Date   INR 1.07 08/19/2014     Disposition:  Discharge to :Home Discharge Instructions    Call MD for:  redness, tenderness, or signs of infection (pain, swelling, bleeding, redness, odor or green/yellow discharge around incision site)    Complete by:  As directed      Call MD for:  severe or increased pain, loss or decreased feeling  in affected limb(s)    Complete by:  As directed      Call MD for:  temperature >100.5    Complete by:  As directed      Discharge instructions    Complete by:  As directed   You may remove the dressings and take a shower in 24 hours.     Driving Restrictions    Complete by:  As directed   No driving for 1 week ro when you no longer need  pain medication.     Lifting restrictions    Complete by:  As directed   No heavy lifting for 6 weeks     Resume previous diet    Complete by:  As directed             Medication List    TAKE these medications        aspirin 81 MG tablet  Take 81 mg by mouth as needed (Pt can take low dose).     atorvastatin 40 MG tablet  Commonly known as:  LIPITOR  Take 1 tablet (40 mg total) by mouth daily.     dexamethasone 1 MG tablet  Commonly known as:  DECADRON  Take 1 tablet by mouth once at 11 pm, before coming for labs at 8 am the next morning     losartan-hydrochlorothiazide 100-12.5 MG per tablet  Commonly known as:  HYZAAR  Take  1 tablet by mouth daily.     oxyCODONE-acetaminophen 5-325 MG per tablet  Commonly known as:  PERCOCET/ROXICET  Take 1-2 tablets by mouth every 4 (four) hours as needed for moderate pain.       Verbal and written Discharge instructions given to the patient. Wound care per Discharge AVS     Follow-up Information    Follow up with Elam Dutch, MD In 4 weeks.   Specialty:  Vascular Surgery   Why:  sent message to office   Contact information:   [place] [address] [phone]       Signed: Laurence Slate [facility] 08/31/2014, 12:49 PM  - For VQI Registry use --- Instructions: Press F2 to tab through selections.  Delete question if not applicable.   Post-op:  Time to Extubation: [x ] In OR, [ ]  < 12 hrs, [ ]  12-24 hrs, [ ]  >=24 hrs Vasopressors Req. Post-op: No MI: [ ]  No, [ ]  Troponin only, [ ]  EKG or Clinical New Arrhythmia: No CHF: No ICU Stay: 0 days Transfusion: No  If yes, 0 units given  Complications: Resp failure: [x ] none, [ ]  Pneumonia, [ ]  Ventilator Chg in renal function: [x ] none, [ ]  Inc. Cr > 0.5, [ ]  Temp. Dialysis, [ ]  Permanent dialysis Leg ischemia: [x ] No, [ ]  Yes, no Surgery needed, [ ]  Yes, Surgery needed, [ ]  Amputation Bowel ischemia: [x ] No, [ ]  Medical Rx, [ ]  Surgical Rx Wound complication: [x ] No, [ ]  Superficial separation/infection, [ ]  Return to OR Return to OR: No  Return to OR for bleeding: No Stroke: [x ] None, [ ]  Minor, [ ]  Major  Discharge medications: Statin use:  Yes ASA use:  Yes Plavix use:  No  for medical reason   Beta blocker use:  No  for medical reason

## 2014-08-31 NOTE — Progress Notes (Signed)
[facility] [place]  Subjective  - Doing well ambulating, tolerating clears, and voided.   Objective 128/77 81 97.9 F (36.6 C) (Oral) 20 98%  Intake/Output Summary (Last 24 hours) at 08/31/14 0726 Last data filed at 08/31/14 0600  Gross per 24 hour  Intake 3766.67 ml  Output   4600 ml  Net -833.33 ml    Doppler DP/PT active range of motion bilateral LE Groins soft without hematoma Heart RRR Lungs non labored breathing  Assessment/Planning: POD #1 EVAR  Disposition stable d/c home F/U in 4 weeks with CTA abd/pelvis  Laurence Slate [facility] 08/31/2014 7:26 AM --  Laboratory Lab Results:  Recent Labs  08/30/14 1320 08/31/14 0420  WBC 5.5 7.1  HGB 13.0 12.8*  HCT 37.9* 37.4*  PLT 168 163   BMET  Recent Labs  08/30/14 1320 08/31/14 0420  NA 139 138  K 3.6 3.9  CL 105 104  CO2 26 28  GLUCOSE 113* 112*  BUN 11 8  CREATININE 0.91 0.79  CALCIUM 8.8 8.9    COAG Lab Results  Component Value Date   INR 1.07 08/19/2014   No results found for: PTT

## 2014-09-02 ENCOUNTER — Ambulatory Visit: Payer: No Typology Code available for payment source | Admitting: Internal Medicine

## 2014-09-02 ENCOUNTER — Telehealth: Payer: Self-pay

## 2014-09-02 ENCOUNTER — Telehealth: Payer: Self-pay | Admitting: Vascular Surgery

## 2014-09-02 DIAGNOSIS — G8918 Other acute postprocedural pain: Secondary | ICD-10-CM

## 2014-09-02 MED ORDER — TRAMADOL HCL 50 MG PO TABS: 50.0000 mg | ORAL_TABLET | Freq: Four times a day (QID) | ORAL | Status: DC | PRN

## 2014-09-02 NOTE — Telephone Encounter (Signed)
-----   Message from Mena Goes, RN sent at 08/31/2014 11:22 AM EDT ----- Regarding: Schedule   ----- Message -----    From: Ulyses Amor, PA-C    Sent: 08/31/2014   7:25 AM      To: Vvs Charge Pool  F/u in 4 weeks s/p EVAR needs CTA abd/pelvis [place]

## 2014-09-02 NOTE — Telephone Encounter (Signed)
Phone call from pt.  Complains that the pain medication is causing him to itch all over; pt. was prescribed Percocet upon discharge.  Denies visible rash.  Stated he has taken approx. 3 pills, and ended up throwing it away.  Stated his groin areas are painful.  Denies any drainage.  Stated he hasn't taken the groin dressings off.  Advised to remove the dressings carefully, and shower, using dial soap.  Denies fever/ chills.  Discussed with Dr. Oneida Alar.  Recommended to change pt. to Tramadol for pain.  Pt. notified of the new prescription being called to the pharmacy.  Verb. Understanding.

## 2014-09-02 NOTE — Telephone Encounter (Signed)
Spoke with patient to schedule. Mailed letter. Sent to [place] for prior auth. dpm

## 2014-09-03 ENCOUNTER — Encounter: Payer: Self-pay | Admitting: *Deleted

## 2014-09-06 NOTE — Anesthesia Postprocedure Evaluation (Signed)
  Anesthesia Post-op Note  Patient: Eric Barnett  Procedure(s) Performed: Procedure(s): ABDOMINAL AORTIC ENDOVASCULAR STENT GRAFT (N/A)  Patient Location: PACU  Anesthesia Type:General  Level of Consciousness: awake, alert  and oriented  Airway and Oxygen Therapy: Patient Spontanous Breathing and Patient connected to nasal cannula oxygen  Post-op Pain: mild  Post-op Assessment: Post-op Vital signs reviewed, Patient's Cardiovascular Status Stable, Respiratory Function Stable, Patent Airway, No signs of Nausea or vomiting and Pain level controlled  Post-op Vital Signs: stable  Last Vitals:  Filed Vitals:   08/31/14 0740  BP: 121/62  Pulse: 74  Temp: 36.9 C  Resp: 16    Complications: No apparent anesthesia complications

## 2014-09-23 ENCOUNTER — Telehealth: Payer: Self-pay | Admitting: *Deleted

## 2014-09-23 NOTE — Telephone Encounter (Signed)
Mr. Steeves called in to triage yesterday after he had a burning pain in his groin. He had lifted at heavy object and immediately felt a pain. I called him back this morning and he is no longer having any pain; he is afebrile. He has no lumps or swelling in the groin and has no abdominal or back pain. He is having BM and urinary output without any difficulty. I cautioned him not to lift anything over 2 lbs until he is seen for 1st postop (S/P EVAR on 08-30-14 ) visit next week with Dr. Oneida Alar. He will call us back if he has any other issues prior to CTA on Monday. He voiced understanding and agreement of this plan. Dr. Oneida Alar was made aware of this, if anything should come up over the weekend since he is on call.

## 2014-09-27 ENCOUNTER — Inpatient Hospital Stay: Admission: RE | Admit: 2014-09-27 | Payer: No Typology Code available for payment source | Source: Ambulatory Visit

## 2014-09-29 ENCOUNTER — Ambulatory Visit
Admission: RE | Admit: 2014-09-29 | Discharge: 2014-09-29 | Disposition: A | Discharge status: Home / Self Care | Payer: No Typology Code available for payment source | Source: Ambulatory Visit | Attending: Vascular Surgery | Admitting: Vascular Surgery

## 2014-09-29 ENCOUNTER — Encounter: Payer: Self-pay | Admitting: Vascular Surgery

## 2014-09-29 DIAGNOSIS — I714 Abdominal aortic aneurysm, without rupture, unspecified: Secondary | ICD-10-CM

## 2014-09-29 DIAGNOSIS — Z48812 Encounter for surgical aftercare following surgery on the circulatory system: Secondary | ICD-10-CM

## 2014-09-29 MED ORDER — IOPAMIDOL (ISOVUE-370) INJECTION 76%
75.0000 mL | Freq: Once | INTRAVENOUS | Status: AC | PRN
  Administered 2014-09-29: 75 mL via INTRAVENOUS

## 2014-09-30 ENCOUNTER — Ambulatory Visit (INDEPENDENT_AMBULATORY_CARE_PROVIDER_SITE_OTHER): Payer: Self-pay | Admitting: Vascular Surgery

## 2014-09-30 ENCOUNTER — Encounter: Payer: Self-pay | Admitting: Vascular Surgery

## 2014-09-30 VITALS — BP 133/74 | HR 85 | Ht 75.0 in | Wt 169.4 lb

## 2014-09-30 DIAGNOSIS — E279 Disorder of adrenal gland, unspecified: Secondary | ICD-10-CM

## 2014-09-30 DIAGNOSIS — I714 Abdominal aortic aneurysm, without rupture, unspecified: Secondary | ICD-10-CM

## 2014-09-30 DIAGNOSIS — E278 Other specified disorders of adrenal gland: Secondary | ICD-10-CM | POA: Insufficient documentation

## 2014-09-30 NOTE — Addendum Note (Signed)
Addended by: Mena Goes on: 09/30/2014 01:11 PM   Modules accepted: Orders

## 2014-09-30 NOTE — Progress Notes (Signed)
[facility] [place] HISTORY AND PHYSICAL    History of Present Illness:  Patient is a 64 y.o. male who presents for follow-up evaluation of AAA. He underwent Gore Excluder aneurysm stent graft repair on 08/30/2014. The patient denies new abdominal or back pain.  The patient's atherosclerotic risk factors remain hypertension and hyperlipidemia..  These are all currently stable and followed by his primary care physician. He feels good and wishes to return to exercising.  Past Medical History  Diagnosis Date  . Hypertension   . Hyperchloremia   . AAA (abdominal aortic aneurysm)   . Hyperlipidemia   . Vitamin D deficiency   . Irregular heartbeat      Past Surgical History  Procedure Laterality Date  . Hernia repair    . Tonsillectomy    . Abdominal aortic endovascular stent graft N/A 08/30/2014    Procedure: ABDOMINAL AORTIC ENDOVASCULAR STENT GRAFT;  Surgeon: Elam Dutch, MD;  Location: [facility] OR;  Service: Vascular;  Laterality: N/A;       Review of Systems:  Neurologic: denies symptoms of TIA, amaurosis, or stroke Cardiac:denies shortness of breath or chest pain Pulmonary: denies cough or wheeze Abdomen: denies abdominal pain nausea or vomiting  History   Social History  . Marital Status: Single    Spouse Name: N/A  . Number of Children: N/A  . Years of Education: N/A   Occupational History  . Not on file.   Social History Main Topics  . Smoking status: Current Some Day Smoker -- 17 years    Types: Cigarettes  . Smokeless tobacco: Former Systems developer    Quit date: 01/20/1974  . Alcohol Use: No  . Drug Use: No  . Sexual Activity: Not on file   Other Topics Concern  . Not on file   Social History Narrative    Allergies  Allergen Reactions  . Aspirin Nausea Only    REACTION :HIGH DOSE  Upset patients   stomach-     Current Outpatient Prescriptions on File Prior to Visit  Medication Sig Dispense Refill  . aspirin 81 MG tablet Take 81 mg by  mouth as needed (Pt can take low dose).     Marland Kitchen atorvastatin (LIPITOR) 40 MG tablet Take 1 tablet (40 mg total) by mouth daily. 30 tablet 6  . dexamethasone (DECADRON) 1 MG tablet Take 1 tablet by mouth once at 11 pm, before coming for labs at 8 am the next morning 1 tablet 0  . losartan-hydrochlorothiazide (HYZAAR) 100-12.5 MG per tablet Take 1 tablet by mouth daily.    . traMADol (ULTRAM) 50 MG tablet Take 1 tablet (50 mg total) by mouth every 6 (six) hours as needed. 20 tablet 0  . oxyCODONE-acetaminophen (PERCOCET/ROXICET) 5-325 MG per tablet Take 1-2 tablets by mouth every 4 (four) hours as needed for moderate pain. (Patient not taking: Reported on 09/30/2014) 30 tablet 0   No current facility-administered medications on file prior to visit.       Physical Examination    Filed Vitals:   09/30/14 0922  BP: 133/74  Pulse: 85  Height: 6\' 3"  (1.905 m)  Weight: 169 lb 6.4 oz (76.839 kg)  SpO2: 100%     General:  Alert and oriented, no acute distress HEENT: Normal Pulmonary: Clear to auscultation bilaterally Cardiac: Regular Rate and Rhythm without murmur Abdomen: Soft, non-tender, non-distended, normal bowel sounds, no pulsatile mass Extremities: 2+ femoral pulses   DATA:  CT angiogram the abdomen and pelvis images were reviewed  today. This is from a CT scan from earlier today at [place]. Current aneurysm diameter is  5 cm.  There no  evidence of endoleak. The top portion of the stent graft is adjacent to the renal arteries and there is no evidence of migration. 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   ASSESSMENT:  Doing well status post Gore Excluder stent graft repair aneurysm   PLAN: Patient will return in 5 months to review his stent graft. He is cleared to return to all exercise activities.  Ruta Hinds, MD [facility] [place] Office: [phone] Pager: [phone]

## 2014-12-02 ENCOUNTER — Ambulatory Visit: Payer: No Typology Code available for payment source | Admitting: Internal Medicine

## 2014-12-02 ENCOUNTER — Other Ambulatory Visit (HOSPITAL_COMMUNITY): Payer: No Typology Code available for payment source

## 2014-12-02 ENCOUNTER — Ambulatory Visit: Payer: No Typology Code available for payment source | Admitting: Family

## 2014-12-02 ENCOUNTER — Encounter (HOSPITAL_COMMUNITY): Payer: No Typology Code available for payment source

## 2015-03-03 ENCOUNTER — Other Ambulatory Visit: Payer: No Typology Code available for payment source

## 2015-03-10 ENCOUNTER — Ambulatory Visit: Payer: No Typology Code available for payment source | Admitting: Vascular Surgery

## 2015-09-21 ENCOUNTER — Encounter (HOSPITAL_COMMUNITY): Payer: Self-pay | Admitting: Vascular Surgery

## 2015-09-21 ENCOUNTER — Emergency Department (HOSPITAL_COMMUNITY)
Admission: EM | Admit: 2015-09-21 | Discharge: 2015-09-21 | Disposition: A | Discharge status: Home / Self Care | Payer: Medicare HMO | Attending: Emergency Medicine | Admitting: Emergency Medicine

## 2015-09-21 DIAGNOSIS — F17210 Nicotine dependence, cigarettes, uncomplicated: Secondary | ICD-10-CM | POA: Insufficient documentation

## 2015-09-21 DIAGNOSIS — I10 Essential (primary) hypertension: Secondary | ICD-10-CM | POA: Diagnosis not present

## 2015-09-21 DIAGNOSIS — Z79899 Other long term (current) drug therapy: Secondary | ICD-10-CM | POA: Insufficient documentation

## 2015-09-21 DIAGNOSIS — E785 Hyperlipidemia, unspecified: Secondary | ICD-10-CM | POA: Diagnosis not present

## 2015-09-21 DIAGNOSIS — E878 Other disorders of electrolyte and fluid balance, not elsewhere classified: Secondary | ICD-10-CM | POA: Insufficient documentation

## 2015-09-21 DIAGNOSIS — M6281 Muscle weakness (generalized): Secondary | ICD-10-CM | POA: Insufficient documentation

## 2015-09-21 DIAGNOSIS — Z7982 Long term (current) use of aspirin: Secondary | ICD-10-CM | POA: Diagnosis not present

## 2015-09-21 DIAGNOSIS — R29898 Other symptoms and signs involving the musculoskeletal system: Secondary | ICD-10-CM

## 2015-09-21 DIAGNOSIS — M79604 Pain in right leg: Secondary | ICD-10-CM | POA: Diagnosis present

## 2015-09-21 DIAGNOSIS — M545 Low back pain: Secondary | ICD-10-CM | POA: Diagnosis not present

## 2015-09-21 NOTE — Discharge Instructions (Signed)
Peripheral Vascular Disease Peripheral vascular disease (PVD) is a disease of the blood vessels that are not part of your heart and brain. A simple term for PVD is poor circulation. In most cases, PVD narrows the blood vessels that carry blood from your heart to the rest of your body. This can result in a decreased supply of blood to your arms, legs, and internal organs, like your stomach or kidneys. However, it most often affects a person's lower legs and feet. There are two types of PVD.  Organic PVD. This is the more common type. It is caused by damage to the structure of blood vessels.  Functional PVD. This is caused by conditions that make blood vessels contract and tighten (spasm). Without treatment, PVD tends to get worse over time. PVD can also lead to acute ischemic limb. This is when an arm or limb suddenly has trouble getting enough blood. This is a medical emergency. CAUSES Each type of PVD has many different causes. The most common cause of PVD is buildup of a fatty material (plaque) inside of your arteries (atherosclerosis). Small amounts of plaque can break off from the walls of the blood vessels and become lodged in a smaller artery. This blocks blood flow and can cause acute ischemic limb. Other common causes of PVD include:  Blood clots that form inside of blood vessels.  Injuries to blood vessels.  Diseases that cause inflammation of blood vessels or cause blood vessel spasms.  Health behaviors and health history that increase your risk of developing PVD. RISK FACTORS  You may have a greater risk of PVD if you:  Have a family history of PVD.  Have certain medical conditions, including:  High cholesterol.  Diabetes.  High blood pressure (hypertension).  Coronary heart disease.  Past problems with blood clots.  Past injury, such as burns or a broken bone. These may have damaged blood vessels in your limbs.  Buerger disease. This is caused by inflamed blood  vessels in your hands and feet.  Some forms of arthritis.  Rare birth defects that affect the arteries in your legs.  Use tobacco.  Do not get enough exercise.  Are obese.  Are age 50 or older. SIGNS AND SYMPTOMS  PVD may cause many different symptoms. Your symptoms depend on what part of your body is not getting enough blood. Some common signs and symptoms include:  Cramps in your lower legs. This may be a symptom of poor leg circulation (claudication).  Pain and weakness in your legs while you are physically active that goes away when you rest (intermittent claudication).  Leg pain when at rest.  Leg numbness, tingling, or weakness.  Coldness in a leg or foot, especially when compared with the other leg.  Skin or hair changes. These can include:  Hair loss.  Shiny skin.  Pale or bluish skin.  Thick toenails.  Inability to get or maintain an erection (erectile dysfunction). People with PVD are more prone to developing ulcers and sores on their toes, feet, or legs. These may take longer than normal to heal. DIAGNOSIS Your health care provider may diagnose PVD from your signs and symptoms. The health care provider will also do a physical exam. You may have tests to find out what is causing your PVD and determine its severity. Tests may include:  Blood pressure recordings from your arms and legs and measurements of the strength of your pulses (pulse volume recordings).  Imaging studies using sound waves to take pictures of   the blood flow through your blood vessels (Doppler ultrasound).  Injecting a dye into your blood vessels before having imaging studies using:  X-rays (angiogram or arteriogram).  Computer-generated X-rays (CT angiogram).  A powerful electromagnetic field and a computer (magnetic resonance angiogram or MRA). TREATMENT Treatment for PVD depends on the cause of your condition and the severity of your symptoms. It also depends on your age. Underlying  causes need to be treated and controlled. These include long-lasting (chronic) conditions, such as diabetes, high cholesterol, and high blood pressure. You may need to first try making lifestyle changes and taking medicines. Surgery may be needed if these do not work. Lifestyle changes may include:  Quitting smoking.  Exercising regularly.  Following a low-fat, low-cholesterol diet. Medicines may include:  Blood thinners to prevent blood clots.  Medicines to improve blood flow.  Medicines to improve your blood cholesterol levels. Surgical procedures may include:  A procedure that uses an inflated balloon to open a blocked artery and improve blood flow (angioplasty).  A procedure to put in a tube (stent) to keep a blocked artery open (stent implant).  Surgery to reroute blood flow around a blocked artery (peripheral bypass surgery).  Surgery to remove dead tissue from an infected wound on the affected limb.  Amputation. This is surgical removal of the affected limb. This may be necessary in cases of acute ischemic limb that are not improved through medical or surgical treatments. HOME CARE INSTRUCTIONS  Take medicines only as directed by your health care provider.  Do not use any tobacco products, including cigarettes, chewing tobacco, or electronic cigarettes. If you need help quitting, ask your health care provider.  Lose weight if you are overweight, and maintain a healthy weight as directed by your health care provider.  Eat a diet that is low in fat and cholesterol. If you need help, ask your health care provider.  Exercise regularly. Ask your health care provider to suggest some good activities for you.  Use compression stockings or other mechanical devices as directed by your health care provider.  Take good care of your feet.  Wear comfortable shoes that fit well.  Check your feet often for any cuts or sores. SEEK MEDICAL CARE IF:  You have cramps in your legs  while walking.  You have leg pain when you are at rest.  You have coldness in a leg or foot.  Your skin changes.  You have erectile dysfunction.  You have cuts or sores on your feet that are not healing. SEEK IMMEDIATE MEDICAL CARE IF:  Your arm or leg turns cold and blue.  Your arms or legs become red, warm, swollen, painful, or numb.  You have chest pain or trouble breathing.  You suddenly have weakness in your face, arm, or leg.  You become very confused or lose the ability to speak.  You suddenly have a very bad headache or lose your vision.   This information is not intended to replace advice given to you by your health care provider. Make sure you discuss any questions you have with your health care provider.   Document Released: 06/21/2004 Document Revised: 06/04/2014 Document Reviewed: 10/22/2013 Elsevier Interactive Patient Education 2016 [employer] Inc[employer]  

## 2015-09-21 NOTE — ED Notes (Signed)
Pt reports to the ED for eval of right leg pain. States he feels like a muscle strain. No swelling or erythema noted. Pt reports pain started this evening while he was walking to work. States he walk a lot for work as well. Denies any recent injury. Pt ambulatory without difficulty. Pt A&Ox4, resp e/u, and skin warm and dry.

## 2015-09-21 NOTE — ED Provider Notes (Signed)
CSN: [id number]     Arrival date & time 09/21/15  1913 History  By signing my name below, I, Randa Evens, attest that this documentation has been prepared under the direction and in the presence of Delsa Grana, PA-C. Electronically Signed: Randa Evens, ED Scribe. 09/21/2015. 8:08 PM.    Chief Complaint  Patient presents with  . Leg Pain    Patient is a 65 y.o. male presenting with leg pain. The history is provided by the patient. No language interpreter was used.  Leg Pain  HPI Comments: Eric Barnett is a 65 y.o. male who presents to the Emergency Department complaining of upper right leg pain onset 2 weeks prior. Pt states that he feels as if the leg is "giving out" on him. Pt reports some slight low back pain. Pt states that he feels this way when walking up a slight incline. Pt denies injury or trauma to the leg. Denies fall, color change or swelling. Pt reports Hx of abdominal aortic endovascular stent.  He denies chest pain, abdominal pain, back pain.  Past Medical History  Diagnosis Date  . Hypertension   . Hyperchloremia   . AAA (abdominal aortic aneurysm) ([place])   . Hyperlipidemia   . Vitamin D deficiency   . Irregular heartbeat    Past Surgical History  Procedure Laterality Date  . Hernia repair    . Tonsillectomy    . Abdominal aortic endovascular stent graft N/A 08/30/2014    Procedure: ABDOMINAL AORTIC ENDOVASCULAR STENT GRAFT;  Surgeon: Elam Dutch, MD;  Location: [facility] OR;  Service: Vascular;  Laterality: N/A;   No family history on file. Social History  Substance Use Topics  . Smoking status: Current Some Day Smoker -- 17 years    Types: Cigarettes  . Smokeless tobacco: Former Systems developer    Quit date: 01/20/1974  . Alcohol Use: No    Review of Systems  Musculoskeletal: Negative for joint swelling.  Skin: Negative for color change.  All other systems reviewed and are negative.    Allergies  Aspirin  Home Medications   Prior to Admission medications    Medication Sig Start Date End Date Taking? Authorizing Provider  aspirin 81 MG tablet Take 81 mg by mouth as needed (Pt can take low dose).     Historical Provider, MD  atorvastatin (LIPITOR) 40 MG tablet Take 1 tablet (40 mg total) by mouth daily. 08/24/14   Lorretta Harp, MD  dexamethasone (DECADRON) 1 MG tablet Take 1 tablet by mouth once at 11 pm, before coming for labs at 8 am the next morning 08/18/14   Philemon Kingdom, MD  losartan-hydrochlorothiazide (HYZAAR) 100-12.5 MG per tablet Take 1 tablet by mouth daily.    Historical Provider, MD  oxyCODONE-acetaminophen (PERCOCET/ROXICET) 5-325 MG per tablet Take 1-2 tablets by mouth every 4 (four) hours as needed for moderate pain. Patient not taking: Reported on 09/30/2014 08/31/14   Ulyses Amor, PA-C  traMADol (ULTRAM) 50 MG tablet Take 1 tablet (50 mg total) by mouth every 6 (six) hours as needed. 09/02/14   Elam Dutch, MD   BP 124/97 mmHg  Pulse 99  Temp(Src) 97.7 F (36.5 C) (Oral)  Resp 16  Wt 74.027 kg  SpO2 100%   Physical Exam  Constitutional: He is oriented to person, place, and time. He appears well-developed and well-nourished. No distress.  HENT:  Head: Normocephalic and atraumatic.  Right Ear: External ear normal.  Left Ear: External ear normal.  Nose: Nose normal.  Mouth/Throat: Oropharynx is clear and moist. No oropharyngeal exudate.  Eyes: Conjunctivae and EOM are normal. Pupils are equal, round, and reactive to light. Right eye exhibits no discharge. Left eye exhibits no discharge. No scleral icterus.  Neck: Normal range of motion. Neck supple. No JVD present. No tracheal deviation present.  Cardiovascular: Normal rate and regular rhythm.   Pulses in bilateral lower extremities not palpable, found with Doppler, amplitude of posterior tibialis pulse on the right decreased when compared to posterior tibialis pulse on the left, LE warm to the touch, capillary refill < 3 sec, no ulcers, no edema, no erythema   Pulmonary/Chest: Effort normal and breath sounds normal. No stridor. No respiratory distress.  Musculoskeletal: Normal range of motion. He exhibits no edema.  Lymphadenopathy:    He has no cervical adenopathy.  Neurological: He is alert and oriented to person, place, and time. He exhibits normal muscle tone. Coordination normal.  Skin: Skin is warm and dry. No rash noted. He is not diaphoretic. No erythema. No pallor.  Psychiatric: He has a normal mood and affect. His behavior is normal. Judgment and thought content normal.  Nursing note and vitals reviewed.   ED Course  Procedures (including critical care time) DIAGNOSTIC STUDIES: Oxygen Saturation is 100% on RA, normal by my interpretation.    COORDINATION OF CARE: 8:07 PM-Discussed treatment plan with pt at bedside and pt agreed to plan.     Labs Review Labs Reviewed - No data to display  Imaging Review No results found.    EKG Interpretation None      MDM    65 year-old male with history of AAA with repair one year ago. He presents with 2 weeks of intermittent exertional right leg weakness, he denies injury, pain, numbness, paresthesias, color change, edema, erythema, ulcers.  Pulses found by Doppler, PT pulse on right decreased when compared to left, suspect patient's leg weakness and symptoms described as "giving out on him" may be a claudication equivalent.  He does not have any pallor, pulselessness, paresthesias, do not suspect acute limits ischemia, encouraged [name] to follow-up with vascular surgeon as soon as possible. Return precautions were reviewed with him.  He was encouraged to continue to take baby aspirin daily and quit smoking.   Final diagnoses:  Transient right leg weakness     I personally performed the services described in this documentation, which was scribed in my presence. The recorded information has been reviewed and is accurate.        Delsa Grana, PA-C 09/26/15 [place], MD 09/29/15 [phone]

## 2015-10-17 ENCOUNTER — Other Ambulatory Visit: Payer: Self-pay | Admitting: Vascular Surgery

## 2015-10-17 DIAGNOSIS — I714 Abdominal aortic aneurysm, without rupture, unspecified: Secondary | ICD-10-CM

## 2015-10-20 ENCOUNTER — Encounter (HOSPITAL_COMMUNITY): Payer: Self-pay | Admitting: Emergency Medicine

## 2015-10-20 ENCOUNTER — Emergency Department (HOSPITAL_COMMUNITY)
Admission: EM | Admit: 2015-10-20 | Discharge: 2015-10-20 | Disposition: A | Discharge status: Home / Self Care | Payer: Medicare HMO | Attending: Emergency Medicine | Admitting: Emergency Medicine

## 2015-10-20 DIAGNOSIS — H538 Other visual disturbances: Secondary | ICD-10-CM | POA: Insufficient documentation

## 2015-10-20 DIAGNOSIS — H109 Unspecified conjunctivitis: Secondary | ICD-10-CM | POA: Diagnosis present

## 2015-10-20 DIAGNOSIS — I10 Essential (primary) hypertension: Secondary | ICD-10-CM | POA: Diagnosis not present

## 2015-10-20 DIAGNOSIS — E785 Hyperlipidemia, unspecified: Secondary | ICD-10-CM | POA: Insufficient documentation

## 2015-10-20 DIAGNOSIS — F17210 Nicotine dependence, cigarettes, uncomplicated: Secondary | ICD-10-CM | POA: Insufficient documentation

## 2015-10-20 DIAGNOSIS — Z7982 Long term (current) use of aspirin: Secondary | ICD-10-CM | POA: Diagnosis not present

## 2015-10-20 DIAGNOSIS — Z79899 Other long term (current) drug therapy: Secondary | ICD-10-CM | POA: Diagnosis not present

## 2015-10-20 MED ORDER — FLUORESCEIN SODIUM 1 MG OP STRP
1.0000 | ORAL_STRIP | Freq: Once | OPHTHALMIC | Status: AC
  Administered 2015-10-20: 1 via OPHTHALMIC
  Filled 2015-10-20: qty 1

## 2015-10-20 MED ORDER — TETRACAINE HCL 0.5 % OP SOLN
1.0000 [drp] | Freq: Once | OPHTHALMIC | Status: AC
  Administered 2015-10-20: 1 [drp] via OPHTHALMIC
  Filled 2015-10-20: qty 2

## 2015-10-20 MED ORDER — TOBRAMYCIN 0.3 % OP SOLN
2.0000 [drp] | Freq: Four times a day (QID) | OPHTHALMIC | Status: DC
  Administered 2015-10-20: 2 [drp] via OPHTHALMIC
  Filled 2015-10-20: qty 5

## 2015-10-20 NOTE — ED Provider Notes (Signed)
CSN: [id number]     Arrival date & time 10/20/15  1905 History  By signing my name below, I, Rayna Sexton, attest that this documentation has been prepared under the direction and in the presence of Debroah Baller, NP. Electronically Signed: Rayna Sexton, ED Scribe. 10/20/2015. 8:06 PM.   Chief Complaint  Patient presents with  . Conjunctivitis    The patient said he woke up and his right eye was draining some watery and purulent discharge.  He says he feels like "something hit" his right eye.  He denies any pain.   Patient is a 65 y.o. male presenting with conjunctivitis. The history is provided by the patient. No language interpreter was used.  Conjunctivitis This is a new problem. The current episode started more than 2 days ago. The problem occurs constantly. The problem has not changed since onset.Nothing aggravates the symptoms. Nothing relieves the symptoms. He has tried nothing for the symptoms. The treatment provided no relief.   HPI Comments: Eric Barnett is a 65 y.o. male with a PMHx of HTN who presents to the Emergency Department complaining of constant, mild, right eye drainage x 1 week. He states that he initially felt as if he had a foreign object in the affected eye with his symptoms worsening to include associated itchiness, eye matting, blurry vision, eye redness and eye watering. He confirms wearing rx glasses and denies having seen an optometrist for a "long time". Pt denies a PMHx of DM. Pt denies having taken his rx medication for HTN today. He denies eye pain or any other associated symptoms at this time.    Past Medical History  Diagnosis Date  . Hypertension   . Hyperchloremia   . AAA (abdominal aortic aneurysm) ([place])   . Hyperlipidemia   . Vitamin D deficiency   . Irregular heartbeat    Past Surgical History  Procedure Laterality Date  . Hernia repair    . Tonsillectomy    . Abdominal aortic endovascular stent graft N/A 08/30/2014    Procedure: ABDOMINAL AORTIC  ENDOVASCULAR STENT GRAFT;  Surgeon: Elam Dutch, MD;  Location: [facility];  Service: Vascular;  Laterality: N/A;   History reviewed. No pertinent family history. Social History  Substance Use Topics  . Smoking status: Current Some Day Smoker -- 17 years    Types: Cigarettes  . Smokeless tobacco: Former Systems developer    Quit date: 01/20/1974  . Alcohol Use: No    Review of Systems  Eyes: Positive for discharge, redness, itching and visual disturbance. Negative for pain.  All other systems reviewed and are negative.   Allergies  Aspirin  Home Medications   Prior to Admission medications   Medication Sig Start Date End Date Taking? Authorizing Provider  aspirin 81 MG tablet Take 81 mg by mouth as needed (Pt can take low dose).     Historical Provider, MD  atorvastatin (LIPITOR) 40 MG tablet Take 1 tablet (40 mg total) by mouth daily. 08/24/14   Lorretta Harp, MD  dexamethasone (DECADRON) 1 MG tablet Take 1 tablet by mouth once at 11 pm, before coming for labs at 8 am the next morning 08/18/14   Philemon Kingdom, MD  losartan-hydrochlorothiazide (HYZAAR) 100-12.5 MG per tablet Take 1 tablet by mouth daily.    Historical Provider, MD  oxyCODONE-acetaminophen (PERCOCET/ROXICET) 5-325 MG per tablet Take 1-2 tablets by mouth every 4 (four) hours as needed for moderate pain. Patient not taking: Reported on 09/30/2014 08/31/14   Ulyses Amor, PA-C  traMADol (ULTRAM) 50 MG tablet Take 1 tablet (50 mg total) by mouth every 6 (six) hours as needed. 09/02/14   Elam Dutch, MD   BP 140/94 mmHg  Pulse 89  Temp(Src) 98.2 F (36.8 C) (Oral)  Resp 18  SpO2 100%    Physical Exam  Constitutional: He is oriented to person, place, and time. He appears well-developed and well-nourished. No distress.  HENT:  Head: Normocephalic and atraumatic.  Eyes: EOM are normal. Pupils are equal, round, and reactive to light. Lids are everted and swept, no foreign bodies found. Right eye exhibits discharge. Right  eye exhibits no hordeolum. No foreign body present in the right eye. Right conjunctiva is injected.  Slit lamp exam:      The right eye shows no corneal abrasion, no corneal ulcer, no foreign body, no hyphema and no fluorescein uptake.  Visual Acuity Right Eye Distance: 20/50 Left Eye Distance: 20/30 Bilateral Distance: 20/20  Tetracaine one drop to the right eye. Stained with fluorescence Slit lamp exam Tobramycin Opth. Drops to the right eye   Neck: Normal range of motion.  Cardiovascular: Normal rate.   Pulmonary/Chest: Effort normal. No respiratory distress.  Abdominal: Soft.  Musculoskeletal: Normal range of motion.  Neurological: He is alert and oriented to person, place, and time.  Skin: Skin is warm and dry.  Psychiatric: He has a normal mood and affect. His behavior is normal.  Nursing note and vitals reviewed.   ED Course  Procedures  DIAGNOSTIC STUDIES: Oxygen Saturation is 100% on RA, normal by my interpretation.    COORDINATION OF CARE: 8:04 PM Discussed next steps with pt. He verbalized understanding and is agreeable with the plan.    MDM  65 y.o. male with right eye drainage and itching x 1 weeks stable for d/c without corneal abrasion or f/b noted in the right eye. Will treat for conjunctivitis with Tobramycin Opth Drops and he will call Dr. Zenia Resides office tomorrow for follow up.   Final diagnoses:  Conjunctivitis of right eye   I personally performed the services described in this documentation, which was scribed in my presence. The recorded information has been reviewed and is accurate.    [place], NP 10/21/15 [id number]  Harvel Quale, MD 10/23/15 [phone]

## 2015-10-20 NOTE — Discharge Instructions (Signed)
Use the drops 2 drops in right eye 4 times a day. Call Dr. Zenia Resides office in the morning to scheduled follow up. Return here as needed.   Bacterial Conjunctivitis Bacterial conjunctivitis, commonly called pink eye, is an inflammation of the clear membrane that covers the white part of the eye (conjunctiva). The inflammation can also happen on the underside of the eyelids. The blood vessels in the conjunctiva become inflamed, causing the eye to become red or pink. Bacterial conjunctivitis may spread easily from one eye to another and from person to person (contagious).  CAUSES  Bacterial conjunctivitis is caused by bacteria. The bacteria may come from your own skin, your upper respiratory tract, or from someone else with bacterial conjunctivitis. SYMPTOMS  The normally white color of the eye or the underside of the eyelid is usually pink or red. The pink eye is usually associated with irritation, tearing, and some sensitivity to light. Bacterial conjunctivitis is often associated with a thick, yellowish discharge from the eye. The discharge may turn into a crust on the eyelids overnight, which causes your eyelids to stick together. If a discharge is present, there may also be some blurred vision in the affected eye. DIAGNOSIS  Bacterial conjunctivitis is diagnosed by your caregiver through an eye exam and the symptoms that you report. Your caregiver looks for changes in the surface tissues of your eyes, which may point to the specific type of conjunctivitis. A sample of any discharge may be collected on a cotton-tip swab if you have a severe case of conjunctivitis, if your cornea is affected, or if you keep getting repeat infections that do not respond to treatment. The sample will be sent to a lab to see if the inflammation is caused by a bacterial infection and to see if the infection will respond to antibiotic medicines. TREATMENT   Bacterial conjunctivitis is treated with antibiotics. Antibiotic  eyedrops are most often used. However, antibiotic ointments are also available. Antibiotics pills are sometimes used. Artificial tears or eye washes may ease discomfort. HOME CARE INSTRUCTIONS   To ease discomfort, apply a cool, clean washcloth to your eye for 10-20 minutes, 3-4 times a day.  Gently wipe away any drainage from your eye with a warm, wet washcloth or a cotton ball.  Wash your hands often with soap and water. Use paper towels to dry your hands.  Do not share towels or washcloths. This may spread the infection.  Change or wash your pillowcase every day.  You should not use eye makeup until the infection is gone.  Do not operate machinery or drive if your vision is blurred.  Stop using contact lenses. Ask your caregiver how to sterilize or replace your contacts before using them again. This depends on the type of contact lenses that you use.  When applying medicine to the infected eye, do not touch the edge of your eyelid with the eyedrop bottle or ointment tube. SEEK IMMEDIATE MEDICAL CARE IF:   Your infection has not improved within 3 days after beginning treatment.  You had yellow discharge from your eye and it returns.  You have increased eye pain.  Your eye redness is spreading.  Your vision becomes blurred.  You have a fever or persistent symptoms for more than 2-3 days.  You have a fever and your symptoms suddenly get worse.  You have facial pain, redness, or swelling. MAKE SURE YOU:   Understand these instructions.  Will watch your condition.  Will get help right away if  you are not doing well or get worse.   This information is not intended to replace advice given to you by your health care provider. Make sure you discuss any questions you have with your health care provider.   Document Released: 05/14/2005 Document Revised: 06/04/2014 Document Reviewed: 10/15/2011 Elsevier Interactive Patient Education [employer].

## 2015-10-20 NOTE — ED Notes (Signed)
The patient said he woke up and his right eye was draining some watery and purulent discharge.  He says he feels like "something hit" his right eye.  He denies any pain.

## 2015-10-20 NOTE — ED Notes (Signed)
Patient is alert and orientedx4.  Patient was explained discharge instructions and they understood them with no questions.   

## 2015-10-27 ENCOUNTER — Other Ambulatory Visit: Payer: Self-pay | Admitting: *Deleted

## 2015-10-27 DIAGNOSIS — I739 Peripheral vascular disease, unspecified: Secondary | ICD-10-CM

## 2015-12-12 ENCOUNTER — Inpatient Hospital Stay: Admission: RE | Admit: 2015-12-12 | Payer: Medicare HMO | Source: Ambulatory Visit

## 2015-12-15 ENCOUNTER — Encounter: Payer: Self-pay | Admitting: Vascular Surgery

## 2015-12-22 ENCOUNTER — Ambulatory Visit: Payer: Medicare HMO | Admitting: Vascular Surgery

## 2015-12-22 ENCOUNTER — Ambulatory Visit (HOSPITAL_COMMUNITY): Admission: RE | Admit: 2015-12-22 | Payer: Medicare HMO | Source: Ambulatory Visit

## 2016-02-12 ENCOUNTER — Emergency Department (HOSPITAL_COMMUNITY)
Admission: EM | Admit: 2016-02-12 | Discharge: 2016-02-12 | Disposition: A | Discharge status: Home / Self Care | Payer: Medicare HMO | Attending: Emergency Medicine | Admitting: Emergency Medicine

## 2016-02-12 ENCOUNTER — Encounter (HOSPITAL_COMMUNITY): Payer: Self-pay

## 2016-02-12 DIAGNOSIS — R42 Dizziness and giddiness: Secondary | ICD-10-CM | POA: Insufficient documentation

## 2016-02-12 DIAGNOSIS — F17210 Nicotine dependence, cigarettes, uncomplicated: Secondary | ICD-10-CM | POA: Diagnosis not present

## 2016-02-12 DIAGNOSIS — I159 Secondary hypertension, unspecified: Secondary | ICD-10-CM | POA: Insufficient documentation

## 2016-02-12 DIAGNOSIS — Z7982 Long term (current) use of aspirin: Secondary | ICD-10-CM | POA: Diagnosis not present

## 2016-02-12 LAB — BASIC METABOLIC PANEL
ANION GAP: 6 (ref 5–15)
BUN: 18 mg/dL (ref 6–20)
CALCIUM: 9.9 mg/dL (ref 8.9–10.3)
CHLORIDE: 105 mmol/L (ref 101–111)
CO2: 29 mmol/L (ref 22–32)
Creatinine, Ser: 1.16 mg/dL (ref 0.61–1.24)
GFR calc non Af Amer: >60 mL/min (ref >60)
Glucose, Bld: 103 mg/dL — ABNORMAL HIGH (ref 65–99)
POTASSIUM: 3.7 mmol/L (ref 3.5–5.1)
Sodium: 140 mmol/L (ref 135–145)

## 2016-02-12 LAB — CBC
HEMATOCRIT: 44.3 % (ref 39.0–52.0)
HEMOGLOBIN: 14.7 g/dL (ref 13.0–17.0)
MCH: 31.9 pg (ref 26.0–34.0)
MCHC: 33.2 g/dL (ref 30.0–36.0)
MCV: 96.1 fL (ref 78.0–100.0)
Platelets: 206 10*3/uL (ref 150–400)
RBC: 4.61 MIL/uL (ref 4.22–5.81)
RDW: 12.7 % (ref 11.5–15.5)
WBC: 4.9 10*3/uL (ref 4.0–10.5)

## 2016-02-12 LAB — URINE MICROSCOPIC-ADD ON

## 2016-02-12 LAB — URINALYSIS, ROUTINE W REFLEX MICROSCOPIC
Glucose, UA: NEGATIVE mg/dL
HGB URINE DIPSTICK: NEGATIVE
Ketones, ur: 15 mg/dL — AB
Leukocytes, UA: NEGATIVE
Nitrite: NEGATIVE
Protein, ur: 30 mg/dL — AB
SPECIFIC GRAVITY, URINE: 1.021 (ref 1.005–1.030)
pH: 6.5 (ref 5.0–8.0)

## 2016-02-12 MED ORDER — SODIUM CHLORIDE 0.9 % IV BOLUS (SEPSIS)
1000.0000 mL | Freq: Once | INTRAVENOUS | Status: AC
  Administered 2016-02-12: 1000 mL via INTRAVENOUS

## 2016-02-12 MED ORDER — LOSARTAN POTASSIUM-HCTZ 100-12.5 MG PO TABS: 1.0000 | ORAL_TABLET | Freq: Every day | ORAL | 0 refills | Status: DC

## 2016-02-12 NOTE — Discharge Instructions (Signed)
Medications: Hyzaar  Treatment: Restart your blood pressure medications. Make sure to drink plenty of water, at least 8, 8oz glasses of water daily.  Follow-up: Please follow-up with your primary care provider for further evaluation and treatment of your symptoms. Please return to emergency department if you develop any new or worsening symptoms including worsening lightheadedness or dizziness, chest pain, shortness of breath, numbness or tingling, or any other concerning symptoms.

## 2016-02-12 NOTE — ED Provider Notes (Signed)
Eric Barnett Provider Note   CSN: [id number] Arrival date & time: 02/12/16  1019     History   Chief Complaint Chief Complaint  Patient presents with  . Dizziness    HPI Sung Theune is a 65 y.o. male with history of AAA, hypertension who presents with lightheadedness. Patient describes his lightheadedness as occurring only when he bends down to pick something up. He states when he stands up, it resolves within a few seconds. Patient states it's been happening since his blood pressure has been elevated due to him being out of his medicine over the past month. He states it his lightheadedness began getting worse yesterday. He denies any headaches, vertigo, change in vision, numbness, tingling. Patient has chronic, intermittent weakness of his right leg that has been worked up by his PCP. Denies any chest pain, shortness of breath, abdominal pain, nausea, vomiting, new urinary symptoms.  HPI  Past Medical History:  Diagnosis Date  . AAA (abdominal aortic aneurysm) ([place])   . Hyperchloremia   . Hyperlipidemia   . Hypertension   . Irregular heartbeat   . Vitamin D deficiency     Patient Active Problem List   Diagnosis Date Noted  . Adrenal mass, left ([place]) 09/30/2014  . Adrenal mass, right ([place]) 09/30/2014  . AAA (abdominal aortic aneurysm) ([place]) 08/30/2014  . 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 AAA (abdominal aortic aneurysm) without rupture ([place]) 01/20/2014  . Abdominal aortic aneurysm ([place]) 01/15/2014  . Irregular heart rate 01/15/2014  . OBESITY 06/02/2010  . TOBACCO ABUSE 06/02/2010  . URINALYSIS, ABNORMAL 12/15/2009  . PARESTHESIA 02/15/2009  . DEGENERATIVE JOINT DISEASE, HIPS 09/27/2008  . DERMATOPHYTOSIS, NAIL 05/16/2007  . COCAINE ABUSE, HX OF 01/21/2007  . INGUINAL HERNIORRHAPHIES, BILATERAL, HX OF 01/21/2007  . DYSLIPIDEMIA 01/18/2007  . Essential hypertension 01/18/2007  . PERIODONTAL DISEASE 01/18/2007  . INGUINAL HERNIA, HX OF 01/18/2007     Past Surgical History:  Procedure Laterality Date  . ABDOMINAL AORTIC ENDOVASCULAR STENT GRAFT N/A 08/30/2014   Procedure: ABDOMINAL AORTIC ENDOVASCULAR STENT GRAFT;  Surgeon: Elam Dutch, MD;  Location: [facility];  Service: Vascular;  Laterality: N/A;  . HERNIA REPAIR    . TONSILLECTOMY         Home Medications    Prior to Admission medications   Medication Sig Start Date End Date Taking? Authorizing Provider  aspirin 81 MG tablet Take 81 mg by mouth as needed (Pt can take low dose).     Historical Provider, MD  atorvastatin (LIPITOR) 40 MG tablet Take 1 tablet (40 mg total) by mouth daily. Patient not taking: Reported on 02/12/2016 08/24/14   Lorretta Harp, MD  dexamethasone (DECADRON) 1 MG tablet Take 1 tablet by mouth once at 11 pm, before coming for labs at 8 am the next morning Patient not taking: Reported on 02/12/2016 08/18/14   Philemon Kingdom, MD  losartan-hydrochlorothiazide (HYZAAR) 100-12.5 MG tablet Take 1 tablet by mouth daily. 02/12/16   Frederica Kuster, PA-C  oxyCODONE-acetaminophen (PERCOCET/ROXICET) 5-325 MG per tablet Take 1-2 tablets by mouth every 4 (four) hours as needed for moderate pain. Patient not taking: Reported on 02/12/2016 08/31/14   Ulyses Amor, PA-C  traMADol (ULTRAM) 50 MG tablet Take 1 tablet (50 mg total) by mouth every 6 (six) hours as needed. Patient not taking: Reported on 02/12/2016 09/02/14   Elam Dutch, MD    Family History No family history on file.  Social History Social History  Substance Use Topics  .  Smoking status: Current Some Day Smoker    Packs/day: 0.25    Years: 17.00    Types: Cigarettes  . Smokeless tobacco: Former Systems developer    Quit date: 01/20/1974  . Alcohol use 0.0 oz/week     Allergies   Aspirin   Review of Systems Review of Systems  Constitutional: Negative for chills and fever.  HENT: Negative for facial swelling and sore throat.   Eyes: Negative for visual disturbance.  Respiratory: Negative for  shortness of breath.   Cardiovascular: Negative for chest pain.  Gastrointestinal: Negative for abdominal pain, nausea and vomiting.  Genitourinary: Negative for dysuria.  Musculoskeletal: Negative for back pain.  Skin: Negative for rash and wound.  Neurological: Positive for light-headedness. Negative for headaches.  Psychiatric/Behavioral: The patient is not nervous/anxious.      Physical Exam Updated Vital Signs BP (!) 171/111   Pulse 72   Temp 97.8 F (36.6 C) (Oral)   Resp 18   Ht 6\' 3"  (1.905 m)   Wt 77.1 kg   SpO2 100%   BMI 21.25 kg/m   Physical Exam  Constitutional: He appears well-developed and well-nourished. No distress.  HENT:  Head: Normocephalic and atraumatic.  Right Ear: Tympanic membrane normal.  Left Ear: Tympanic membrane normal.  Eyes: Conjunctivae and EOM are normal. Pupils are equal, round, and reactive to light. Right eye exhibits no discharge. Left eye exhibits no discharge. No scleral icterus.  Neck: Normal range of motion. Neck supple. No thyromegaly present.  Cardiovascular: Normal rate, regular rhythm, normal heart sounds and intact distal pulses.  Exam reveals no gallop and no friction rub.   No murmur heard. Pulmonary/Chest: Effort normal and breath sounds normal. No stridor. No respiratory distress. He has no wheezes. He has no rales.  Abdominal: Soft. Bowel sounds are normal. He exhibits no distension. There is no tenderness. There is no rebound and no guarding.  Musculoskeletal: He exhibits no edema.  Lymphadenopathy:    He has no cervical adenopathy.  Neurological: He is alert. Coordination normal.  CN 3-12 intact; normal sensation throughout; 5/5 strength in all 4 extremities; equal bilateral grip strength; no ataxia on finger to nose   Skin: Skin is warm and dry. No rash noted. He is not diaphoretic. No pallor.  Psychiatric: He has a normal mood and affect.  Nursing note and vitals reviewed.    ED Treatments / Results  Labs (all  labs ordered are listed, but only abnormal results are displayed) Labs Reviewed  BASIC METABOLIC PANEL - Abnormal; Notable for the following:       Result Value   Glucose, Bld 103 (*)    All other components within normal limits  URINALYSIS, ROUTINE W REFLEX MICROSCOPIC (NOT AT [facility]) - Abnormal; Notable for the following:    APPearance CLOUDY (*)    Bilirubin Urine SMALL (*)    Ketones, ur 15 (*)    Protein, ur 30 (*)    All other components within normal limits  URINE MICROSCOPIC-ADD ON - Abnormal; Notable for the following:    Squamous Epithelial / LPF 0-5 (*)    Bacteria, UA MANY (*)    All other components within normal limits  CBC  CBG MONITORING, ED    EKG  EKG Interpretation  Date/Time:  Sunday February 12 2016 10:48:11 EDT Ventricular Rate:  77 PR Interval:  172 QRS Duration: 110 QT Interval:  432 QTC Calculation: 488 R Axis:   -70 Text Interpretation:  Sinus rhythm with occasional Premature ventricular complexes  Left anterior fascicular block Septal infarct , age undetermined ST & T wave abnormality, consider inferior ischemia Abnormal ECG no significant change since 2011 Confirmed by GOLDSTON MD, SCOTT [phone]) on 02/12/2016 11:01:34 AM       Radiology No results found.  Procedures Procedures (including critical care time)  Medications Ordered in ED Medications  sodium chloride 0.9 % bolus 1,000 mL (0 mLs Intravenous Stopped 02/12/16 1507)     Initial Impression / Assessment and Plan / ED Course  I have reviewed the triage vital signs and the nursing notes.  Pertinent labs & imaging results that were available during my care of the patient were reviewed by me and considered in my medical decision making (see chart for details).  Clinical Course    CBC, BMP unremarkable. EKG shows Sinus rhythm with occasional Premature ventricular complexes, Left anterior fascicular block, Septal infarct , age undetermined, ST & T wave abnormality, consider inferior  ischemia, no significant change since 2011. Orthostatic vital signs stable. Normal neuro exam, no focal deficits. Patient asymptomatic throughout ED course. Will refill Hyzaar for blood pressure treatment. Follow-up to PCP for further evaluation and treatment. Patient understands and agrees with plan. Patient vitals stable throughout ED course and discharged in satisfactory condition. Patient also evaluated by Dr. Regenia Skeeter who agrees with plan.  Final Clinical Impressions(s) / ED Diagnoses   Final diagnoses:  Vertigo  Lightheadedness  Secondary hypertension, unspecified    New Prescriptions Discharge Medication List as of 02/12/2016  2:53 PM       Frederica Kuster, PA-C 02/12/16 [place], MD 02/13/16 [phone]

## 2016-02-12 NOTE — ED Triage Notes (Signed)
Per Pt, Pt started to feel lightheaded and weak last night while trying to tie his shoe. Pt reports running out of BP medication "awhile ago, I don't know." Pt alert and oriented x4 upon arrival. Denies confusion, aphasia. Reports weakness in the right leg "that's been doing it a while." Denies any numbness or tingling. No deficits noted on initial neuro exam.

## 2016-05-10 ENCOUNTER — Encounter (HOSPITAL_COMMUNITY): Payer: Self-pay | Admitting: Emergency Medicine

## 2016-05-10 ENCOUNTER — Emergency Department (HOSPITAL_COMMUNITY)
Admission: EM | Admit: 2016-05-10 | Discharge: 2016-05-10 | Disposition: A | Discharge status: Home / Self Care | Payer: Medicare HMO | Attending: Emergency Medicine | Admitting: Emergency Medicine

## 2016-05-10 DIAGNOSIS — Z7982 Long term (current) use of aspirin: Secondary | ICD-10-CM | POA: Diagnosis not present

## 2016-05-10 DIAGNOSIS — R05 Cough: Secondary | ICD-10-CM | POA: Diagnosis present

## 2016-05-10 DIAGNOSIS — J069 Acute upper respiratory infection, unspecified: Secondary | ICD-10-CM | POA: Diagnosis not present

## 2016-05-10 DIAGNOSIS — I10 Essential (primary) hypertension: Secondary | ICD-10-CM | POA: Insufficient documentation

## 2016-05-10 DIAGNOSIS — F17210 Nicotine dependence, cigarettes, uncomplicated: Secondary | ICD-10-CM | POA: Diagnosis not present

## 2016-05-10 DIAGNOSIS — H9393 Unspecified disorder of ear, bilateral: Secondary | ICD-10-CM | POA: Diagnosis not present

## 2016-05-10 DIAGNOSIS — B9789 Other viral agents as the cause of diseases classified elsewhere: Secondary | ICD-10-CM

## 2016-05-10 MED ORDER — OXYMETAZOLINE HCL 0.05 % NA SOLN: 1.0000 | Freq: Two times a day (BID) | NASAL | 0 refills | Status: DC

## 2016-05-10 MED ORDER — BENZONATATE 100 MG PO CAPS: 200.0000 mg | ORAL_CAPSULE | Freq: Two times a day (BID) | ORAL | 0 refills | Status: DC | PRN

## 2016-05-10 NOTE — ED Triage Notes (Signed)
Pt here for URI with congestion and productive cough with yellow sputum; pt sts some body aches

## 2016-05-10 NOTE — ED Provider Notes (Signed)
[facility] DEPT Provider Note   CSN: [id number] Arrival date & time: 05/10/16  1055  By signing my name below, I, Eric Barnett, attest that this documentation has been prepared under the direction and in the presence of Harlene Ramus, PA-C. Electronically Signed: Sonum Barnett, Education administrator. 05/10/16. 11:30 AM.  History   Chief Complaint Chief Complaint  Patient presents with  . URI    The history is provided by the patient. No language interpreter was used.     HPI Comments: Eric Barnett is a 65 y.o. male who presents to the Emergency Department complaining of 2-3 days of a cough productive of yellow sputum. He has associated generalized myalgias, rhinorrhea, nasal congestion, ear pain, sore throat, and wheezing. He has tried Tylenol without relief. He denies fever, sinus pain/pressure, SOB, vomiting, CP, hemoptysis. He denies known sick contacts. He smokes 2-3 cigarettes daily. He has a history of HTN for which he takes medication; states he is compliant with this.   Past Medical History:  Diagnosis Date  . AAA (abdominal aortic aneurysm) ([place])   . Hyperchloremia   . Hyperlipidemia   . Hypertension   . Irregular heartbeat   . Vitamin D deficiency     Patient Active Problem List   Diagnosis Date Noted  . Adrenal mass, left ([place]) 09/30/2014  . Adrenal mass, right ([place]) 09/30/2014  . AAA (abdominal aortic aneurysm) ([place]) 08/30/2014  . 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 AAA (abdominal aortic aneurysm) without rupture ([place]) 01/20/2014  . Abdominal aortic aneurysm ([place]) 01/15/2014  . Irregular heart rate 01/15/2014  . OBESITY 06/02/2010  . TOBACCO ABUSE 06/02/2010  . URINALYSIS, ABNORMAL 12/15/2009  . PARESTHESIA 02/15/2009  . DEGENERATIVE JOINT DISEASE, HIPS 09/27/2008  . DERMATOPHYTOSIS, NAIL 05/16/2007  . COCAINE ABUSE, HX OF 01/21/2007  . INGUINAL HERNIORRHAPHIES, BILATERAL, HX OF 01/21/2007  . DYSLIPIDEMIA 01/18/2007  . Essential hypertension 01/18/2007  .  PERIODONTAL DISEASE 01/18/2007  . INGUINAL HERNIA, HX OF 01/18/2007    Past Surgical History:  Procedure Laterality Date  . ABDOMINAL AORTIC ENDOVASCULAR STENT GRAFT N/A 08/30/2014   Procedure: ABDOMINAL AORTIC ENDOVASCULAR STENT GRAFT;  Surgeon: Elam Dutch, MD;  Location: [facility];  Service: Vascular;  Laterality: N/A;  . HERNIA REPAIR    . TONSILLECTOMY         Home Medications    Prior to Admission medications   Medication Sig Start Date End Date Taking? Authorizing Provider  aspirin 81 MG tablet Take 81 mg by mouth as needed (Pt can take low dose).     Historical Provider, MD  atorvastatin (LIPITOR) 40 MG tablet Take 1 tablet (40 mg total) by mouth daily. Patient not taking: Reported on 02/12/2016 08/24/14   Lorretta Harp, MD  benzonatate (TESSALON) 100 MG capsule Take 2 capsules (200 mg total) by mouth 2 (two) times daily as needed for cough. 05/10/16   Nona Dell, PA-C  dexamethasone (DECADRON) 1 MG tablet Take 1 tablet by mouth once at 11 pm, before coming for labs at 8 am the next morning Patient not taking: Reported on 02/12/2016 08/18/14   Philemon Kingdom, MD  losartan-hydrochlorothiazide (HYZAAR) 100-12.5 MG tablet Take 1 tablet by mouth daily. 02/12/16   Frederica Kuster, PA-C  oxyCODONE-acetaminophen (PERCOCET/ROXICET) 5-325 MG per tablet Take 1-2 tablets by mouth every 4 (four) hours as needed for moderate pain. Patient not taking: Reported on 02/12/2016 08/31/14   Ulyses Amor, PA-C  oxymetazoline (AFRIN NASAL SPRAY) 0.05 % nasal spray Place 1 spray into both  nostrils 2 (two) times daily. Spray once into each nostril twice daily for up to the next 3 days. Do not use for more than 3 days to prevent rebound rhinorrhea. 05/10/16   Nona Dell, PA-C  traMADol (ULTRAM) 50 MG tablet Take 1 tablet (50 mg total) by mouth every 6 (six) hours as needed. Patient not taking: Reported on 02/12/2016 09/02/14   Elam Dutch, MD    Family History History  reviewed. No pertinent family history.  Social History Social History  Substance Use Topics  . Smoking status: Current Some Day Smoker    Packs/day: 0.25    Years: 17.00    Types: Cigarettes  . Smokeless tobacco: Former Systems developer    Quit date: 01/20/1974  . Alcohol use 0.0 oz/week     Allergies   Aspirin   Review of Systems Review of Systems  Constitutional: Negative for fever.  HENT: Positive for congestion, ear pain, rhinorrhea and sore throat. Negative for sinus pain and sinus pressure.   Respiratory: Positive for cough and wheezing. Negative for shortness of breath.   Cardiovascular: Negative for chest pain.  Gastrointestinal: Negative for abdominal pain and vomiting.  Musculoskeletal: Positive for myalgias.     Physical Exam Updated Vital Signs BP 136/93 (BP Location: Left Arm)   Pulse 95   Temp 97.6 F (36.4 C) (Oral)   Resp 19   SpO2 100%   Physical Exam  Constitutional: He is oriented to person, place, and time. He appears well-developed and well-nourished. No distress.  HENT:  Head: Normocephalic and atraumatic.  Right Ear: A middle ear effusion is present.  Left Ear: A middle ear effusion is present.  Nose: Rhinorrhea present. Right sinus exhibits no maxillary sinus tenderness and no frontal sinus tenderness. Left sinus exhibits no maxillary sinus tenderness and no frontal sinus tenderness.  Mouth/Throat: Uvula is midline, oropharynx is clear and moist and mucous membranes are normal. No oropharyngeal exudate, posterior oropharyngeal edema, posterior oropharyngeal erythema or tonsillar abscesses. No tonsillar exudate.  Eyes: Conjunctivae and EOM are normal. Right eye exhibits no discharge. Left eye exhibits no discharge. No scleral icterus.  Neck: Normal range of motion. Neck supple.  Cardiovascular: Normal rate, regular rhythm, normal heart sounds and intact distal pulses.   Pulmonary/Chest: Effort normal and breath sounds normal. No respiratory distress. He has  no wheezes. He has no rales. He exhibits no tenderness.  Abdominal: Soft. Bowel sounds are normal. He exhibits no distension and no mass. There is no tenderness. There is no rebound and no guarding.  Musculoskeletal: Normal range of motion. He exhibits no edema.  Lymphadenopathy:    He has no cervical adenopathy.  Neurological: He is alert and oriented to person, place, and time.  Skin: Skin is warm and dry. He is not diaphoretic.  Nursing note and vitals reviewed.    ED Treatments / Results  DIAGNOSTIC STUDIES: Oxygen Saturation is 100% on RA, normal by my interpretation.    COORDINATION OF CARE: 11:30 AM Discussed treatment plan with pt at bedside and pt agreed to plan.   Labs (all labs ordered are listed, but only abnormal results are displayed) Labs Reviewed - No data to display  EKG  EKG Interpretation None       Radiology No results found.  Procedures Procedures (including critical care time)  Medications Ordered in ED Medications - No data to display   Initial Impression / Assessment and Plan / ED Course  I have reviewed the triage vital signs and the  nursing notes.  Pertinent labs & imaging results that were available during my care of the patient were reviewed by me and considered in my medical decision making (see chart for details).  Clinical Course     Pt symptoms consistent with URI. Lungs CTAB. VSS. Pt will be discharged with symptomatic treatment.  Discussed return precautions.  Pt is hemodynamically stable & in NAD prior to discharge.   Final Clinical Impressions(s) / ED Diagnoses   Final diagnoses:  Viral URI with cough    New Prescriptions New Prescriptions   BENZONATATE (TESSALON) 100 MG CAPSULE    Take 2 capsules (200 mg total) by mouth 2 (two) times daily as needed for cough.   OXYMETAZOLINE (AFRIN NASAL SPRAY) 0.05 % NASAL SPRAY    Place 1 spray into both nostrils 2 (two) times daily. Spray once into each nostril twice daily for up to  the next 3 days. Do not use for more than 3 days to prevent rebound rhinorrhea.    I personally performed the services described in this documentation, which was scribed in my presence. The recorded information has been reviewed and is accurate.    Chesley Noon [place], [place] 05/10/16 1143    Leo Grosser, MD 05/11/16 [phone]

## 2016-05-10 NOTE — Discharge Instructions (Signed)
Take your medications as prescribed as needed for your cough and nasal congestion. I also recommend drinking fluids at home trimming hydrated. Refrain from smoking prevent exacerbation of her symptoms. He may also take Tylenol and/or ibuprofen as prescribed over-the-counter as needed for body aches. Please follow up with a primary care provider from the Resource Guide provided below in 4-5 days if her symptoms are not improved. Please return to the Emergency Department if symptoms worsen or new onset of fever, headache, neck stiffness, facial swelling, coughing up blood, difficulty breathing, chest pain, vomiting, unable to keep fluids down.

## 2016-07-16 DIAGNOSIS — I739 Peripheral vascular disease, unspecified: Secondary | ICD-10-CM | POA: Insufficient documentation

## 2016-07-18 ENCOUNTER — Telehealth: Payer: Self-pay | Admitting: Cardiology

## 2016-07-18 NOTE — Telephone Encounter (Signed)
Received records from [facility] for appointment on 08/03/16 with Dr Martinique.  Records put with Dr Doug Sou schedule for 08/03/16. lp

## 2016-08-01 NOTE — Progress Notes (Deleted)
Cardiology Office Note    Date:  08/01/2016   ID:  Eric Barnett, DOB 1951/02/18, MRN [id number]  PCP:  No PCP Per Patient  Cardiologist:  Peter Martinique, MD    History of Present Illness:  Eric Barnett is a 66 y.o. male seen at the request of Scott Long PA for evaluation of a heart murmur. He has a history of HTN, HLD, and tobacco abuse. He underwent endovascular stent graft repair for a AAA by Dr. Oneida Alar in April 2016. Prior to that he was seen by Dr. Gwenlyn Found for pre op assessment and had a normal Myoview study. No mention of a heart murmur at that time.    Past Medical History:  Diagnosis Date  . AAA (abdominal aortic aneurysm) ([place])   . Hyperchloremia   . Hyperlipidemia   . Hypertension   . Irregular heartbeat   . Vitamin D deficiency     Past Surgical History:  Procedure Laterality Date  . ABDOMINAL AORTIC ENDOVASCULAR STENT GRAFT N/A 08/30/2014   Procedure: ABDOMINAL AORTIC ENDOVASCULAR STENT GRAFT;  Surgeon: Elam Dutch, MD;  Location: [facility];  Service: Vascular;  Laterality: N/A;  . HERNIA REPAIR    . TONSILLECTOMY      Current Medications: Outpatient Medications Prior to Visit  Medication Sig Dispense Refill  . aspirin 81 MG tablet Take 81 mg by mouth as needed (Pt can take low dose).     Marland Kitchen atorvastatin (LIPITOR) 40 MG tablet Take 1 tablet (40 mg total) by mouth daily. (Patient not taking: Reported on 02/12/2016) 30 tablet 6  . benzonatate (TESSALON) 100 MG capsule Take 2 capsules (200 mg total) by mouth 2 (two) times daily as needed for cough. 20 capsule 0  . dexamethasone (DECADRON) 1 MG tablet Take 1 tablet by mouth once at 11 pm, before coming for labs at 8 am the next morning (Patient not taking: Reported on 02/12/2016) 1 tablet 0  . losartan-hydrochlorothiazide (HYZAAR) 100-12.5 MG tablet Take 1 tablet by mouth daily. 30 tablet 0  . oxyCODONE-acetaminophen (PERCOCET/ROXICET) 5-325 MG per tablet Take 1-2 tablets by mouth every 4 (four) hours as needed for moderate  pain. (Patient not taking: Reported on 02/12/2016) 30 tablet 0  . oxymetazoline (AFRIN NASAL SPRAY) 0.05 % nasal spray Place 1 spray into both nostrils 2 (two) times daily. Spray once into each nostril twice daily for up to the next 3 days. Do not use for more than 3 days to prevent rebound rhinorrhea. 30 mL 0  . traMADol (ULTRAM) 50 MG tablet Take 1 tablet (50 mg total) by mouth every 6 (six) hours as needed. (Patient not taking: Reported on 02/12/2016) 20 tablet 0   No facility-administered medications prior to visit.      Allergies:   Aspirin   Social History   Social History  . Marital status: Single    Spouse name: N/A  . Number of children: N/A  . Years of education: N/A   Social History Main Topics  . Smoking status: Current Some Day Smoker    Packs/day: 0.25    Years: 17.00    Types: Cigarettes  . Smokeless tobacco: Former Systems developer    Quit date: 01/20/1974  . Alcohol use 0.0 oz/week  . Drug use: Yes    Types: Marijuana, Cocaine  . Sexual activity: Not on file   Other Topics Concern  . Not on file   Social History Narrative  . No narrative on file     Family History:  The  patient's ***family history is not on file.   ROS:   Please see the history of present illness.    ROS All other systems reviewed and are negative.   PHYSICAL EXAM:   VS:  There were no vitals taken for this visit.   GEN: Well nourished, well developed, in no acute distress  HEENT: normal  Neck: no JVD, carotid bruits, or masses Cardiac: ***RRR; no murmurs, rubs, or gallops,no edema  Respiratory:  clear to auscultation bilaterally, normal work of breathing GI: soft, nontender, nondistended, + BS MS: no deformity or atrophy  Skin: warm and dry, no rash Neuro:  Alert and Oriented x 3, Strength and sensation are intact Psych: euthymic mood, full affect  Wt Readings from Last 3 Encounters:  02/12/16 170 lb (77.1 kg)  09/21/15 163 lb 3.2 oz (74 kg)  09/30/14 169 lb 6.4 oz (76.8 kg)       Studies/Labs Reviewed:   EKG:  EKG is*** ordered today.  The ekg ordered today demonstrates ***  Recent Labs: 02/12/2016: BUN 18; Creatinine, Ser 1.16; Hemoglobin 14.7; Platelets 206; Potassium 3.7; Sodium 140   Lipid Panel    Component Value Date/Time   CHOL 200 08/17/2014 1422   TRIG 119 08/17/2014 1422   HDL 63 08/17/2014 1422   CHOLHDL 3.2 08/17/2014 1422   VLDL 24 08/17/2014 1422   LDLCALC 113 (H) 08/17/2014 1422    Additional studies/ records that were reviewed today include:    ASSESSMENT:    No diagnosis found.   PLAN:  In order of problems listed above:  1. ***    Medication Adjustments/Labs and Tests Ordered: Current medicines are reviewed at length with the patient today.  Concerns regarding medicines are outlined above.  Medication changes, Labs and Tests ordered today are listed in the Patient Instructions below. There are no Patient Instructions on file for this visit.   Signed, Peter Martinique, MD  08/01/2016 1:40 PM    [facility] [address], [place], [place], [address] [phone]

## 2016-08-03 ENCOUNTER — Ambulatory Visit: Payer: Medicare HMO | Admitting: Cardiology

## 2016-08-03 ENCOUNTER — Telehealth: Payer: Self-pay | Admitting: Cardiology

## 2016-08-03 NOTE — Telephone Encounter (Signed)
08/03/2016 Received faxed referral packet back from nurse saying patient reschedule appointment for 08/27/2016 with Dr. Martinique.  Records given to [facility].  cbr

## 2016-08-17 ENCOUNTER — Encounter: Payer: Self-pay | Admitting: Vascular Surgery

## 2016-08-23 ENCOUNTER — Ambulatory Visit (HOSPITAL_COMMUNITY): Payer: Medicare HMO | Attending: Vascular Surgery

## 2016-08-23 ENCOUNTER — Ambulatory Visit: Payer: Medicare HMO | Admitting: Vascular Surgery

## 2016-08-25 NOTE — Progress Notes (Deleted)
Cardiology Office Note    Date:  08/25/2016   ID:  Eric Barnett, DOB 07-16-1950, MRN [id number]  PCP:  No PCP Per Patient  Cardiologist:  Peter Martinique, MD    History of Present Illness:  Eric Barnett is a 66 y.o. male see at the request of Nicki Reaper Long PA for evaluation of a cardiac murmur. He has a history of HTN, HLD, tobacco abuse, and AAA s/p prior endovascular stent graft repair. He was last evaluated by Dr. Gwenlyn Found in 2016 for an irregular heart beat. An Event monitor was placed and showed PACs, PVCs, and short runs of PACs. A stress Myoview was done at that time and was low risk- inferior/inferolateral thinning/ no ischemia. EF 48%. No murmur mentioned at that time.     Past Medical History:  Diagnosis Date  . AAA (abdominal aortic aneurysm) ([place])   . Hyperchloremia   . Hyperlipidemia   . Hypertension   . Irregular heartbeat   . Vitamin D deficiency     Past Surgical History:  Procedure Laterality Date  . ABDOMINAL AORTIC ENDOVASCULAR STENT GRAFT N/A 08/30/2014   Procedure: ABDOMINAL AORTIC ENDOVASCULAR STENT GRAFT;  Surgeon: Elam Dutch, MD;  Location: [facility];  Service: Vascular;  Laterality: N/A;  . HERNIA REPAIR    . TONSILLECTOMY      Current Medications: Outpatient Medications Prior to Visit  Medication Sig Dispense Refill  . aspirin 81 MG tablet Take 81 mg by mouth as needed (Pt can take low dose).     Marland Kitchen atorvastatin (LIPITOR) 40 MG tablet Take 1 tablet (40 mg total) by mouth daily. (Patient not taking: Reported on 02/12/2016) 30 tablet 6  . benzonatate (TESSALON) 100 MG capsule Take 2 capsules (200 mg total) by mouth 2 (two) times daily as needed for cough. 20 capsule 0  . dexamethasone (DECADRON) 1 MG tablet Take 1 tablet by mouth once at 11 pm, before coming for labs at 8 am the next morning (Patient not taking: Reported on 02/12/2016) 1 tablet 0  . losartan-hydrochlorothiazide (HYZAAR) 100-12.5 MG tablet Take 1 tablet by mouth daily. 30 tablet 0  .  oxyCODONE-acetaminophen (PERCOCET/ROXICET) 5-325 MG per tablet Take 1-2 tablets by mouth every 4 (four) hours as needed for moderate pain. (Patient not taking: Reported on 02/12/2016) 30 tablet 0  . oxymetazoline (AFRIN NASAL SPRAY) 0.05 % nasal spray Place 1 spray into both nostrils 2 (two) times daily. Spray once into each nostril twice daily for up to the next 3 days. Do not use for more than 3 days to prevent rebound rhinorrhea. 30 mL 0  . traMADol (ULTRAM) 50 MG tablet Take 1 tablet (50 mg total) by mouth every 6 (six) hours as needed. (Patient not taking: Reported on 02/12/2016) 20 tablet 0   No facility-administered medications prior to visit.      Allergies:   Aspirin   Social History   Social History  . Marital status: Single    Spouse name: N/A  . Number of children: N/A  . Years of education: N/A   Social History Main Topics  . Smoking status: Current Some Day Smoker    Packs/day: 0.25    Years: 17.00    Types: Cigarettes  . Smokeless tobacco: Former Systems developer    Quit date: 01/20/1974  . Alcohol use 0.0 oz/week  . Drug use: Yes    Types: Marijuana, Cocaine  . Sexual activity: Not on file   Other Topics Concern  . Not on file  Social History Narrative  . No narrative on file     Family History:  The patient's ***family history is not on file.   ROS:   Please see the history of present illness.    ROS All other systems reviewed and are negative.   PHYSICAL EXAM:   VS:  There were no vitals taken for this visit.   GEN: Well nourished, well developed, in no acute distress  HEENT: normal  Neck: no JVD, carotid bruits, or masses Cardiac: ***RRR; no murmurs, rubs, or gallops,no edema  Respiratory:  clear to auscultation bilaterally, normal work of breathing GI: soft, nontender, nondistended, + BS MS: no deformity or atrophy  Skin: warm and dry, no rash Neuro:  Alert and Oriented x 3, Strength and sensation are intact Psych: euthymic mood, full affect  Wt Readings  from Last 3 Encounters:  02/12/16 170 lb (77.1 kg)  09/21/15 163 lb 3.2 oz (74 kg)  09/30/14 169 lb 6.4 oz (76.8 kg)      Studies/Labs Reviewed:   EKG:  EKG is*** ordered today.  The ekg ordered today demonstrates ***  Recent Labs: 02/12/2016: BUN 18; Creatinine, Ser 1.16; Hemoglobin 14.7; Platelets 206; Potassium 3.7; Sodium 140   Lipid Panel    Component Value Date/Time   CHOL 200 08/17/2014 1422   TRIG 119 08/17/2014 1422   HDL 63 08/17/2014 1422   CHOLHDL 3.2 08/17/2014 1422   VLDL 24 08/17/2014 1422   LDLCALC 113 (H) 08/17/2014 1422    Additional studies/ records that were reviewed today include:    ASSESSMENT:    No diagnosis found.   PLAN:  In order of problems listed above:  1. ***    Medication Adjustments/Labs and Tests Ordered: Current medicines are reviewed at length with the patient today.  Concerns regarding medicines are outlined above.  Medication changes, Labs and Tests ordered today are listed in the Patient Instructions below. There are no Patient Instructions on file for this visit.   Signed, Peter Martinique, MD  08/25/2016 4:16 PM    [facility] [address] [place], [place], [address] [phone]

## 2016-08-27 ENCOUNTER — Ambulatory Visit: Payer: Medicare HMO | Admitting: Cardiology

## 2016-09-03 ENCOUNTER — Encounter: Payer: Self-pay | Admitting: Cardiology

## 2016-09-22 NOTE — Progress Notes (Deleted)
Cardiology Office Note    Date:  09/22/2016   ID:  Eric Barnett, DOB January 06, 1951, MRN [id number]  PCP:  No PCP Per Patient  Cardiologist:  Cassandre Oleksy Martinique, MD    History of Present Illness:  Eric Barnett is a 66 y.o. male referred by Eric Barnett for evaluation of a systolic murmur. He has a history of HTN and HLD. He was seen by Dr. Gwenlyn Barnett in 2015 for evaluation of an irregular heart beat. An Event monitor was done showing PACs, PVCs, and a 10 beat run PAT. A myoview study was done in March 2016 and was low risk. He is s/p endovascular stent graft repair of a AAA in April 2016 by Dr. [place] Barnett. He has not had follow up since then.    Past Medical History:  Diagnosis Date  . AAA (abdominal aortic aneurysm) ([place])   . Hyperchloremia   . Hyperlipidemia   . Hypertension   . Irregular heartbeat   . Vitamin D deficiency     Past Surgical History:  Procedure Laterality Date  . ABDOMINAL AORTIC ENDOVASCULAR STENT GRAFT N/A 08/30/2014   Procedure: ABDOMINAL AORTIC ENDOVASCULAR STENT GRAFT;  Surgeon: Elam Dutch, MD;  Location: [facility];  Service: Vascular;  Laterality: N/A;  . HERNIA REPAIR    . TONSILLECTOMY      Current Medications: Outpatient Medications Prior to Visit  Medication Sig Dispense Refill  . aspirin 81 MG tablet Take 81 mg by mouth as needed (Pt can take low dose).     Marland Kitchen atorvastatin (LIPITOR) 40 MG tablet Take 1 tablet (40 mg total) by mouth daily. (Patient not taking: Reported on 02/12/2016) 30 tablet 6  . benzonatate (TESSALON) 100 MG capsule Take 2 capsules (200 mg total) by mouth 2 (two) times daily as needed for cough. 20 capsule 0  . dexamethasone (DECADRON) 1 MG tablet Take 1 tablet by mouth once at 11 pm, before coming for labs at 8 am the next morning (Patient not taking: Reported on 02/12/2016) 1 tablet 0  . losartan-hydrochlorothiazide (HYZAAR) 100-12.5 MG tablet Take 1 tablet by mouth daily. 30 tablet 0  . oxyCODONE-acetaminophen (PERCOCET/ROXICET) 5-325 MG per  tablet Take 1-2 tablets by mouth every 4 (four) hours as needed for moderate pain. (Patient not taking: Reported on 02/12/2016) 30 tablet 0  . oxymetazoline (AFRIN NASAL SPRAY) 0.05 % nasal spray Place 1 spray into both nostrils 2 (two) times daily. Spray once into each nostril twice daily for up to the next 3 days. Do not use for more than 3 days to prevent rebound rhinorrhea. 30 mL 0  . traMADol (ULTRAM) 50 MG tablet Take 1 tablet (50 mg total) by mouth every 6 (six) hours as needed. (Patient not taking: Reported on 02/12/2016) 20 tablet 0   No facility-administered medications prior to visit.      Allergies:   Aspirin   Social History   Social History  . Marital status: Single    Spouse name: N/A  . Number of children: N/A  . Years of education: N/A   Social History Main Topics  . Smoking status: Current Some Day Smoker    Packs/day: 0.25    Years: 17.00    Types: Cigarettes  . Smokeless tobacco: Former Systems developer    Quit date: 01/20/1974  . Alcohol use 0.0 oz/week  . Drug use: Yes    Types: Marijuana, Cocaine  . Sexual activity: Not on file   Other Topics Concern  . Not on file  Social History Narrative  . No narrative on file     Family History:  The patient's ***family history is not on file.   ROS:   Please see the history of present illness.    ROS All other systems reviewed and are negative.   PHYSICAL EXAM:   VS:  There were no vitals taken for this visit.   GEN: Well nourished, well developed, in no acute distress  HEENT: normal  Neck: no JVD, carotid bruits, or masses Cardiac: ***RRR; no murmurs, rubs, or gallops,no edema  Respiratory:  clear to auscultation bilaterally, normal work of breathing GI: soft, nontender, nondistended, + BS MS: no deformity or atrophy  Skin: warm and dry, no rash Neuro:  Alert and Oriented x 3, Strength and sensation are intact Psych: euthymic mood, full affect  Wt Readings from Last 3 Encounters:  02/12/16 170 lb (77.1 kg)    09/21/15 163 lb 3.2 oz (74 kg)  09/30/14 169 lb 6.4 oz (76.8 kg)      Studies/Labs Reviewed:   EKG:  EKG is*** ordered today.  The ekg ordered today demonstrates ***  Recent Labs: 02/12/2016: BUN 18; Creatinine, Ser 1.16; Hemoglobin 14.7; Platelets 206; Potassium 3.7; Sodium 140   Lipid Panel    Component Value Date/Time   CHOL 200 08/17/2014 1422   TRIG 119 08/17/2014 1422   HDL 63 08/17/2014 1422   CHOLHDL 3.2 08/17/2014 1422   VLDL 24 08/17/2014 1422   LDLCALC 113 (H) 08/17/2014 1422    Additional studies/ records that were reviewed today include:    ASSESSMENT:    No diagnosis Barnett.   PLAN:  In order of problems listed above:  1. ***    Medication Adjustments/Labs and Tests Ordered: Current medicines are reviewed at length with the patient today.  Concerns regarding medicines are outlined above.  Medication changes, Labs and Tests ordered today are listed in the Patient Instructions below. There are no Patient Instructions on file for this visit.   Signed, Eric Jessop Martinique, MD  09/22/2016 3:18 PM    Eric Barnett [address] [place], [place], [address] [phone]

## 2016-09-25 ENCOUNTER — Ambulatory Visit: Payer: Medicare HMO | Admitting: Cardiology

## 2016-09-25 ENCOUNTER — Encounter: Payer: Self-pay | Admitting: *Deleted

## 2016-10-12 ENCOUNTER — Encounter: Payer: Self-pay | Admitting: Vascular Surgery

## 2016-10-25 ENCOUNTER — Encounter: Payer: Self-pay | Admitting: Vascular Surgery

## 2016-10-25 ENCOUNTER — Ambulatory Visit (HOSPITAL_COMMUNITY)
Admission: RE | Admit: 2016-10-25 | Discharge: 2016-10-25 | Disposition: A | Discharge status: Home / Self Care | Payer: Medicare HMO | Source: Ambulatory Visit | Attending: Vascular Surgery | Admitting: Vascular Surgery

## 2016-10-25 ENCOUNTER — Ambulatory Visit (INDEPENDENT_AMBULATORY_CARE_PROVIDER_SITE_OTHER): Payer: Medicare HMO | Admitting: Vascular Surgery

## 2016-10-25 VITALS — BP 137/95 | HR 74 | Temp 97.6°F | Resp 20 | Ht 75.0 in | Wt 176.0 lb

## 2016-10-25 DIAGNOSIS — I739 Peripheral vascular disease, unspecified: Secondary | ICD-10-CM

## 2016-10-25 DIAGNOSIS — I714 Abdominal aortic aneurysm, without rupture, unspecified: Secondary | ICD-10-CM

## 2016-10-25 DIAGNOSIS — I70201 Unspecified atherosclerosis of native arteries of extremities, right leg: Secondary | ICD-10-CM | POA: Diagnosis not present

## 2016-10-25 NOTE — Progress Notes (Signed)
Referring Physician: Shanon Rosser PA Patient name: Eric Barnett MRN: [id number] DOB: September 21, 1950 Sex: male  REASON FOR CONSULT: Right leg claudication  HPI: Eric Barnett is a 66 y.o. male, who complains of pain in his right calf after walking about 2 blocks. He states this is been going on for 2-3 years. He states that recently it has improved some but still limits his ability to walk long distances. He denies any symptoms in the left leg. He is known to our practice from previous endovascular stent graft repair of abdominal aortic aneurysm April 2016. Unfortunately he was lost to follow-up. He denies any abdominal or back pain. He currently smokes about a half a pack cigarettes per day. He was counseled against this today. Other medical problems include hypertension hyperlipidemia both of which have been stable. He denies rest pain or nonhealing wounds.  Past Medical History:  Diagnosis Date  . AAA (abdominal aortic aneurysm) ([facility])   . Hyperchloremia   . Hyperlipidemia   . Hypertension   . Irregular heartbeat   . Vitamin D deficiency    Past Surgical History:  Procedure Laterality Date  . ABDOMINAL AORTIC ENDOVASCULAR STENT GRAFT N/A 08/30/2014   Procedure: ABDOMINAL AORTIC ENDOVASCULAR STENT GRAFT;  Surgeon: Elam Dutch, MD;  Location: [facility];  Service: Vascular;  Laterality: N/A;  . HERNIA REPAIR    . TONSILLECTOMY      History reviewed. No pertinent family history.  SOCIAL HISTORY: Social History   Social History  . Marital status: Single    Spouse name: N/A  . Number of children: N/A  . Years of education: N/A   Occupational History  . Not on file.   Social History Main Topics  . Smoking status: Current Some Day Smoker    Packs/day: 0.25    Years: 17.00    Types: Cigarettes  . Smokeless tobacco: Former Systems developer    Quit date: 01/20/1974  . Alcohol use 0.0 oz/week  . Drug use: Yes    Types: Marijuana, Cocaine  . Sexual activity: Not on file   Other Topics Concern    . Not on file   Social History Narrative  . No narrative on file    Allergies  Allergen Reactions  . Aspirin Nausea Only    REACTION :HIGH DOSE  Upset patients   stomach-     Current Outpatient Prescriptions  Medication Sig Dispense Refill  . losartan-hydrochlorothiazide (HYZAAR) 100-12.5 MG tablet Take 1 tablet by mouth daily. 30 tablet 0  . aspirin 81 MG tablet Take 81 mg by mouth as needed (Pt can take low dose).     Marland Kitchen atorvastatin (LIPITOR) 40 MG tablet Take 1 tablet (40 mg total) by mouth daily. (Patient not taking: Reported on 02/12/2016) 30 tablet 6  . benzonatate (TESSALON) 100 MG capsule Take 2 capsules (200 mg total) by mouth 2 (two) times daily as needed for cough. (Patient not taking: Reported on 10/25/2016) 20 capsule 0  . dexamethasone (DECADRON) 1 MG tablet Take 1 tablet by mouth once at 11 pm, before coming for labs at 8 am the next morning (Patient not taking: Reported on 02/12/2016) 1 tablet 0  . oxyCODONE-acetaminophen (PERCOCET/ROXICET) 5-325 MG per tablet Take 1-2 tablets by mouth every 4 (four) hours as needed for moderate pain. (Patient not taking: Reported on 02/12/2016) 30 tablet 0  . oxymetazoline (AFRIN NASAL SPRAY) 0.05 % nasal spray Place 1 spray into both nostrils 2 (two) times daily. Spray once into each nostril twice daily  for up to the next 3 days. Do not use for more than 3 days to prevent rebound rhinorrhea. (Patient not taking: Reported on 10/25/2016) 30 mL 0  . traMADol (ULTRAM) 50 MG tablet Take 1 tablet (50 mg total) by mouth every 6 (six) hours as needed. (Patient not taking: Reported on 02/12/2016) 20 tablet 0   No current facility-administered medications for this visit.     ROS:   General:  No weight loss, Fever, chills  HEENT: No recent headaches, no nasal bleeding, no visual changes, no sore throat  Neurologic: No dizziness, blackouts, seizures. No recent symptoms of stroke or mini- stroke. No recent episodes of slurred speech, or temporary  blindness.  Cardiac: No recent episodes of chest pain/pressure, no shortness of breath at rest.  No shortness of breath with exertion.  Denies history of atrial fibrillation or irregular heartbeat  Vascular: No history of rest pain in feet.  + history of claudication.  No history of non-healing ulcer, No history of DVT   Pulmonary: No home oxygen, no productive cough, no hemoptysis,  No asthma or wheezing  Musculoskeletal:  [ ]  Arthritis, [ ]  Low back pain,  [ ]  Joint pain  Hematologic:No history of hypercoagulable state.  No history of easy bleeding.  No history of anemia  Gastrointestinal: No hematochezia or melena,  No gastroesophageal reflux, no trouble swallowing  Urinary: [ ]  chronic Kidney disease, [ ]  on HD - [ ]  MWF or [ ]  TTHS, [ ]  Burning with urination, [ ]  Frequent urination, [ ]  Difficulty urinating;   Skin: No rashes  Psychological: No history of anxiety,  No history of depression   Physical Examination  Vitals:   10/25/16 1449  BP: (!) 137/95  Pulse: 74  Resp: 20  Temp: 97.6 F (36.4 C)  TempSrc: Oral  SpO2: 97%  Weight: 176 lb (79.8 kg)  Height: 6\' 3"  (1.905 m)    Body mass index is 22 kg/m.  General:  Alert and oriented, no acute distress HEENT: Normal Neck: No bruit or JVD Pulmonary: Clear to auscultation bilaterally Cardiac: Regular Rate and Rhythm without murmur Abdomen: Soft, non-tender, non-distended, no mass, no scars Skin: No rash Extremity Pulses:  2+ radial, brachial, femoral, Absent popliteal dorsalis pedis, posterior tibial pulses bilaterally Musculoskeletal: No deformity or edema  Neurologic: Upper and lower extremity motor 5/5 and symmetric  DATA:  Patient had bilateral ABIs performed today which were 0.87 on the right 0.96 on the left monophasic to biphasic waveforms  ASSESSMENT:  Patient with mild lower extremity claudication symptoms. His ABIs and equate adequate perfusion that do not put him at risk for limb loss currently. I  discussed with the patient today the options of a walking program of 30 minutes daily and smoking cessation which usually improves patient symptoms 75% of the time. The other option would be to proceed with aortogram lower extremity runoff for possible superficial femoral artery occlusive disease. The patient currently does not feel that limited by his symptoms. He wishes to try and exercise program first. He will follow-up with [place] in 6 months time for a duplex of his aortic aneurysm stent graft as well as repeat ABIs. He will see our nurse practitioner at that visit. If he decides to proceed with an arteriogram sooner he can call us at any time.   PLAN:  See above   Ruta Hinds, MD [facility] Office: [phone] Pager: [phone]

## 2016-10-26 ENCOUNTER — Encounter: Payer: Self-pay | Admitting: Cardiology

## 2016-12-21 ENCOUNTER — Encounter (HOSPITAL_COMMUNITY): Payer: Self-pay | Admitting: Emergency Medicine

## 2016-12-21 ENCOUNTER — Emergency Department (HOSPITAL_COMMUNITY)
Admission: EM | Admit: 2016-12-21 | Discharge: 2016-12-21 | Disposition: A | Discharge status: Home / Self Care | Payer: Medicare HMO | Attending: Emergency Medicine | Admitting: Emergency Medicine

## 2016-12-21 DIAGNOSIS — Z79899 Other long term (current) drug therapy: Secondary | ICD-10-CM | POA: Insufficient documentation

## 2016-12-21 DIAGNOSIS — I10 Essential (primary) hypertension: Secondary | ICD-10-CM | POA: Diagnosis not present

## 2016-12-21 DIAGNOSIS — F17210 Nicotine dependence, cigarettes, uncomplicated: Secondary | ICD-10-CM | POA: Diagnosis not present

## 2016-12-21 DIAGNOSIS — F1410 Cocaine abuse, uncomplicated: Secondary | ICD-10-CM

## 2016-12-21 DIAGNOSIS — Z7982 Long term (current) use of aspirin: Secondary | ICD-10-CM | POA: Insufficient documentation

## 2016-12-21 LAB — COMPREHENSIVE METABOLIC PANEL
ALT: 20 U/L (ref 17–63)
ANION GAP: 11 (ref 5–15)
AST: 38 U/L (ref 15–41)
Albumin: 4.7 g/dL (ref 3.5–5.0)
Alkaline Phosphatase: 68 U/L (ref 38–126)
BUN: 20 mg/dL (ref 6–20)
CHLORIDE: 104 mmol/L (ref 101–111)
CO2: 26 mmol/L (ref 22–32)
Calcium: 10.4 mg/dL — ABNORMAL HIGH (ref 8.9–10.3)
Creatinine, Ser: 1.67 mg/dL — ABNORMAL HIGH (ref 0.61–1.24)
GFR calc Af Amer: 48 mL/min — ABNORMAL LOW (ref >60)
GFR calc non Af Amer: 41 mL/min — ABNORMAL LOW (ref >60)
Glucose, Bld: 114 mg/dL — ABNORMAL HIGH (ref 65–99)
Potassium: 3.5 mmol/L (ref 3.5–5.1)
SODIUM: 141 mmol/L (ref 135–145)
Total Bilirubin: 1.1 mg/dL (ref 0.3–1.2)
Total Protein: 7.5 g/dL (ref 6.5–8.1)

## 2016-12-21 LAB — CBC
HEMATOCRIT: 40.6 % (ref 39.0–52.0)
Hemoglobin: 13.9 g/dL (ref 13.0–17.0)
MCH: 31.7 pg (ref 26.0–34.0)
MCHC: 34.2 g/dL (ref 30.0–36.0)
MCV: 92.5 fL (ref 78.0–100.0)
Platelets: 192 10*3/uL (ref 150–400)
RBC: 4.39 MIL/uL (ref 4.22–5.81)
RDW: 13.6 % (ref 11.5–15.5)
WBC: 8.6 10*3/uL (ref 4.0–10.5)

## 2016-12-21 LAB — RAPID URINE DRUG SCREEN, HOSP PERFORMED
AMPHETAMINES: NOT DETECTED
BENZODIAZEPINES: NOT DETECTED
Barbiturates: NOT DETECTED
COCAINE: POSITIVE — AB
Opiates: NOT DETECTED
Tetrahydrocannabinol: NOT DETECTED

## 2016-12-21 LAB — ETHANOL: Alcohol, Ethyl (B): <5 mg/dL (ref <5)

## 2016-12-21 NOTE — ED Notes (Signed)
Pt. Is in scrubs, placed in the room. Blanket given

## 2016-12-21 NOTE — ED Triage Notes (Signed)
Pt admits to ED for drug/acohol detox. Not participating in triage interview so unable to gather much information. Uses crack cocaine and alcohol, unknown how often or how much. Unknown how much he drinks on a daily basis. Says last drink yesterday. Changed into burgundy scrubs

## 2016-12-21 NOTE — ED Notes (Signed)
Pt. Denies any suicidal thoughts or ideas.  Pt. Is very calm and cooperative

## 2016-12-21 NOTE — ED Notes (Signed)
Pt belongings placed in F Pod in locker room. Belongs placed in cabinet with #10 on the door and locked. Belongings was not inventoried in triage which is were pt is at time of note.

## 2016-12-21 NOTE — ED Notes (Signed)
Pt.'s belongings given back to pt.  Spoke with pt. About drug and alcohol resources.    Pt. Very calm and cooperative.  Pt.; very receptive to the information.

## 2016-12-21 NOTE — ED Provider Notes (Signed)
[facility] DEPT Provider Note   CSN: [id number] Arrival date & time: 12/21/16  6010     History   Chief Complaint Chief Complaint  Patient presents with  . Drug / Alcohol Assessment    HPI Eric Barnett is a 66 y.o. male.  Pt presents to the ED today with a complaint of crack abuse.  He said he's been using and wants to stop.  The pt denies using alcohol to me, but told the triage nurse he has been drinking.  Last cocaine use yesterday.  He told triage nurse last drink yesterday.  The pt denies SI/HI.  He denies any pain or other specific complaints.        Past Medical History:  Diagnosis Date  . AAA (abdominal aortic aneurysm) ([facility])   . Hyperchloremia   . Hyperlipidemia   . Hypertension   . Irregular heartbeat   . Vitamin D deficiency     Patient Active Problem List   Diagnosis Date Noted  . Adrenal mass, left ([place]) 09/30/2014  . Adrenal mass, right ([facility]) 09/30/2014  . AAA (abdominal aortic aneurysm) ([facility]) 08/30/2014  . 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 AAA (abdominal aortic aneurysm) without rupture ([facility]) 01/20/2014  . Abdominal aortic aneurysm ([facility]) 01/15/2014  . Irregular heart rate 01/15/2014  . OBESITY 06/02/2010  . TOBACCO ABUSE 06/02/2010  . URINALYSIS, ABNORMAL 12/15/2009  . PARESTHESIA 02/15/2009  . DEGENERATIVE JOINT DISEASE, HIPS 09/27/2008  . DERMATOPHYTOSIS, NAIL 05/16/2007  . COCAINE ABUSE, HX OF 01/21/2007  . INGUINAL HERNIORRHAPHIES, BILATERAL, HX OF 01/21/2007  . DYSLIPIDEMIA 01/18/2007  . Essential hypertension 01/18/2007  . PERIODONTAL DISEASE 01/18/2007  . INGUINAL HERNIA, HX OF 01/18/2007    Past Surgical History:  Procedure Laterality Date  . ABDOMINAL AORTIC ENDOVASCULAR STENT GRAFT N/A 08/30/2014   Procedure: ABDOMINAL AORTIC ENDOVASCULAR STENT GRAFT;  Surgeon: Elam Dutch, MD;  Location: [facility];  Service: Vascular;  Laterality: N/A;  . HERNIA REPAIR    . TONSILLECTOMY         Home Medications    Prior to  Admission medications   Medication Sig Start Date End Date Taking? Authorizing Provider  aspirin 81 MG tablet Take 81 mg by mouth as needed (Pt can take low dose).     [provider]  atorvastatin (LIPITOR) 40 MG tablet Take 1 tablet (40 mg total) by mouth daily. Patient not taking: Reported on 02/12/2016 08/24/14   Lorretta Harp, MD  benzonatate (TESSALON) 100 MG capsule Take 2 capsules (200 mg total) by mouth 2 (two) times daily as needed for cough. Patient not taking: Reported on 10/25/2016 05/10/16   Nona Dell, PA-C  dexamethasone (DECADRON) 1 MG tablet Take 1 tablet by mouth once at 11 pm, before coming for labs at 8 am the next morning Patient not taking: Reported on 02/12/2016 08/18/14   Philemon Kingdom, MD  losartan-hydrochlorothiazide (HYZAAR) 100-12.5 MG tablet Take 1 tablet by mouth daily. 02/12/16   Law, Bea Graff, PA-C  oxyCODONE-acetaminophen (PERCOCET/ROXICET) 5-325 MG per tablet Take 1-2 tablets by mouth every 4 (four) hours as needed for moderate pain. Patient not taking: Reported on 02/12/2016 08/31/14   Ulyses Amor, PA-C  oxymetazoline (AFRIN NASAL SPRAY) 0.05 % nasal spray Place 1 spray into both nostrils 2 (two) times daily. Spray once into each nostril twice daily for up to the next 3 days. Do not use for more than 3 days to prevent rebound rhinorrhea. Patient not taking: Reported on 10/25/2016 05/10/16   Modena Jansky,  Chesley Noon, PA-C  traMADol (ULTRAM) 50 MG tablet Take 1 tablet (50 mg total) by mouth every 6 (six) hours as needed. Patient not taking: Reported on 02/12/2016 09/02/14   Elam Dutch, MD    Family History No family history on file.  Social History Social History  Substance Use Topics  . Smoking status: Current Some Day Smoker    Packs/day: 0.25    Years: 17.00    Types: Cigarettes  . Smokeless tobacco: Former Systems developer    Quit date: 01/20/1974  . Alcohol use 0.0 oz/week     Allergies   Aspirin   Review of  Systems Review of Systems  All other systems reviewed and are negative.    Physical Exam Updated Vital Signs BP 114/82 (BP Location: Left Arm)   Pulse 95   Temp 97.6 F (36.4 C) (Oral)   Resp 18   SpO2 99%   Physical Exam  Constitutional: He is oriented to person, place, and time. He appears well-developed and well-nourished.  HENT:  Head: Normocephalic and atraumatic.  Right Ear: External ear normal.  Left Ear: External ear normal.  Nose: Nose normal.  Mouth/Throat: Oropharynx is clear and moist.  Eyes: Pupils are equal, round, and reactive to light. Conjunctivae and EOM are normal.  Neck: Normal range of motion. Neck supple.  Cardiovascular: Normal rate, regular rhythm, normal heart sounds and intact distal pulses.   Pulmonary/Chest: Effort normal and breath sounds normal.  Abdominal: Soft. Bowel sounds are normal.  Musculoskeletal: Normal range of motion.  Neurological: He is alert and oriented to person, place, and time.  Skin: Skin is warm.  Psychiatric: He has a normal mood and affect. His behavior is normal. Judgment and thought content normal.  Nursing note and vitals reviewed.    ED Treatments / Results  Labs (all labs ordered are listed, but only abnormal results are displayed) Labs Reviewed  COMPREHENSIVE METABOLIC PANEL - Abnormal; Notable for the following:       Result Value   Glucose, Bld 114 (*)    Creatinine, Ser 1.67 (*)    Calcium 10.4 (*)    GFR calc non Af Amer 41 (*)    GFR calc Af Amer 48 (*)    All other components within normal limits  RAPID URINE DRUG SCREEN, HOSP PERFORMED - Abnormal; Notable for the following:    Cocaine POSITIVE (*)    All other components within normal limits  ETHANOL  CBC    EKG  EKG Interpretation None       Radiology No results found.  Procedures Procedures (including critical care time)  Medications Ordered in ED Medications - No data to display   Initial Impression / Assessment and Plan / ED  Course  I have reviewed the triage vital signs and the nursing notes.  Pertinent labs & imaging results that were available during my care of the patient were reviewed by me and considered in my medical decision making (see chart for details).    Pt has no acute problem today.  He does require treatment for his crack use and it is good that he recognizes this, but this is not done as an inpatient.  The pt is stable for d/c.  He will be given a resource guide for outpatient treatment.  Final Clinical Impressions(s) / ED Diagnoses   Final diagnoses:  Cocaine abuse    New Prescriptions New Prescriptions   No medications on file     Isla Pence, MD 12/21/16 [phone]

## 2016-12-29 ENCOUNTER — Encounter (HOSPITAL_COMMUNITY): Payer: Self-pay

## 2016-12-29 ENCOUNTER — Emergency Department (HOSPITAL_COMMUNITY)
Admission: EM | Admit: 2016-12-29 | Discharge: 2016-12-29 | Disposition: A | Discharge status: Home / Self Care | Payer: Medicare HMO | Attending: Emergency Medicine | Admitting: Emergency Medicine

## 2016-12-29 DIAGNOSIS — N3091 Cystitis, unspecified with hematuria: Secondary | ICD-10-CM | POA: Insufficient documentation

## 2016-12-29 DIAGNOSIS — N3090 Cystitis, unspecified without hematuria: Secondary | ICD-10-CM

## 2016-12-29 DIAGNOSIS — R319 Hematuria, unspecified: Secondary | ICD-10-CM | POA: Diagnosis present

## 2016-12-29 DIAGNOSIS — I10 Essential (primary) hypertension: Secondary | ICD-10-CM | POA: Insufficient documentation

## 2016-12-29 DIAGNOSIS — Z79899 Other long term (current) drug therapy: Secondary | ICD-10-CM | POA: Insufficient documentation

## 2016-12-29 DIAGNOSIS — F17210 Nicotine dependence, cigarettes, uncomplicated: Secondary | ICD-10-CM | POA: Diagnosis not present

## 2016-12-29 LAB — URINALYSIS, MICROSCOPIC (REFLEX)

## 2016-12-29 LAB — URINALYSIS, ROUTINE W REFLEX MICROSCOPIC
Glucose, UA: NEGATIVE mg/dL
KETONES UR: 15 mg/dL — AB
NITRITE: POSITIVE — AB
PH: 7 (ref 5.0–8.0)
Protein, ur: 100 mg/dL — AB
Specific Gravity, Urine: 1.01 (ref 1.005–1.030)

## 2016-12-29 MED ORDER — CIPROFLOXACIN HCL 500 MG PO TABS: 500.0000 mg | ORAL_TABLET | Freq: Two times a day (BID) | ORAL | 0 refills | Status: DC

## 2016-12-29 MED ORDER — CIPROFLOXACIN HCL 500 MG PO TABS
500.0000 mg | ORAL_TABLET | Freq: Once | ORAL | Status: AC
  Administered 2016-12-29: 500 mg via ORAL
  Filled 2016-12-29: qty 1

## 2016-12-29 NOTE — ED Triage Notes (Signed)
Patient complains of 2 days of hematuria, no pain, NAD

## 2016-12-29 NOTE — Discharge Instructions (Signed)
As discussed, it is important that you follow-up with your nurse practitioner within the week to ensure appropriate resolution of her symptoms. You should also discuss referral to a urologist as needed. Return here for concerning changes in your condition.

## 2016-12-29 NOTE — ED Provider Notes (Signed)
[facility] DEPT Provider Note   CSN: [id number] Arrival date & time: 12/29/16  1138     History   Chief Complaint Chief Complaint  Patient presents with  . Hematuria    HPI Eric Barnett is a 66 y.o. male.  HPI  Patient presents with concern hematuria. Onset was 3 days ago.  Since onset symptoms of been persistent, though with less profound darkening of his urine. No associated complaints including no abdominal pain, back pain, lightheadedness, fever, chills, dysuria. No history of similar prior events, no history of kidney stone, kidney infection, prostate issues. He does have a history of abdominal aortic aneurysm repair, denies any abdominal pain, weakness in any extremity, skin color changes.  Past Medical History:  Diagnosis Date  . AAA (abdominal aortic aneurysm) ([place])   . Hyperchloremia   . Hyperlipidemia   . Hypertension   . Irregular heartbeat   . Vitamin D deficiency     Patient Active Problem List   Diagnosis Date Noted  . Adrenal mass, left ([place]) 09/30/2014  . Adrenal mass, right ([place]) 09/30/2014  . AAA (abdominal aortic aneurysm) ([place]) 08/30/2014  . 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 AAA (abdominal aortic aneurysm) without rupture ([place]) 01/20/2014  . Abdominal aortic aneurysm ([place]) 01/15/2014  . Irregular heart rate 01/15/2014  . OBESITY 06/02/2010  . TOBACCO ABUSE 06/02/2010  . URINALYSIS, ABNORMAL 12/15/2009  . PARESTHESIA 02/15/2009  . DEGENERATIVE JOINT DISEASE, HIPS 09/27/2008  . DERMATOPHYTOSIS, NAIL 05/16/2007  . COCAINE ABUSE, HX OF 01/21/2007  . INGUINAL HERNIORRHAPHIES, BILATERAL, HX OF 01/21/2007  . DYSLIPIDEMIA 01/18/2007  . Essential hypertension 01/18/2007  . PERIODONTAL DISEASE 01/18/2007  . INGUINAL HERNIA, HX OF 01/18/2007    Past Surgical History:  Procedure Laterality Date  . ABDOMINAL AORTIC ENDOVASCULAR STENT GRAFT N/A 08/30/2014   Procedure: ABDOMINAL AORTIC ENDOVASCULAR STENT GRAFT;  Surgeon: Elam Dutch, MD;   Location: [facility];  Service: Vascular;  Laterality: N/A;  . HERNIA REPAIR    . TONSILLECTOMY         Home Medications    Prior to Admission medications   Medication Sig Start Date End Date Taking? Authorizing Provider  atorvastatin (LIPITOR) 40 MG tablet Take 1 tablet (40 mg total) by mouth daily. 08/24/14  Yes Lorretta Harp, MD  losartan-hydrochlorothiazide (HYZAAR) 100-25 MG tablet Take 1 tablet by mouth every evening.   Yes [provider]  benzonatate (TESSALON) 100 MG capsule Take 2 capsules (200 mg total) by mouth 2 (two) times daily as needed for cough. Patient not taking: Reported on 10/25/2016 05/10/16   Nona Dell, PA-C  ciprofloxacin (CIPRO) 500 MG tablet Take 1 tablet (500 mg total) by mouth 2 (two) times daily. 12/29/16   Carmin Muskrat, MD  losartan-hydrochlorothiazide (HYZAAR) 100-12.5 MG tablet Take 1 tablet by mouth daily. Patient not taking: Reported on 12/29/2016 02/12/16   Frederica Kuster, PA-C  oxyCODONE-acetaminophen (PERCOCET/ROXICET) 5-325 MG per tablet Take 1-2 tablets by mouth every 4 (four) hours as needed for moderate pain. Patient not taking: Reported on 02/12/2016 08/31/14   Ulyses Amor, PA-C  oxymetazoline (AFRIN NASAL SPRAY) 0.05 % nasal spray Place 1 spray into both nostrils 2 (two) times daily. Spray once into each nostril twice daily for up to the next 3 days. Do not use for more than 3 days to prevent rebound rhinorrhea. Patient not taking: Reported on 10/25/2016 05/10/16   Nona Dell, PA-C  traMADol (ULTRAM) 50 MG tablet Take 1 tablet (50 mg total) by mouth  every 6 (six) hours as needed. Patient not taking: Reported on 02/12/2016 09/02/14   Elam Dutch, MD    Family History No family history on file.  Social History Social History  Substance Use Topics  . Smoking status: Current Some Day Smoker    Packs/day: 0.25    Years: 17.00    Types: Cigarettes  . Smokeless tobacco: Former Systems developer    Quit date:  01/20/1974  . Alcohol use 0.0 oz/week     Allergies   Aspirin   Review of Systems Review of Systems  Constitutional:       Per HPI, otherwise negative  HENT:       Per HPI, otherwise negative  Respiratory:       Per HPI, otherwise negative  Cardiovascular:       Per HPI, otherwise negative  Gastrointestinal: Negative for vomiting.  Endocrine:       Negative aside from HPI  Genitourinary:       Neg aside from HPI   Musculoskeletal:       Per HPI, otherwise negative  Skin: Negative.   Neurological: Negative for syncope.     Physical Exam Updated Vital Signs BP 118/85   Pulse 90   Temp 98.1 F (36.7 C) (Oral)   Resp 16   SpO2 99%   Physical Exam  Constitutional: He is oriented to person, place, and time. He appears well-developed. No distress.  HENT:  Head: Normocephalic and atraumatic.  Eyes: Conjunctivae and EOM are normal.  Cardiovascular: Normal rate, regular rhythm and intact distal pulses.   Pulmonary/Chest: Effort normal. No stridor. No respiratory distress.  Abdominal: He exhibits no distension and no mass. There is no tenderness. There is no guarding.  Musculoskeletal: He exhibits no edema.  Neurological: He is alert and oriented to person, place, and time.  Skin: Skin is warm and dry.  Psychiatric: He has a normal mood and affect.  Nursing note and vitals reviewed.    ED Treatments / Results  Labs (all labs ordered are listed, but only abnormal results are displayed) Labs Reviewed  URINALYSIS, ROUTINE W REFLEX MICROSCOPIC - Abnormal; Notable for the following:       Result Value   Color, Urine RED (*)    Hgb urine dipstick LARGE (*)    Bilirubin Urine MODERATE (*)    Ketones, ur 15 (*)    Protein, ur 100 (*)    Nitrite POSITIVE (*)    Leukocytes, UA SMALL (*)    All other components within normal limits  URINALYSIS, MICROSCOPIC (REFLEX) - Abnormal; Notable for the following:    Bacteria, UA RARE (*)    Squamous Epithelial / LPF 0-5 (*)     All other components within normal limits  URINE CULTURE      Procedures Procedures (including critical care time)  Medications Ordered in ED Medications  ciprofloxacin (CIPRO) tablet 500 mg (not administered)     Initial Impression / Assessment and Plan / ED Course  I have reviewed the triage vital signs and the nursing notes.  Pertinent labs & imaging results that were available during my care of the patient were reviewed by me and considered in my medical decision making (see chart for details).  On repeat exam the patient is in no distress, is awake, alert. We discussed the findings consistent with UTI, concern for possible prostatitis versus cystitis. With unremarkable vital signs, no complaints, no pain, the patient understands the importance of following up with primary care within  1 week, possible need for follow-up with urology.  Final Clinical Impressions(s) / ED Diagnoses   Final diagnoses:  Cystitis    New Prescriptions New Prescriptions   CIPROFLOXACIN (CIPRO) 500 MG TABLET    Take 1 tablet (500 mg total) by mouth 2 (two) times daily.     Carmin Muskrat, MD 12/29/16 [phone]

## 2016-12-30 LAB — URINE CULTURE: Culture: >=100000 — AB

## 2016-12-31 ENCOUNTER — Telehealth: Payer: Self-pay | Admitting: Emergency Medicine

## 2016-12-31 NOTE — Telephone Encounter (Signed)
Post ED Visit - Positive Culture Follow-up  Culture report reviewed by antimicrobial stewardship pharmacist:  []  Elenor Quinones, Pharm.D. []  Heide Guile, Pharm.D., BCPS AQ-ID []  Parks Neptune, Pharm.D., BCPS []  Alycia Rossetti, Pharm.D., BCPS []  [place], [place].D., BCPS, AAHIVP []  Legrand Como, Pharm.D., BCPS, AAHIVP []  Salome Arnt, PharmD, BCPS []  Dimitri Ped, PharmD, BCPS []  Vincenza Hews, PharmD, BCPS  Positive urine culture Treated with ciprofloxacin, organism sensitive to the same and no further patient follow-up is required at this time.  Hazle Nordmann 12/31/2016, 11:50 AM

## 2017-04-26 ENCOUNTER — Other Ambulatory Visit: Payer: Self-pay

## 2017-04-26 DIAGNOSIS — I714 Abdominal aortic aneurysm, without rupture, unspecified: Secondary | ICD-10-CM

## 2017-04-26 DIAGNOSIS — I739 Peripheral vascular disease, unspecified: Secondary | ICD-10-CM

## 2017-05-02 ENCOUNTER — Ambulatory Visit: Payer: Medicare HMO | Admitting: Family

## 2017-05-02 ENCOUNTER — Encounter (HOSPITAL_COMMUNITY): Payer: Medicare HMO

## 2017-05-02 ENCOUNTER — Other Ambulatory Visit (HOSPITAL_COMMUNITY): Payer: Medicare HMO

## 2017-07-30 ENCOUNTER — Ambulatory Visit: Payer: Medicare HMO | Admitting: Family

## 2017-07-30 ENCOUNTER — Encounter (HOSPITAL_COMMUNITY): Payer: Medicare HMO

## 2017-07-30 ENCOUNTER — Other Ambulatory Visit (HOSPITAL_COMMUNITY): Payer: Medicare HMO

## 2017-08-25 ENCOUNTER — Other Ambulatory Visit: Payer: Self-pay

## 2017-08-25 ENCOUNTER — Encounter (HOSPITAL_COMMUNITY): Payer: Self-pay | Admitting: Emergency Medicine

## 2017-08-25 DIAGNOSIS — Z79899 Other long term (current) drug therapy: Secondary | ICD-10-CM | POA: Diagnosis not present

## 2017-08-25 DIAGNOSIS — R350 Frequency of micturition: Secondary | ICD-10-CM | POA: Insufficient documentation

## 2017-08-25 DIAGNOSIS — I10 Essential (primary) hypertension: Secondary | ICD-10-CM | POA: Insufficient documentation

## 2017-08-25 DIAGNOSIS — R1030 Lower abdominal pain, unspecified: Secondary | ICD-10-CM | POA: Insufficient documentation

## 2017-08-25 DIAGNOSIS — K5900 Constipation, unspecified: Secondary | ICD-10-CM | POA: Insufficient documentation

## 2017-08-25 DIAGNOSIS — F17210 Nicotine dependence, cigarettes, uncomplicated: Secondary | ICD-10-CM | POA: Diagnosis not present

## 2017-08-25 LAB — BASIC METABOLIC PANEL
ANION GAP: 14 (ref 5–15)
BUN: 19 mg/dL (ref 6–20)
CALCIUM: 9.8 mg/dL (ref 8.9–10.3)
CO2: 25 mmol/L (ref 22–32)
Chloride: 102 mmol/L (ref 101–111)
Creatinine, Ser: 1.13 mg/dL (ref 0.61–1.24)
Glucose, Bld: 132 mg/dL — ABNORMAL HIGH (ref 65–99)
Potassium: 3.1 mmol/L — ABNORMAL LOW (ref 3.5–5.1)
Sodium: 141 mmol/L (ref 135–145)

## 2017-08-25 LAB — CBC
HEMATOCRIT: 38.5 % — AB (ref 39.0–52.0)
HEMOGLOBIN: 13 g/dL (ref 13.0–17.0)
MCH: 31.8 pg (ref 26.0–34.0)
MCHC: 33.8 g/dL (ref 30.0–36.0)
MCV: 94.1 fL (ref 78.0–100.0)
Platelets: 194 10*3/uL (ref 150–400)
RBC: 4.09 MIL/uL — AB (ref 4.22–5.81)
RDW: 12.9 % (ref 11.5–15.5)
WBC: 5.1 10*3/uL (ref 4.0–10.5)

## 2017-08-25 LAB — URINALYSIS, ROUTINE W REFLEX MICROSCOPIC
Bilirubin Urine: NEGATIVE
Glucose, UA: NEGATIVE mg/dL
KETONES UR: 5 mg/dL — AB
Leukocytes, UA: NEGATIVE
Nitrite: NEGATIVE
PROTEIN: 30 mg/dL — AB
Specific Gravity, Urine: 1.023 (ref 1.005–1.030)
pH: 6 (ref 5.0–8.0)

## 2017-08-25 NOTE — ED Triage Notes (Addendum)
Reports having constipation and urinary frequency and endorses having to strain to urinate.  Endorses using cocaine today.

## 2017-08-26 ENCOUNTER — Emergency Department (HOSPITAL_COMMUNITY): Payer: Medicare HMO

## 2017-08-26 ENCOUNTER — Emergency Department (HOSPITAL_COMMUNITY)
Admission: EM | Admit: 2017-08-26 | Discharge: 2017-08-26 | Disposition: A | Discharge status: Home / Self Care | Payer: Medicare HMO | Attending: Emergency Medicine | Admitting: Emergency Medicine

## 2017-08-26 DIAGNOSIS — R350 Frequency of micturition: Secondary | ICD-10-CM

## 2017-08-26 DIAGNOSIS — K5900 Constipation, unspecified: Secondary | ICD-10-CM

## 2017-08-26 MED ORDER — CIPROFLOXACIN HCL 500 MG PO TABS: 500.0000 mg | ORAL_TABLET | Freq: Two times a day (BID) | ORAL | 0 refills | Status: DC

## 2017-08-26 NOTE — ED Provider Notes (Signed)
[facility] EMERGENCY DEPARTMENT Provider Note   CSN: [id number] Arrival date & time: 08/25/17  2025     History   Chief Complaint Chief Complaint  Patient presents with  . Urinary Frequency  . Constipation    HPI Eric Barnett is a 67 y.o. male.  Patient is a 67 year old male with past medical history of abdominal aortic aneurysm with repair, hypertension, hyperlipidemia.  He presents today for evaluation of urinary frequency and urinating in small amounts along with suprapubic abdominal pain.  This has been ongoing for the past 2 days.  He also reports being constipated for the past several days.  He denies any fevers or chills.  The history is provided by the patient.  Urinary Frequency  This is a new problem. Episode onset: 3 days ago. The problem occurs constantly. The problem has been gradually worsening. Associated symptoms include abdominal pain. Nothing aggravates the symptoms. Nothing relieves the symptoms. He has tried nothing for the symptoms.  Constipation   Associated symptoms include abdominal pain.    Past Medical History:  Diagnosis Date  . AAA (abdominal aortic aneurysm) ([place])   . Hyperchloremia   . Hyperlipidemia   . Hypertension   . Irregular heartbeat   . Vitamin D deficiency     Patient Active Problem List   Diagnosis Date Noted  . Adrenal mass, left ([place]) 09/30/2014  . Adrenal mass, right ([place]) 09/30/2014  . AAA (abdominal aortic aneurysm) ([place]) 08/30/2014  . 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 AAA (abdominal aortic aneurysm) without rupture ([place]) 01/20/2014  . Abdominal aortic aneurysm ([place]) 01/15/2014  . Irregular heart rate 01/15/2014  . OBESITY 06/02/2010  . TOBACCO ABUSE 06/02/2010  . URINALYSIS, ABNORMAL 12/15/2009  . PARESTHESIA 02/15/2009  . DEGENERATIVE JOINT DISEASE, HIPS 09/27/2008  . DERMATOPHYTOSIS, NAIL 05/16/2007  . COCAINE ABUSE, HX OF 01/21/2007  . INGUINAL HERNIORRHAPHIES, BILATERAL, HX OF 01/21/2007   . DYSLIPIDEMIA 01/18/2007  . Essential hypertension 01/18/2007  . PERIODONTAL DISEASE 01/18/2007  . INGUINAL HERNIA, HX OF 01/18/2007    Past Surgical History:  Procedure Laterality Date  . ABDOMINAL AORTIC ENDOVASCULAR STENT GRAFT N/A 08/30/2014   Procedure: ABDOMINAL AORTIC ENDOVASCULAR STENT GRAFT;  Surgeon: Elam Dutch, MD;  Location: [facility];  Service: Vascular;  Laterality: N/A;  . HERNIA REPAIR    . TONSILLECTOMY          Home Medications    Prior to Admission medications   Medication Sig Start Date End Date Taking? Authorizing Provider  atorvastatin (LIPITOR) 40 MG tablet Take 1 tablet (40 mg total) by mouth daily. 08/24/14   Lorretta Harp, MD  benzonatate (TESSALON) 100 MG capsule Take 2 capsules (200 mg total) by mouth 2 (two) times daily as needed for cough. Patient not taking: Reported on 10/25/2016 05/10/16   Nona Dell, PA-C  ciprofloxacin (CIPRO) 500 MG tablet Take 1 tablet (500 mg total) by mouth 2 (two) times daily. 12/29/16   Carmin Muskrat, MD  losartan-hydrochlorothiazide (HYZAAR) 100-12.5 MG tablet Take 1 tablet by mouth daily. Patient not taking: Reported on 12/29/2016 02/12/16   Frederica Kuster, PA-C  losartan-hydrochlorothiazide (HYZAAR) 100-25 MG tablet Take 1 tablet by mouth every evening.    [provider]  oxyCODONE-acetaminophen (PERCOCET/ROXICET) 5-325 MG per tablet Take 1-2 tablets by mouth every 4 (four) hours as needed for moderate pain. Patient not taking: Reported on 02/12/2016 08/31/14   Ulyses Amor, PA-C  oxymetazoline (AFRIN NASAL SPRAY) 0.05 % nasal spray Place 1 spray  into both nostrils 2 (two) times daily. Spray once into each nostril twice daily for up to the next 3 days. Do not use for more than 3 days to prevent rebound rhinorrhea. Patient not taking: Reported on 10/25/2016 05/10/16   Nona Dell, PA-C  traMADol (ULTRAM) 50 MG tablet Take 1 tablet (50 mg total) by mouth every 6 (six) hours as  needed. Patient not taking: Reported on 02/12/2016 09/02/14   Elam Dutch, MD    Family History No family history on file.  Social History Social History   Tobacco Use  . Smoking status: Current Some Day Smoker    Packs/day: 0.25    Years: 17.00    Pack years: 4.25    Types: Cigarettes  . Smokeless tobacco: Former Systems developer    Quit date: 01/20/1974  Substance Use Topics  . Alcohol use: Yes    Alcohol/week: 0.0 oz  . Drug use: Yes    Types: Marijuana, Cocaine     Allergies   Aspirin   Review of Systems Review of Systems  Gastrointestinal: Positive for abdominal pain and constipation.  Genitourinary: Positive for frequency.  All other systems reviewed and are negative.    Physical Exam Updated Vital Signs BP 116/67 (BP Location: Right Arm)   Pulse 85   Temp 98.3 F (36.8 C) (Oral)   Resp 18   Ht 6\' 3"  (1.905 m)   Wt 81.6 kg (180 lb)   SpO2 (!) 9%   BMI 22.50 kg/m   Physical Exam  Constitutional: He is oriented to person, place, and time. He appears well-developed and well-nourished. No distress.  HENT:  Head: Normocephalic and atraumatic.  Mouth/Throat: Oropharynx is clear and moist.  Neck: Normal range of motion. Neck supple.  Cardiovascular: Normal rate and regular rhythm. Exam reveals no friction rub.  No murmur heard. Pulmonary/Chest: Effort normal and breath sounds normal. No respiratory distress. He has no wheezes. He has no rales.  Abdominal: Soft. Bowel sounds are normal. He exhibits no distension. There is no tenderness.  Musculoskeletal: Normal range of motion. He exhibits no edema.  Neurological: He is alert and oriented to person, place, and time. Coordination normal.  Skin: Skin is warm and dry. He is not diaphoretic.  Nursing note and vitals reviewed.    ED Treatments / Results  Labs (all labs ordered are listed, but only abnormal results are displayed) Labs Reviewed  URINALYSIS, ROUTINE W REFLEX MICROSCOPIC - Abnormal; Notable for the  following components:      Result Value   Hgb urine dipstick MODERATE (*)    Ketones, ur 5 (*)    Protein, ur 30 (*)    Bacteria, UA RARE (*)    Squamous Epithelial / LPF 0-5 (*)    All other components within normal limits  BASIC METABOLIC PANEL - Abnormal; Notable for the following components:   Potassium 3.1 (*)    Glucose, Bld 132 (*)    All other components within normal limits  CBC - Abnormal; Notable for the following components:   RBC 4.09 (*)    HCT 38.5 (*)    All other components within normal limits  URINE CULTURE    EKG None  Radiology No results found.  Procedures Procedures (including critical care time)  Medications Ordered in ED Medications - No data to display   Initial Impression / Assessment and Plan / ED Course  I have reviewed the triage vital signs and the nursing notes.  Pertinent labs & imaging results  that were available during my care of the patient were reviewed by me and considered in my medical decision making (see chart for details).  Patient with constipation and urinary urgency.  His urinalysis shows moderate blood, however CT scan fails to show a renal calculus.  It does show increased stool.    He will be treated with magnesium citrate for constipation and Cipro for his urinary frequency.  Final Clinical Impressions(s) / ED Diagnoses   Final diagnoses:  None    ED Discharge Orders    None       Veryl Speak, MD 08/26/17 [phone]

## 2017-08-26 NOTE — Discharge Instructions (Addendum)
Cipro as prescribed.  Magnesium citrate: The entire 10 ounce bottle mixed with equal parts Sprite or Gatorade for relief of constipation.  Follow-up with your primary doctor if symptoms are not improving in the next week.

## 2017-08-26 NOTE — ED Notes (Signed)
Patient transported to CT 

## 2017-08-27 LAB — URINE CULTURE: CULTURE: NO GROWTH

## 2017-10-07 ENCOUNTER — Other Ambulatory Visit: Payer: Self-pay | Admitting: Urology

## 2017-10-07 DIAGNOSIS — R310 Gross hematuria: Secondary | ICD-10-CM

## 2017-10-11 ENCOUNTER — Ambulatory Visit (HOSPITAL_COMMUNITY)
Admission: RE | Admit: 2017-10-11 | Discharge: 2017-10-11 | Disposition: A | Discharge status: Home / Self Care | Payer: Medicare HMO | Source: Ambulatory Visit | Attending: Urology | Admitting: Urology

## 2017-10-11 ENCOUNTER — Encounter (HOSPITAL_COMMUNITY): Payer: Self-pay

## 2017-10-11 DIAGNOSIS — N401 Enlarged prostate with lower urinary tract symptoms: Secondary | ICD-10-CM | POA: Diagnosis not present

## 2017-10-11 DIAGNOSIS — D3501 Benign neoplasm of right adrenal gland: Secondary | ICD-10-CM | POA: Diagnosis not present

## 2017-10-11 DIAGNOSIS — R310 Gross hematuria: Secondary | ICD-10-CM | POA: Diagnosis present

## 2017-10-11 DIAGNOSIS — D3502 Benign neoplasm of left adrenal gland: Secondary | ICD-10-CM | POA: Insufficient documentation

## 2017-10-11 DIAGNOSIS — I700 Atherosclerosis of aorta: Secondary | ICD-10-CM | POA: Diagnosis not present

## 2017-10-11 MED ORDER — SODIUM CHLORIDE 0.9 % IV SOLN
INTRAVENOUS | Status: AC
  Filled 2017-10-11: qty 250

## 2017-10-11 MED ORDER — IOPAMIDOL (ISOVUE-300) INJECTION 61%
INTRAVENOUS | Status: AC
  Filled 2017-10-11: qty 100

## 2017-10-11 MED ORDER — IOPAMIDOL (ISOVUE-300) INJECTION 61%
100.0000 mL | Freq: Once | INTRAVENOUS | Status: AC | PRN
  Administered 2017-10-11: 100 mL via INTRAVENOUS

## 2017-11-01 ENCOUNTER — Ambulatory Visit (HOSPITAL_COMMUNITY)
Admission: RE | Admit: 2017-11-01 | Discharge: 2017-11-01 | Disposition: A | Discharge status: Home / Self Care | Payer: Medicare HMO | Source: Ambulatory Visit | Attending: Family | Admitting: Family

## 2017-11-01 ENCOUNTER — Ambulatory Visit (INDEPENDENT_AMBULATORY_CARE_PROVIDER_SITE_OTHER): Payer: Medicare HMO | Admitting: Family

## 2017-11-01 ENCOUNTER — Other Ambulatory Visit: Payer: Self-pay

## 2017-11-01 ENCOUNTER — Ambulatory Visit (INDEPENDENT_AMBULATORY_CARE_PROVIDER_SITE_OTHER)
Admission: RE | Admit: 2017-11-01 | Discharge: 2017-11-01 | Disposition: A | Discharge status: Home / Self Care | Payer: Medicare HMO | Source: Ambulatory Visit | Attending: Family | Admitting: Family

## 2017-11-01 ENCOUNTER — Encounter: Payer: Self-pay | Admitting: Family

## 2017-11-01 VITALS — BP 140/90 | HR 69 | Resp 18 | Ht 75.0 in | Wt 172.1 lb

## 2017-11-01 DIAGNOSIS — Z95828 Presence of other vascular implants and grafts: Secondary | ICD-10-CM

## 2017-11-01 DIAGNOSIS — F17200 Nicotine dependence, unspecified, uncomplicated: Secondary | ICD-10-CM

## 2017-11-01 DIAGNOSIS — I714 Abdominal aortic aneurysm, without rupture, unspecified: Secondary | ICD-10-CM

## 2017-11-01 DIAGNOSIS — I779 Disorder of arteries and arterioles, unspecified: Secondary | ICD-10-CM | POA: Diagnosis not present

## 2017-11-01 DIAGNOSIS — I739 Peripheral vascular disease, unspecified: Secondary | ICD-10-CM | POA: Diagnosis not present

## 2017-11-01 NOTE — Progress Notes (Signed)
[facility]  CC: Follow up s/p Endovascular Repair of Abdominal Aortic Aneurysm and PAD  History of Present Illness  Eric Barnett is a 67 y.o. (01/30/1951) male who complains of pain in his right calf after walking about 2 blocks.   He states this is been going on since about 2016. He states that recently it has improved some but still limits his ability to walk long distances. He denies any symptoms in the left leg. He is known to our practice from previous endovascular stent graft repair of abdominal aortic aneurysm April 2016.  Unfortunately he was lost to follow-up for some time.  He denies any abdominal or back pain. He was smoking about a half a pack cigarettes per day.  Other medical problems include hypertension hyperlipidemia both of which have been stable. He denies rest pain or nonhealing wounds.  Dr. Oneida Alar last evaluated pt on 10-25-16. At that time pt had mild lower extremity claudication symptoms. His ABIs showed adequate perfusion that do not put him at risk for limb loss currently. Dr. Oneida Alar discussed with the patient  the options of a walking program of 30 minutes daily and smoking cessation which usually improves patient symptoms 75% of the time.  The other option would be to proceed with aortogram lower extremity runoff for possible superficial femoral artery occlusive disease. The patient at that time did not feel that limited by his symptoms. He wishds to try and exercise program first. He was to follow-up with [place] in 6 months time for a duplex of his aortic aneurysm stent graft as well as repeat ABIs. If he decides to proceed with an arteriogram sooner he can call us at any time.  He has occasional abdominal pain that is relieved by H2 blocker.  He denies any new back pain, he has occasional back pain for years.    Diabetic: No Tobaccos use: smoker  (he admits to 2-3 cigs/day, started about age 12 yrs)   Past Medical History:  Diagnosis  Date  . AAA (abdominal aortic aneurysm) ([place])   . Hyperchloremia   . Hyperlipidemia   . Hypertension   . Irregular heartbeat   . Vitamin D deficiency    Past Surgical History:  Procedure Laterality Date  . ABDOMINAL AORTIC ENDOVASCULAR STENT GRAFT N/A 08/30/2014   Procedure: ABDOMINAL AORTIC ENDOVASCULAR STENT GRAFT;  Surgeon: Elam Dutch, MD;  Location: [facility];  Service: Vascular;  Laterality: N/A;  . HERNIA REPAIR    . TONSILLECTOMY     Social History Social History   Tobacco Use  . Smoking status: Current Some Day Smoker    Packs/day: 0.25    Years: 17.00    Pack years: 4.25    Types: Cigarettes  . Smokeless tobacco: Former Systems developer    Quit date: 01/20/1974  Substance Use Topics  . Alcohol use: Yes    Alcohol/week: 0.0 oz  . Drug use: Yes    Types: Marijuana, Cocaine   Family History History reviewed. No pertinent family history. Current Outpatient Medications on File Prior to Visit  Medication Sig Dispense Refill  . atorvastatin (LIPITOR) 40 MG tablet Take 1 tablet (40 mg total) by mouth daily. 30 tablet 6  . losartan-hydrochlorothiazide (HYZAAR) 100-12.5 MG tablet Take 1 tablet by mouth daily. 30 tablet 0  . benzonatate (TESSALON) 100 MG capsule Take 2 capsules (200 mg total) by mouth 2 (two) times daily as needed for cough. (Patient not taking: Reported on 10/25/2016) 20 capsule 0  .  ciprofloxacin (CIPRO) 500 MG tablet Take 1 tablet (500 mg total) by mouth 2 (two) times daily. One po bid x 7 days (Patient not taking: Reported on 11/01/2017) 14 tablet 0  . losartan-hydrochlorothiazide (HYZAAR) 100-25 MG tablet Take 1 tablet by mouth every evening.    Marland Kitchen oxyCODONE-acetaminophen (PERCOCET/ROXICET) 5-325 MG per tablet Take 1-2 tablets by mouth every 4 (four) hours as needed for moderate pain. (Patient not taking: Reported on 02/12/2016) 30 tablet 0  . oxymetazoline (AFRIN NASAL SPRAY) 0.05 % nasal spray Place 1 spray into both nostrils 2 (two) times daily. Spray once into each  nostril twice daily for up to the next 3 days. Do not use for more than 3 days to prevent rebound rhinorrhea. (Patient not taking: Reported on 10/25/2016) 30 mL 0  . traMADol (ULTRAM) 50 MG tablet Take 1 tablet (50 mg total) by mouth every 6 (six) hours as needed. (Patient not taking: Reported on 02/12/2016) 20 tablet 0   No current facility-administered medications on file prior to visit.    Allergies  Allergen Reactions  . Aspirin Nausea Only    REACTION :HIGH DOSE  Upset patients   stomach-      ROS: See HPI for pertinent positives and negatives.  Physical Examination  Vitals:   11/01/17 0917  BP: 140/90  Pulse: 69  Resp: 18  SpO2: 98%  Weight: 172 lb 1.6 oz (78.1 kg)  Height: 6\' 3"  (1.905 m)   Body mass index is 21.51 kg/m.  General: A&O x 3, WD, male HEENT: No gross abnormalities, few missing teeth.  Pulmonary: Sym exp, respirations are non labored, good air movt, CTAB, no rales, rhonchi, or wheezing.  Cardiac: Regular rhythm and rate, no murmur appreciated  Vascular: Vessel Right Left  Radial 2+Palpable 2+Palpable  Carotid  without bruit  without bruit  Aorta Not palpable N/A  Femoral faintlyPalpable 2+Palpable  Popliteal Not palpable Not palpable  PT Not Palpable Not Palpable  DP Not palpable Not Palpable   Gastrointestinal: soft, NTND, -G/R, - HSM, - palpable masses, - CVAT B. Musculoskeletal: M/S 5/5 throughout, extremities without ischemic changes. Skin: No rashes, no ulcers, no cellulitis.   Neurologic: Pain and light touch intact in extremities, Motor exam as listed above. Psychiatric: Normal thought content, mood appropriate for clinical situation.     DATA  EVAR Duplex   Current (Date: 11-01-17)  AAA sac size: 4.0 cm; Right CIA: 1.8 cm; Left CIA: 1.8 cm  no endoleak detected  CTA Abd/Pelvis Duplex (Date: 10-12-17)  AAA sac size: 4.1 cm    ABI (Date: 11/01/2017):  R:   ABI: 0.78 (was 0.87 on 10-25-16),   PT: mono  DP: mono  TBI:  0.52  (was 0.68)  L:   ABI: 0.86 (was 0.96),   PT: mono  DP: mono  TBI: 0.60 (was 0.72)  Decline in bilateral ABI and TBI. Moderate disease in the right, mild in the left with all monophasic waveforms.      Medical Decision Making  Eric Barnett is a 67 y.o. male who presents s/p EVAR (Date: April 2016).  Pt is symptomatic with stable sac size, at 4.0 cm today, compared to 4.1 cm by CT on 10-12-17. He has mild claudication, not life limiting; he again declined intervention, choosing a graduated walking program, maximal medical management, and smoking cessation.   I discussed with the patient the importance of surveillance of the endograft.  The next endograft duplex will be scheduled for 12 months, will also check ABI's.  The patient will follow up with [place] in 12 months with these studies.  I advised pt to walk at least 30 minutes daily.  Over 3 minutes was spent counseling patient re smoking cessation, and patient was given several free resources re smoking cessation.    I emphasized the importance of maximal medical management including strict control of blood pressure, blood glucose, and lipid levels, antiplatelet agents, obtaining regular exercise, and cessation of smoking.   Thank you for allowing [place] to participate in this patient's care.  Clemon Chambers, RN, MSN, FNP-C [facility] Office: [phone]  Clinic Physician: Donzetta Matters  11/01/2017, 9:38 AM

## 2017-11-01 NOTE — Patient Instructions (Signed)
Steps to Quit Smoking Smoking tobacco can be bad for your health. It can also affect almost every organ in your body. Smoking puts you and people around you at risk for many serious long-lasting (chronic) diseases. Quitting smoking is hard, but it is one of the best things that you can do for your health. It is never too late to quit. What are the benefits of quitting smoking? When you quit smoking, you lower your risk for getting serious diseases and conditions. They can include:  Lung cancer or lung disease.  Heart disease.  Stroke.  Heart attack.  Not being able to have children (infertility).  Weak bones (osteoporosis) and broken bones (fractures).  If you have coughing, wheezing, and shortness of breath, those symptoms may get better when you quit. You may also get sick less often. If you are pregnant, quitting smoking can help to lower your chances of having a baby of low birth weight. What can I do to help me quit smoking? Talk with your doctor about what can help you quit smoking. Some things you can do (strategies) include:  Quitting smoking totally, instead of slowly cutting back how much you smoke over a period of time.  Going to in-person counseling. You are more likely to quit if you go to many counseling sessions.  Using resources and support systems, such as: ? Online chats with a counselor. ? Phone quitlines. ? Printed self-help materials. ? Support groups or group counseling. ? Text messaging programs. ? Mobile phone apps or applications.  Taking medicines. Some of these medicines may have nicotine in them. If you are pregnant or breastfeeding, do not take any medicines to quit smoking unless your doctor says it is okay. Talk with your doctor about counseling or other things that can help you.  Talk with your doctor about using more than one strategy at the same time, such as taking medicines while you are also going to in-person counseling. This can help make  quitting easier. What things can I do to make it easier to quit? Quitting smoking might feel very hard at first, but there is a lot that you can do to make it easier. Take these steps:  Talk to your family and friends. Ask them to support and encourage you.  Call phone quitlines, reach out to support groups, or work with a counselor.  Ask people who smoke to not smoke around you.  Avoid places that make you want (trigger) to smoke, such as: ? Bars. ? Parties. ? Smoke-break areas at work.  Spend time with people who do not smoke.  Lower the stress in your life. Stress can make you want to smoke. Try these things to help your stress: ? Getting regular exercise. ? Deep-breathing exercises. ? Yoga. ? Meditating. ? Doing a body scan. To do this, close your eyes, focus on one area of your body at a time from head to toe, and notice which parts of your body are tense. Try to relax the muscles in those areas.  Download or buy apps on your mobile phone or tablet that can help you stick to your quit plan. There are many free apps, such as QuitGuide from the CDC (Centers for Disease Control and Prevention). You can find more support from smokefree.gov and other websites.  This information is not intended to replace advice given to you by your health care provider. Make sure you discuss any questions you have with your health care provider. Document Released: 03/10/2009 Document   Revised: 01/10/2016 Document Reviewed: 09/28/2014 Elsevier Interactive Patient Education  2018 Elsevier Inc.     Peripheral Vascular Disease Peripheral vascular disease (PVD) is a disease of the blood vessels that are not part of your heart and brain. A simple term for PVD is poor circulation. In most cases, PVD narrows the blood vessels that carry blood from your heart to the rest of your body. This can result in a decreased supply of blood to your arms, legs, and internal organs, like your stomach or kidneys.  However, it most often affects a person's lower legs and feet. There are two types of PVD.  Organic PVD. This is the more common type. It is caused by damage to the structure of blood vessels.  Functional PVD. This is caused by conditions that make blood vessels contract and tighten (spasm).  Without treatment, PVD tends to get worse over time. PVD can also lead to acute ischemic limb. This is when an arm or limb suddenly has trouble getting enough blood. This is a medical emergency. Follow these instructions at home:  Take medicines only as told by your doctor.  Do not use any tobacco products, including cigarettes, chewing tobacco, or electronic cigarettes. If you need help quitting, ask your doctor.  Lose weight if you are overweight, and maintain a healthy weight as told by your doctor.  Eat a diet that is low in fat and cholesterol. If you need help, ask your doctor.  Exercise regularly. Ask your doctor for some good activities for you.  Take good care of your feet. ? Wear comfortable shoes that fit well. ? Check your feet often for any cuts or sores. Contact a doctor if:  You have cramps in your legs while walking.  You have leg pain when you are at rest.  You have coldness in a leg or foot.  Your skin changes.  You are unable to get or have an erection (erectile dysfunction).  You have cuts or sores on your feet that are not healing. Get help right away if:  Your arm or leg turns cold and blue.  Your arms or legs become red, warm, swollen, painful, or numb.  You have chest pain or trouble breathing.  You suddenly have weakness in your face, arm, or leg.  You become very confused or you cannot speak.  You suddenly have a very bad headache.  You suddenly cannot see. This information is not intended to replace advice given to you by your health care provider. Make sure you discuss any questions you have with your health care provider. Document Released:  08/08/2009 Document Revised: 10/20/2015 Document Reviewed: 10/22/2013 Elsevier Interactive Patient Education  2017 Elsevier Inc.  

## 2017-11-18 ENCOUNTER — Other Ambulatory Visit: Payer: Self-pay | Admitting: Urology

## 2017-12-16 ENCOUNTER — Inpatient Hospital Stay (HOSPITAL_COMMUNITY): Payer: Medicare HMO | Admitting: Anesthesiology

## 2017-12-26 NOTE — Patient Instructions (Signed)
Drexel Ivey  12/26/2017   Your procedure is scheduled on:01-01-18  Report to [facility] Main  Entrance  Report to admitting at       [place] AM    Call this number if you have problems the morning of surgery [phone]   Remember: Do not eat food or drink liquids :After Midnight.     Take these medicines the morning of surgery with A SIP OF WATER: LIPITOR, TAMULOSIN                                You may not have any metal on your body including hair pins and              piercings  Do not wear jewelry,lotions, powders or perfumes, deodorant                  Men may shave face and neck.   Do not bring valuables to the hospital. [place].  Contacts, dentures or bridgework may not be worn into surgery.  Leave suitcase in the car. After surgery it may be brought to your room.               Please read over the following fact sheets you were given: _____________________________________________________________________         [facility] - Preparing for Surgery Before surgery, you can play an important role.  Because skin is not sterile, your skin needs to be as free of germs as possible.  You can reduce the number of germs on your skin by washing with CHG (chlorahexidine gluconate) soap before surgery.  CHG is an antiseptic cleaner which kills germs and bonds with the skin to continue killing germs even after washing. Please DO NOT use if you have an allergy to CHG or antibacterial soaps.  If your skin becomes reddened/irritated stop using the CHG and inform your nurse when you arrive at Short Stay. Do not shave (including legs and underarms) for at least 48 hours prior to the first CHG shower.  You may shave your face/neck. Please follow these instructions carefully:  1.  Shower with CHG Soap the night before surgery and the  morning of Surgery.  2.  If you choose to wash your hair, wash your hair first as usual  with your  normal  shampoo.  3.  After you shampoo, rinse your hair and body thoroughly to remove the  shampoo.                           4.  Use CHG as you would any other liquid soap.  You can apply chg directly  to the skin and wash                       Gently with a scrungie or clean washcloth.  5.  Apply the CHG Soap to your body ONLY FROM THE NECK DOWN.   Do not use on face/ open                           Wound or open sores. Avoid contact with eyes, ears mouth  and genitals (private parts).                       Wash face,  Genitals (private parts) with your normal soap.             6.  Wash thoroughly, paying special attention to the area where your surgery  will be performed.  7.  Thoroughly rinse your body with warm water from the neck down.  8.  DO NOT shower/wash with your normal soap after using and rinsing off  the CHG Soap.                9.  Pat yourself dry with a clean towel.            10.  Wear clean pajamas.            11.  Place clean sheets on your bed the night of your first shower and do not  sleep with pets. Day of Surgery : Do not apply any lotions/deodorants the morning of surgery.  Please wear clean clothes to the hospital/surgery center.  FAILURE TO FOLLOW THESE INSTRUCTIONS MAY RESULT IN THE CANCELLATION OF YOUR SURGERY PATIENT SIGNATURE_________________________________  NURSE SIGNATURE__________________________________  ________________________________________________________________________

## 2017-12-27 ENCOUNTER — Encounter (HOSPITAL_COMMUNITY): Payer: Self-pay

## 2017-12-27 ENCOUNTER — Other Ambulatory Visit: Payer: Self-pay

## 2017-12-27 ENCOUNTER — Encounter (HOSPITAL_COMMUNITY)
Admission: RE | Admit: 2017-12-27 | Discharge: 2017-12-27 | Disposition: A | Discharge status: Home / Self Care | Payer: Medicare HMO | Source: Ambulatory Visit | Attending: Urology | Admitting: Urology

## 2017-12-27 DIAGNOSIS — Z01810 Encounter for preprocedural cardiovascular examination: Secondary | ICD-10-CM | POA: Insufficient documentation

## 2017-12-27 DIAGNOSIS — Z01812 Encounter for preprocedural laboratory examination: Secondary | ICD-10-CM | POA: Insufficient documentation

## 2017-12-27 LAB — CBC
HCT: 40.2 % (ref 39.0–52.0)
Hemoglobin: 13.4 g/dL (ref 13.0–17.0)
MCH: 31.5 pg (ref 26.0–34.0)
MCHC: 33.3 g/dL (ref 30.0–36.0)
MCV: 94.4 fL (ref 78.0–100.0)
PLATELETS: 210 10*3/uL (ref 150–400)
RBC: 4.26 MIL/uL (ref 4.22–5.81)
RDW: 13.1 % (ref 11.5–15.5)
WBC: 4.2 10*3/uL (ref 4.0–10.5)

## 2017-12-27 NOTE — Progress Notes (Signed)
Dr. Royce Macadamia aware of EKG no further orders.

## 2017-12-28 LAB — ABO/RH: ABO/RH(D): O POS

## 2017-12-31 NOTE — H&P (Signed)
Urology Preoperative H&P   Chief Complaint: Left adrenal mass  History of Present Illness: Eric Barnett is a 67 y.o. male with referred by Eric Barnett with a history of hematuria and a 7 cm left adrenal mass. He also has a history of a AAA and is s/p EVAR in April 2016.   The adrenal mass was first noted on CT angio abd/pel from 07/2014 and measured 6.7 cm x 4.8 cm at that time. Repeat CT abd/pel (10/11/17) showed interval growth of the left adrenal mass, now measuring 5.5 cm x 7.5 cm. The mass also showed enhancement with minimal washout on delayed imaging.   LABS 10/28/2017  Plasma metanephrines-52 (WNR)  Aldosterone-2 (WNR)  Cortisol-0.38 [facility])   From a urinary standpoint, he reports resolution of his gross hematuria. He was started on tamsulosin at his last office visit and reports some improvement in his force of stream and denies less urgency/frequency. Denies interval UTIs, dysuria or gross hematuria.    Past Medical History:  Diagnosis Date  . AAA (abdominal aortic aneurysm) ([place])   . Hyperchloremia   . Hyperlipidemia   . Hypertension   . Irregular heartbeat   . Vitamin D deficiency     Past Surgical History:  Procedure Laterality Date  . ABDOMINAL AORTIC ENDOVASCULAR STENT GRAFT N/A 08/30/2014   Procedure: ABDOMINAL AORTIC ENDOVASCULAR STENT GRAFT;  Surgeon: Eric Dutch, MD;  Location: [facility];  Service: Vascular;  Laterality: N/A;  . HERNIA REPAIR     bil groins " several time"  . ROBOTIC ADRENALECTOMY     Dr. Lovena Barnett 01-01-18  . TONSILLECTOMY      Allergies:  Allergies  Allergen Reactions  . Aspirin Nausea Only and Other (See Comments)    REACTION :HIGH DOSE  Upset patients   stomach-     No family history on file.  Social History:  reports that he has been smoking cigarettes.  He has a 4.25 pack-year smoking history. He quit smokeless tobacco use about 43 years ago. He reports that he drank alcohol. He reports that he has current or past drug history. Drugs: Marijuana  and Cocaine.  ROS: A complete review of systems was performed.  All systems are negative except for pertinent findings as noted.  Physical Exam:  Vital signs in last 24 hours:   Constitutional:  Alert and oriented, No acute distress Cardiovascular: Regular rate and rhythm, No JVD Respiratory: Normal respiratory effort, Lungs clear bilaterally GI: Abdomen is soft, nontender, nondistended, no abdominal masses GU: No CVA tenderness Lymphatic: No lymphadenopathy Neurologic: Grossly intact, no focal deficits Psychiatric: Normal mood and affect  Laboratory Data:  No results for input(s): WBC, HGB, HCT, PLT in the last 72 hours.  No results for input(s): NA, K, CL, GLUCOSE, BUN, CALCIUM, CREATININE in the last 72 hours.  Invalid input(s): CO3   No results found for this or any previous visit (from the past 24 hour(s)). No results found for this or any previous visit (from the past 240 hour(s)).  Renal Function: No results for input(s): CREATININE in the last 168 hours. CrCl cannot be calculated (Patient's most recent lab result is older than the maximum 21 days allowed.).  Radiologic Imaging: No results found.  I independently reviewed the above imaging studies.  Assessment and Plan Eric Barnett is a 67 y.o. male with 7.5 cm solid and enhancing left adrenal mass with features concerning for malignancy  -The risks, benefits and alternatives of robotic-assisted laparoscopic left adrenalectomy was discussed with the patient. Risks  include, but are not limited to: Bleeding, infection, positive surgical margin, benign pathology, open conversion, left renal loss, adjacent organ injury, MI, stroke, PE, DVT inherent risk of general anesthesia. He voices understanding and wishes to proceed.   Eric Hughs, MD 12/31/2017, 9:45 PM  [facility] Pager: [phone]

## 2017-12-31 NOTE — Anesthesia Preprocedure Evaluation (Deleted)
Anesthesia Evaluation  Patient identified by MRN, date of birth, ID band Patient awake    Reviewed: Allergy & Precautions, NPO status , Patient's Chart, lab work & pertinent test results  Airway Mallampati: III  TM Distance: <3 FB Neck ROM: Full  Mouth opening: Limited Mouth Opening  Dental  (+) Poor Dentition, Loose, Chipped, Missing, Dental Advisory Given   Pulmonary Current Smoker,    Pulmonary exam normal breath sounds clear to auscultation       Cardiovascular hypertension, + Peripheral Vascular Disease  Normal cardiovascular exam Rhythm:Regular Rate:Normal     Neuro/Psych negative neurological ROS  negative psych ROS   GI/Hepatic negative GI ROS, (+)     substance abuse  alcohol use and cocaine use,   Endo/Other  negative endocrine ROS  Renal/GU negative Renal ROS  negative genitourinary   Musculoskeletal negative musculoskeletal ROS (+)   Abdominal   Peds negative pediatric ROS (+)  Hematology negative hematology ROS (+)   Anesthesia Other Findings   Reproductive/Obstetrics negative OB ROS                           Anesthesia Physical Anesthesia Plan  ASA: III  Anesthesia Plan: General   Post-op Pain Management:    Induction: Intravenous  PONV Risk Score and Plan: 2 and Ondansetron, Dexamethasone and Treatment may vary due to age or medical condition  Airway Management Planned: Oral ETT  Additional Equipment:   Intra-op Plan:   Post-operative Plan: Extubation in OR  Informed Consent: I have reviewed the patients History and Physical, chart, labs and discussed the procedure including the risks, benefits and alternatives for the proposed anesthesia with the patient or authorized representative who has indicated his/her understanding and acceptance.   Dental advisory given  Plan Discussed with: CRNA and Surgeon  Anesthesia Plan Comments:         Anesthesia  Quick Evaluation

## 2018-01-01 ENCOUNTER — Encounter (HOSPITAL_COMMUNITY)
Admission: RE | Disposition: A | Discharge status: Home / Self Care | Payer: Self-pay | Source: Ambulatory Visit | Attending: Urology

## 2018-01-01 ENCOUNTER — Encounter (HOSPITAL_COMMUNITY): Payer: Self-pay | Admitting: Certified Registered"

## 2018-01-01 ENCOUNTER — Ambulatory Visit (HOSPITAL_COMMUNITY)
Admission: RE | Admit: 2018-01-01 | Discharge: 2018-01-01 | Disposition: A | Discharge status: Home / Self Care | Payer: Medicare HMO | Source: Ambulatory Visit | Attending: Urology | Admitting: Urology

## 2018-01-01 DIAGNOSIS — F17210 Nicotine dependence, cigarettes, uncomplicated: Secondary | ICD-10-CM | POA: Insufficient documentation

## 2018-01-01 DIAGNOSIS — E278 Other specified disorders of adrenal gland: Secondary | ICD-10-CM | POA: Insufficient documentation

## 2018-01-01 DIAGNOSIS — I739 Peripheral vascular disease, unspecified: Secondary | ICD-10-CM | POA: Insufficient documentation

## 2018-01-01 DIAGNOSIS — I10 Essential (primary) hypertension: Secondary | ICD-10-CM | POA: Insufficient documentation

## 2018-01-01 DIAGNOSIS — R82998 Other abnormal findings in urine: Secondary | ICD-10-CM | POA: Insufficient documentation

## 2018-01-01 DIAGNOSIS — Z5309 Procedure and treatment not carried out because of other contraindication: Secondary | ICD-10-CM | POA: Diagnosis not present

## 2018-01-01 LAB — BASIC METABOLIC PANEL
Anion gap: 8 (ref 5–15)
BUN: 18 mg/dL (ref 8–23)
CHLORIDE: 106 mmol/L (ref 98–111)
CO2: 29 mmol/L (ref 22–32)
Calcium: 10 mg/dL (ref 8.9–10.3)
Creatinine, Ser: 1.07 mg/dL (ref 0.61–1.24)
GFR calc non Af Amer: >60 mL/min (ref >60)
Glucose, Bld: 108 mg/dL — ABNORMAL HIGH (ref 70–99)
POTASSIUM: 3.9 mmol/L (ref 3.5–5.1)
SODIUM: 143 mmol/L (ref 135–145)

## 2018-01-01 LAB — TYPE AND SCREEN
ABO/RH(D): O POS
ANTIBODY SCREEN: NEGATIVE

## 2018-01-01 LAB — RAPID URINE DRUG SCREEN, HOSP PERFORMED
Amphetamines: NOT DETECTED
BARBITURATES: NOT DETECTED
BENZODIAZEPINES: NOT DETECTED
COCAINE: POSITIVE — AB
OPIATES: NOT DETECTED
TETRAHYDROCANNABINOL: NOT DETECTED

## 2018-01-01 SURGERY — ADRENALECTOMY, ROBOT-ASSISTED
Anesthesia: General | Laterality: Left

## 2018-01-01 MED ORDER — LACTATED RINGERS IV SOLN
INTRAVENOUS | Status: DC
  Administered 2018-01-01: 07:00:00 via INTRAVENOUS

## 2018-01-01 MED ORDER — CEFAZOLIN SODIUM-DEXTROSE 2-4 GM/100ML-% IV SOLN
2.0000 g | Freq: Once | INTRAVENOUS | Status: DC
  Filled 2018-01-01: qty 100

## 2018-01-01 MED ORDER — FENTANYL CITRATE (PF) 250 MCG/5ML IJ SOLN
INTRAMUSCULAR | Status: AC
  Filled 2018-01-01: qty 5

## 2018-01-01 MED ORDER — DEXAMETHASONE SODIUM PHOSPHATE 10 MG/ML IJ SOLN
INTRAMUSCULAR | Status: AC
  Filled 2018-01-01: qty 1

## 2018-01-01 MED ORDER — PROPOFOL 10 MG/ML IV BOLUS
INTRAVENOUS | Status: AC
  Filled 2018-01-01: qty 20

## 2018-01-01 MED ORDER — ROCURONIUM BROMIDE 10 MG/ML (PF) SYRINGE
PREFILLED_SYRINGE | INTRAVENOUS | Status: AC
  Filled 2018-01-01: qty 10

## 2018-01-01 MED ORDER — MIDAZOLAM HCL 2 MG/2ML IJ SOLN
INTRAMUSCULAR | Status: AC
  Filled 2018-01-01: qty 2

## 2018-01-01 MED ORDER — ONDANSETRON HCL 4 MG/2ML IJ SOLN
INTRAMUSCULAR | Status: AC
Start: 2018-01-01 — End: ?
  Filled 2018-01-01: qty 2

## 2018-01-01 MED ORDER — LACTATED RINGERS IV SOLN
Freq: Once | INTRAVENOUS | Status: AC
  Administered 2018-01-01: 07:00:00 via INTRAVENOUS

## 2018-01-01 NOTE — Progress Notes (Signed)
Patient cocaine positive, case cancelled

## 2018-01-01 NOTE — Progress Notes (Signed)
The case has been cancelled by Dr. Kalman Shan and I due to the patient's toxicology report coming back positive for cocaine, which the patient freely admits to using.  Will arrange outpatient follow-up.   Ellison Hughs, MD [facility]

## 2018-01-01 NOTE — Progress Notes (Signed)
Case cancelled per Dr. Kalman Shan, Dr.Rose in talking with patient, patient positive for cocaine. Dr. Lovena Neighbours in talking with patient, office to reschedule patient.

## 2018-01-17 ENCOUNTER — Other Ambulatory Visit: Payer: Self-pay | Admitting: Urology

## 2018-02-14 DIAGNOSIS — N400 Enlarged prostate without lower urinary tract symptoms: Secondary | ICD-10-CM | POA: Insufficient documentation

## 2018-03-13 NOTE — Progress Notes (Signed)
12-27-17 (Epic) Bifascular block  11-01-17 (Epic) LOV w/Vascular f/u 12 months  10-11-17 (Epic) CT abd/pelvis

## 2018-03-13 NOTE — Patient Instructions (Addendum)
Eric Barnett  03/13/2018   Your procedure is scheduled on: 03-19-18     Report to [facility] Main  Entrance    Report to Admitting at 10:30 AM    Call this number if you have problems the morning of surgery [phone]     Remember: Do not eat food or drink liquids :After Midnight.     BRUSH YOUR TEETH MORNING OF SURGERY AND RINSE YOUR MOUTH OUT, NO CHEWING GUM CANDY OR MINTS.     Take these medicines the morning of surgery with A SIP OF WATER: None                               You may not have any metal on your body including hair pins and              piercings  Do not wear jewelry, lotions, powders, cologne or deodorant             Men may shave face and neck.   Do not bring valuables to the hospital. [place].  Contacts, dentures or bridgework may not be worn into surgery.  Leave suitcase in the car. After surgery it may be brought to your room.     Special Instructions: N/A              Please read over the following fact sheets you were given: _____________________________________________________________________      [facility] - Preparing for Surgery Before surgery, you can play an important role.  Because skin is not sterile, your skin needs to be as free of germs as possible.  You can reduce the number of germs on your skin by washing with CHG (chlorahexidine gluconate) soap before surgery.  CHG is an antiseptic cleaner which kills germs and bonds with the skin to continue killing germs even after washing. Please DO NOT use if you have an allergy to CHG or antibacterial soaps.  If your skin becomes reddened/irritated stop using the CHG and inform your nurse when you arrive at Short Stay. Do not shave (including legs and underarms) for at least 48 hours prior to the first CHG shower.  You may shave your face/neck. Please follow these instructions carefully:  1.  Shower with CHG Soap the  night before surgery and the  morning of Surgery.  2.  If you choose to wash your hair, wash your hair first as usual with your  normal  shampoo.  3.  After you shampoo, rinse your hair and body thoroughly to remove the  shampoo.                           4.  Use CHG as you would any other liquid soap.  You can apply chg directly  to the skin and wash                       Gently with a scrungie or clean washcloth.  5.  Apply the CHG Soap to your body ONLY FROM THE NECK DOWN.   Do not use on face/ open  Wound or open sores. Avoid contact with eyes, ears mouth and genitals (private parts).                       Wash face,  Genitals (private parts) with your normal soap.             6.  Wash thoroughly, paying special attention to the area where your surgery  will be performed.  7.  Thoroughly rinse your body with warm water from the neck down.  8.  DO NOT shower/wash with your normal soap after using and rinsing off  the CHG Soap.                9.  Pat yourself dry with a clean towel.            10.  Wear clean pajamas.            11.  Place clean sheets on your bed the night of your first shower and do not  sleep with pets. Day of Surgery : Do not apply any lotions/deodorants the morning of surgery.  Please wear clean clothes to the hospital/surgery center.  FAILURE TO FOLLOW THESE INSTRUCTIONS MAY RESULT IN THE CANCELLATION OF YOUR SURGERY PATIENT SIGNATURE_________________________________  NURSE SIGNATURE__________________________________  ________________________________________________________________________

## 2018-03-14 ENCOUNTER — Telehealth: Payer: Self-pay | Admitting: *Deleted

## 2018-03-14 ENCOUNTER — Other Ambulatory Visit: Payer: Self-pay

## 2018-03-14 ENCOUNTER — Encounter (HOSPITAL_COMMUNITY)
Admission: RE | Admit: 2018-03-14 | Discharge: 2018-03-14 | Disposition: A | Discharge status: Home / Self Care | Payer: Medicare HMO | Source: Ambulatory Visit | Attending: Urology | Admitting: Urology

## 2018-03-14 ENCOUNTER — Encounter (HOSPITAL_COMMUNITY): Payer: Self-pay

## 2018-03-14 DIAGNOSIS — Z01812 Encounter for preprocedural laboratory examination: Secondary | ICD-10-CM | POA: Insufficient documentation

## 2018-03-14 LAB — BASIC METABOLIC PANEL
Anion gap: 8 (ref 5–15)
BUN: 18 mg/dL (ref 8–23)
CALCIUM: 9.7 mg/dL (ref 8.9–10.3)
CO2: 30 mmol/L (ref 22–32)
CREATININE: 1.42 mg/dL — AB (ref 0.61–1.24)
Chloride: 103 mmol/L (ref 98–111)
GFR, EST AFRICAN AMERICAN: 58 mL/min — AB (ref >60)
GFR, EST NON AFRICAN AMERICAN: 50 mL/min — AB (ref >60)
Glucose, Bld: 103 mg/dL — ABNORMAL HIGH (ref 70–99)
Potassium: 3.5 mmol/L (ref 3.5–5.1)
SODIUM: 141 mmol/L (ref 135–145)

## 2018-03-14 LAB — CBC
HEMATOCRIT: 41.1 % (ref 39.0–52.0)
Hemoglobin: 13.3 g/dL (ref 13.0–17.0)
MCH: 31.1 pg (ref 26.0–34.0)
MCHC: 32.4 g/dL (ref 30.0–36.0)
MCV: 96.3 fL (ref 80.0–100.0)
NRBC: 0 % (ref 0.0–0.2)
Platelets: 161 10*3/uL (ref 150–400)
RBC: 4.27 MIL/uL (ref 4.22–5.81)
RDW: 12.6 % (ref 11.5–15.5)
WBC: 3.9 10*3/uL — AB (ref 4.0–10.5)

## 2018-03-14 NOTE — Telephone Encounter (Signed)
   [facility] Pre-operative Risk Assessment    Request for surgical clearance:  1. What type of surgery is being performed?  Left Robotic Adrenalectomy    2. When is this surgery scheduled?  TBD   3. What type of clearance is required (medical clearance vs. Pharmacy clearance to hold med vs. Both)?  Medical  4. Are there any medications that need to be held prior to surgery and how long? N/A  5. Practice name and name of physician performing surgery?  [facility] Dr. Lovena Neighbours   6. What is your office phone number [phone]    7.   What is your office fax number  [phone]  8.   Anesthesia type (None, local, MAC, general) ?     Pt has appt 10/23 w/ Corene Cornea, Lazariah Savard L 03/14/2018, 3:47 PM  _________________________________________________________________   (provider comments below)

## 2018-03-14 NOTE — Progress Notes (Signed)
03-14-18  EKG history reviewed with Dr. Roanna Banning, Anesthesiologist. Pt now has Bifascicular block. Per Dr. Roanna Banning, cardiac clearance is required. Left message for [facility] regarding the above. Awaiting return call...  Beola Cord returned call. She is now aware, and relay information to Dr. Dema Severin.Marland Kitchen

## 2018-03-18 NOTE — Telephone Encounter (Signed)
   Primary Cardiologist:Jonathan Gwenlyn Found, MD 2016  Chart reviewed as part of pre-operative protocol coverage. Because of Eric Barnett past medical history and time since last visit, he/she will require a follow-up visit in order to better assess preoperative cardiovascular risk.  Pre-op covering staff: - Please schedule appointment and call patient to inform them. - Please contact requesting surgeon's office via preferred method (i.e, phone, fax) to inform them of need for appointment prior to surgery.  Rosaria Ferries, PA-C  03/18/2018, 5:17 PM

## 2018-03-19 ENCOUNTER — Telehealth: Payer: Self-pay | Admitting: Cardiovascular Disease

## 2018-03-19 ENCOUNTER — Ambulatory Visit (INDEPENDENT_AMBULATORY_CARE_PROVIDER_SITE_OTHER): Payer: 59 | Admitting: Cardiovascular Disease

## 2018-03-19 ENCOUNTER — Encounter: Payer: Self-pay | Admitting: Cardiovascular Disease

## 2018-03-19 VITALS — BP 120/86 | HR 71 | Ht 75.0 in | Wt 176.0 lb

## 2018-03-19 DIAGNOSIS — Z01810 Encounter for preprocedural cardiovascular examination: Secondary | ICD-10-CM

## 2018-03-19 LAB — TYPE AND SCREEN
ABO/RH(D): O POS
ANTIBODY SCREEN: NEGATIVE

## 2018-03-19 NOTE — Telephone Encounter (Signed)
Pt had an appt today with Dr Angelena Form for surgical clearance

## 2018-03-19 NOTE — Patient Instructions (Addendum)
Medication Instructions:  Your physician recommends that you continue on your current medications as directed. Please refer to the Current Medication list given to you today.  If you need a refill on your cardiac medications before your next appointment, please call your pharmacy.   Lab work: none If you have labs (blood work) drawn today and your tests are completely normal, you will receive your results only by: Marland Kitchen MyChart Message (if you have MyChart) OR . A paper copy in the mail If you have any lab test that is abnormal or we need to change your treatment, we will call you to review the results.  Testing/Procedures: none  Follow-Up: Your physician recommends that you schedule a follow-up appointment as needed with Dr. Gwenlyn Found.    Any Other Special Instructions Will Be Listed Below (If Applicable).

## 2018-03-19 NOTE — Progress Notes (Signed)
Chief Complaint  Patient presents with  . New Patient (Initial Visit)   History of Present Illness: 68 yo male with history of tobacco abuse, AAA, hyperlipidemia, PACs, PVCs and HTN here today as a new patient for pre-operative risk assessment prior to planned adrenal mass removal. He has been followed by Dr. Gwenlyn Found for his arrhythmias. Event monitor in 2015 with PVCs and PACs. Last seen by Dr. Gwenlyn Found in 2016. Stress myoview in 2016 was low risk. He has undergone placement of an aortic endograft in April 2016 for treatment of his AAA. He has a left adrenal mass and is scheduled for resection. This was postponed in August 2019 due to positive UDS for cocaine.   He tells me that he feels great. He has no chest pain, dyspnea, lower extremity edema. He denies dizziness, near syncope or syncope. He is very active. He admits to the occasional use of cocaine, none in over a month. He smokes 2-3 cigarettes per day.   Primary Care Physician: Patient, No Pcp Per  Primary Cardiologist: Dr. Gwenlyn Found   Past Medical History:  Diagnosis Date  . AAA (abdominal aortic aneurysm) ([place])   . Hyperchloremia   . Hyperlipidemia   . Hypertension   . Irregular heartbeat   . Vitamin D deficiency     Past Surgical History:  Procedure Laterality Date  . ABDOMINAL AORTIC ENDOVASCULAR STENT GRAFT N/A 08/30/2014   Procedure: ABDOMINAL AORTIC ENDOVASCULAR STENT GRAFT;  Surgeon: Elam Dutch, MD;  Location: [facility];  Service: Vascular;  Laterality: N/A;  . HERNIA REPAIR     bil groins " several time"  . ROBOTIC ADRENALECTOMY     Dr. Lovena Neighbours 01-01-18  . TONSILLECTOMY      Current Outpatient Medications  Medication Sig Dispense Refill  . atorvastatin (LIPITOR) 40 MG tablet Take 1 tablet (40 mg total) by mouth daily. 30 tablet 6  . losartan-hydrochlorothiazide (HYZAAR) 100-25 MG tablet Take 1 tablet by mouth every evening.    . tamsulosin (FLOMAX) 0.4 MG CAPS capsule Take 0.4 mg by mouth daily.     No current  facility-administered medications for this visit.     Allergies  Allergen Reactions  . Aspirin Nausea Only and Other (See Comments)    HIGH DOSE  Upset patients   stomach-     Social History   Socioeconomic History  . Marital status: Divorced    Spouse name: Not on file  . Number of children: 0  . Years of education: Not on file  . Highest education level: Not on file  Occupational History  . Occupation: Retired  Scientific laboratory technician  . Financial resource strain: Not on file  . Food insecurity:    Worry: Not on file    Inability: Not on file  . Transportation needs:    Medical: Not on file    Non-medical: Not on file  Tobacco Use  . Smoking status: Current Some Day Smoker    Packs/day: 0.25    Years: 17.00    Pack years: 4.25    Types: Cigarettes  . Smokeless tobacco: Former Systems developer    Types: Chew    Quit date: 01/20/1974  . Tobacco comment: 3-4 cigarettes per day  Substance and Sexual Activity  . Alcohol use: Not Currently    Alcohol/week: 1.0 standard drinks    Types: 1 Standard drinks or equivalent per week    Comment: 1/2 pint per week  . Drug use: Not Currently    Types: Marijuana, Cocaine  Comment: none in a long time per pt.  . Sexual activity: Yes  Lifestyle  . Physical activity:    Days per week: Not on file    Minutes per session: Not on file  . Stress: Not on file  Relationships  . Social connections:    Talks on phone: Not on file    Gets together: Not on file    Attends religious service: Not on file    Active member of club or organization: Not on file    Attends meetings of clubs or organizations: Not on file    Relationship status: Not on file  . Intimate partner violence:    Fear of current or ex partner: Not on file    Emotionally abused: Not on file    Physically abused: Not on file    Forced sexual activity: Not on file  Other Topics Concern  . Not on file  Social History Narrative  . Not on file    History reviewed. No pertinent family  history. He has no family history of CAD. He does not know of any medical problems in his family.   Review of Systems:  As stated in the HPI and otherwise negative.   BP 120/86   Pulse 71   Ht 6\' 3"  (1.905 m)   Wt 176 lb (79.8 kg)   SpO2 99%   BMI 22.00 kg/m   Physical Examination: General: Well developed, well nourished, NAD  HEENT: OP clear, mucus membranes moist  SKIN: warm, dry. No rashes. Neuro: No focal deficits  Musculoskeletal: Muscle strength 5/5 all ext  Psychiatric: Mood and affect normal  Neck: No JVD, no carotid bruits, no thyromegaly, no lymphadenopathy.  Lungs:Clear bilaterally, no wheezes, rhonci, crackles Cardiovascular: Regular rate and rhythm. No murmurs, gallops or rubs. Abdomen:Soft. Bowel sounds present. Non-tender.  Extremities: No lower extremity edema. Pulses are 2 + in the bilateral DP/PT.  EKG:  EKG is ordered today. The ekg ordered today demonstrates NSR, rate 71 bpm. RBBB. LAFB. T wave inversions in the inferior leads. Overall unchanged from old EKG.   Recent Labs: 03/14/2018: BUN 18; Creatinine, Ser 1.42; Hemoglobin 13.3; Platelets 161; Potassium 3.5; Sodium 141   Lipid Panel    Component Value Date/Time   CHOL 200 08/17/2014 1422   TRIG 119 08/17/2014 1422   HDL 63 08/17/2014 1422   CHOLHDL 3.2 08/17/2014 1422   VLDL 24 08/17/2014 1422   LDLCALC 113 (H) 08/17/2014 1422     Wt Readings from Last 3 Encounters:  03/19/18 176 lb (79.8 kg)  03/14/18 174 lb (78.9 kg)  01/01/18 169 lb (76.7 kg)     Other studies Reviewed: Additional studies/ records that were reviewed today include: . Review of the above records demonstrates:    Assessment and Plan:   1. Pre-operative cardiovascular risk assessment: He has no worrisome symptoms suggestive of angina, heart failure or arrhythmias. His EKG is grossly unchanged from 2016. His cardiac examination is normal. His BP is well controlled on current therapy. I think he can proceed with his planned  surgical procedure without further cardiac evaluation. We will be available for consultation in the hospital following his procedure if this is necessary.   Current medicines are reviewed at length with the patient today.  The patient does not have concerns regarding medicines.  The following changes have been made:  no change  Labs/ tests ordered today include:  No orders of the defined types were placed in this encounter.  Disposition:   FU will be as needed with Dr. Gwenlyn Found.    Signed, Lauree Chandler, MD 03/19/2018 1:12 PM    [facility] [place], [place], [place]  [address] Phone: [phone]; Fax: [phone]

## 2018-03-19 NOTE — Telephone Encounter (Signed)
Dr. Angelena Form routed office note from today to Dr. Lovena Neighbours.

## 2018-03-19 NOTE — Telephone Encounter (Signed)
I spoke with pt. He reports he thought he had a new call but realized it was an appointment reminder for today.

## 2018-03-19 NOTE — Telephone Encounter (Signed)
New Message ° ° ° ° ° ° ° ° ° °Patient returned your call, would like a call back. °

## 2018-03-20 NOTE — Progress Notes (Signed)
Patient has received cardiac clearance for surgery on 03/19/2018 with Dr Angelena Form.  Patient called with new date and time of surgery for 03/26/2018.  Patient has hibiclens and instructions from original preop appt.  Patient to follow same instructions and voiced understanding.

## 2018-03-24 NOTE — Addendum Note (Signed)
Addended by: Mendel Ryder on: 03/24/2018 07:58 AM   Modules accepted: Orders

## 2018-03-25 NOTE — H&P (Signed)
Urology Preoperative H&P   Chief Complaint: Left adrenal mass  History of Present Illness: Eric Barnett is a 67 y.o. male referred by Dr. 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 He also has a history of a AAA and is s/p EVAR in April 2016.   The adrenal mass was first noted on CT angio abd/pel from 07/2014 and measured 6.7 cm x 4.8 cm at that time. Repeat CT abd/pel (10/11/17) showed interval growth of the left adrenal mass, now measuring 5.5 cm x 7.5 cm. The mass also showed enhancement with minimal washout on delayed imaging.   LABS 10/28/2017  Plasma metanephrines-52 (WNR)  Aldosterone-2 (WNR)  Cortisol-0.38 (WNR)  Aldosterone:renin- 0.6 (WNR)  From a urinary standpoint, he reports resolution of his gross hematuria. He was started on tamsulosin at his last office visit and reports some improvement in his force of stream and denies less urgency/frequency. Denies interval UTIs, dysuria or gross hematuria.    Past Medical History:  Diagnosis Date  . AAA (abdominal aortic aneurysm) ([place])   . Hyperchloremia   . Hyperlipidemia   . Hypertension   . Irregular heartbeat   . Vitamin D deficiency     Past Surgical History:  Procedure Laterality Date  . ABDOMINAL AORTIC ENDOVASCULAR STENT GRAFT N/A 08/30/2014   Procedure: ABDOMINAL AORTIC ENDOVASCULAR STENT GRAFT;  Surgeon: Elam Dutch, MD;  Location: [facility];  Service: Vascular;  Laterality: N/A;  . HERNIA REPAIR     bil groins " several time"  . ROBOTIC ADRENALECTOMY     Dr. Lovena Neighbours 01-01-18  . TONSILLECTOMY      Allergies:  Allergies  Allergen Reactions  . Aspirin Nausea Only and Other (See Comments)    HIGH DOSE  Upset patients   stomach-     No family history on file.  Social History:  reports that he has been smoking cigarettes. He has a 4.25 pack-year smoking history. He quit smokeless tobacco use about 44 years ago.  His smokeless tobacco use included chew. He reports that he drank about 1.0 standard  drinks of alcohol per week. He reports that he has current or past drug history. Drugs: Marijuana and Cocaine.  ROS: A complete review of systems was performed.  All systems are negative except for pertinent findings as noted.  Physical Exam:  Vital signs in last 24 hours:   Constitutional:  Alert and oriented, No acute distress Cardiovascular: Regular rate and rhythm, No JVD Respiratory: Normal respiratory effort, Lungs clear bilaterally GI: Abdomen is soft, nontender, nondistended, no abdominal masses GU: No CVA tenderness Lymphatic: No lymphadenopathy Neurologic: Grossly intact, no focal deficits Psychiatric: Normal mood and affect  Laboratory Data:  No results for input(s): WBC, HGB, HCT, PLT in the last 72 hours.  No results for input(s): NA, K, CL, GLUCOSE, BUN, CALCIUM, CREATININE in the last 72 hours.  Invalid input(s): CO3   No results found for this or any previous visit (from the past 24 hour(s)). No results found for this or any previous visit (from the past 240 hour(s)).  Renal Function: No results for input(s): CREATININE in the last 168 hours. Estimated Creatinine Clearance: 57 mL/min (A) (by C-G formula based on SCr of 1.42 mg/dL (H)).  Radiologic Imaging: No results found.  I independently reviewed the above imaging studies.  Assessment and Plan Eric Barnett is a 67 y.o. male with a left adrenal mass  -The risks, benefits and alternatives of robotic-assisted laparoscopic left adrenalectomy was discussed  with the patient. Risks include, but are not limited to: Bleeding complications, infection, positive surgical margin, benign pathology, open conversion, left renal loss, adjacent organ injury, MI, stroke, PE, DVT inherent risk of general anesthesia. He voices understanding and wishes to proceed.    Ellison Hughs, MD 03/25/2018, 1:37 PM  [facility] Pager: [phone]

## 2018-03-26 ENCOUNTER — Encounter (HOSPITAL_COMMUNITY)
Admission: RE | Disposition: A | Discharge status: Home / Self Care | Payer: Self-pay | Source: Ambulatory Visit | Attending: Urology

## 2018-03-26 ENCOUNTER — Encounter (HOSPITAL_COMMUNITY): Payer: Self-pay

## 2018-03-26 ENCOUNTER — Inpatient Hospital Stay (HOSPITAL_COMMUNITY)
Admission: RE | Admit: 2018-03-26 | Discharge: 2018-03-28 | DRG: 328 | Disposition: A | Discharge status: Home / Self Care | Payer: Medicare HMO | Source: Ambulatory Visit | Attending: Urology | Admitting: Urology

## 2018-03-26 ENCOUNTER — Inpatient Hospital Stay (HOSPITAL_COMMUNITY): Payer: Medicare HMO | Admitting: Anesthesiology

## 2018-03-26 ENCOUNTER — Encounter: Payer: Self-pay | Admitting: Surgery

## 2018-03-26 DIAGNOSIS — Z886 Allergy status to analgesic agent status: Secondary | ICD-10-CM | POA: Diagnosis not present

## 2018-03-26 DIAGNOSIS — R319 Hematuria, unspecified: Secondary | ICD-10-CM | POA: Diagnosis present

## 2018-03-26 DIAGNOSIS — I10 Essential (primary) hypertension: Secondary | ICD-10-CM | POA: Diagnosis present

## 2018-03-26 DIAGNOSIS — D490 Neoplasm of unspecified behavior of digestive system: Secondary | ICD-10-CM | POA: Diagnosis present

## 2018-03-26 DIAGNOSIS — C49A2 Gastrointestinal stromal tumor of stomach: Secondary | ICD-10-CM

## 2018-03-26 DIAGNOSIS — F1411 Cocaine abuse, in remission: Secondary | ICD-10-CM | POA: Diagnosis present

## 2018-03-26 DIAGNOSIS — F17210 Nicotine dependence, cigarettes, uncomplicated: Secondary | ICD-10-CM | POA: Diagnosis present

## 2018-03-26 DIAGNOSIS — E785 Hyperlipidemia, unspecified: Secondary | ICD-10-CM | POA: Diagnosis present

## 2018-03-26 DIAGNOSIS — Z9089 Acquired absence of other organs: Secondary | ICD-10-CM | POA: Diagnosis not present

## 2018-03-26 DIAGNOSIS — Z95828 Presence of other vascular implants and grafts: Secondary | ICD-10-CM

## 2018-03-26 DIAGNOSIS — F17200 Nicotine dependence, unspecified, uncomplicated: Secondary | ICD-10-CM | POA: Diagnosis present

## 2018-03-26 HISTORY — PX: ROBOTIC ADRENALECTOMY: SHX6407

## 2018-03-26 LAB — TYPE AND SCREEN
ABO/RH(D): O POS
Antibody Screen: NEGATIVE

## 2018-03-26 LAB — HEMOGLOBIN AND HEMATOCRIT, BLOOD
HCT: 38.2 % — ABNORMAL LOW (ref 39.0–52.0)
Hemoglobin: 12.5 g/dL — ABNORMAL LOW (ref 13.0–17.0)

## 2018-03-26 SURGERY — ADRENALECTOMY, ROBOT-ASSISTED
Anesthesia: General | Site: Abdomen | Laterality: Left

## 2018-03-26 MED ORDER — CEFAZOLIN SODIUM-DEXTROSE 2-4 GM/100ML-% IV SOLN
2.0000 g | Freq: Once | INTRAVENOUS | Status: AC
  Administered 2018-03-26: 2 g via INTRAVENOUS

## 2018-03-26 MED ORDER — FENTANYL CITRATE (PF) 100 MCG/2ML IJ SOLN
25.0000 ug | INTRAMUSCULAR | Status: DC | PRN
  Administered 2018-03-26 (×2): 50 ug via INTRAVENOUS

## 2018-03-26 MED ORDER — METOPROLOL TARTRATE 5 MG/5ML IV SOLN: 5.0000 mg | Freq: Four times a day (QID) | INTRAVENOUS | Status: DC | PRN

## 2018-03-26 MED ORDER — ACETAMINOPHEN 10 MG/ML IV SOLN
1000.0000 mg | Freq: Four times a day (QID) | INTRAVENOUS | Status: AC
  Administered 2018-03-26 – 2018-03-27 (×4): 1000 mg via INTRAVENOUS
  Filled 2018-03-26 (×4): qty 100

## 2018-03-26 MED ORDER — METHOCARBAMOL 1000 MG/10ML IJ SOLN
1000.0000 mg | Freq: Four times a day (QID) | INTRAVENOUS | Status: DC | PRN
  Filled 2018-03-26: qty 10

## 2018-03-26 MED ORDER — DEXAMETHASONE SODIUM PHOSPHATE 10 MG/ML IJ SOLN
INTRAMUSCULAR | Status: AC
  Filled 2018-03-26: qty 1

## 2018-03-26 MED ORDER — ONDANSETRON HCL 4 MG/2ML IJ SOLN: 4.0000 mg | INTRAMUSCULAR | Status: DC | PRN

## 2018-03-26 MED ORDER — ROCURONIUM BROMIDE 100 MG/10ML IV SOLN
INTRAVENOUS | Status: DC | PRN
  Administered 2018-03-26: 50 mg via INTRAVENOUS
  Administered 2018-03-26: 10 mg via INTRAVENOUS
  Administered 2018-03-26: 15 mg via INTRAVENOUS
  Administered 2018-03-26: 5 mg via INTRAVENOUS

## 2018-03-26 MED ORDER — ACETAMINOPHEN 160 MG/5ML PO SOLN: 1000.0000 mg | Freq: Once | ORAL | Status: DC | PRN

## 2018-03-26 MED ORDER — LACTATED RINGERS IV SOLN
INTRAVENOUS | Status: DC
  Administered 2018-03-26 (×2): via INTRAVENOUS

## 2018-03-26 MED ORDER — BISACODYL 10 MG RE SUPP: 10.0000 mg | Freq: Two times a day (BID) | RECTAL | Status: DC | PRN

## 2018-03-26 MED ORDER — ONDANSETRON HCL 4 MG/2ML IJ SOLN: 4.0000 mg | Freq: Four times a day (QID) | INTRAMUSCULAR | Status: DC | PRN

## 2018-03-26 MED ORDER — GUAIFENESIN-DM 100-10 MG/5ML PO SYRP: 10.0000 mL | ORAL_SOLUTION | ORAL | Status: DC | PRN

## 2018-03-26 MED ORDER — ACETAMINOPHEN 10 MG/ML IV SOLN: 1000.0000 mg | Freq: Once | INTRAVENOUS | Status: DC | PRN

## 2018-03-26 MED ORDER — OXYCODONE HCL 5 MG PO TABS: 5.0000 mg | ORAL_TABLET | Freq: Once | ORAL | Status: DC | PRN

## 2018-03-26 MED ORDER — DIPHENHYDRAMINE HCL 50 MG/ML IJ SOLN: 12.5000 mg | Freq: Four times a day (QID) | INTRAMUSCULAR | Status: DC | PRN

## 2018-03-26 MED ORDER — PHENOL 1.4 % MT LIQD: 1.0000 | OROMUCOSAL | Status: DC | PRN

## 2018-03-26 MED ORDER — DOCUSATE SODIUM 100 MG PO CAPS: 100.0000 mg | ORAL_CAPSULE | Freq: Two times a day (BID) | ORAL | Status: DC

## 2018-03-26 MED ORDER — MAGIC MOUTHWASH
15.0000 mL | Freq: Four times a day (QID) | ORAL | Status: DC | PRN
  Filled 2018-03-26: qty 15

## 2018-03-26 MED ORDER — SUGAMMADEX SODIUM 200 MG/2ML IV SOLN
INTRAVENOUS | Status: AC
  Filled 2018-03-26: qty 2

## 2018-03-26 MED ORDER — ACETAMINOPHEN 500 MG PO TABS: 1000.0000 mg | ORAL_TABLET | Freq: Once | ORAL | Status: DC | PRN

## 2018-03-26 MED ORDER — LIP MEDEX EX OINT: 1.0000 "application " | TOPICAL_OINTMENT | Freq: Two times a day (BID) | CUTANEOUS | Status: DC

## 2018-03-26 MED ORDER — BUPIVACAINE HCL (PF) 0.25 % IJ SOLN
INTRAMUSCULAR | Status: DC | PRN
  Administered 2018-03-26: 25 mL

## 2018-03-26 MED ORDER — FENTANYL CITRATE (PF) 100 MCG/2ML IJ SOLN
INTRAMUSCULAR | Status: AC
  Filled 2018-03-26: qty 4

## 2018-03-26 MED ORDER — SUGAMMADEX SODIUM 200 MG/2ML IV SOLN
INTRAVENOUS | Status: DC | PRN
  Administered 2018-03-26: 200 mg via INTRAVENOUS

## 2018-03-26 MED ORDER — GLYCOPYRROLATE PF 0.2 MG/ML IJ SOSY
PREFILLED_SYRINGE | INTRAMUSCULAR | Status: AC
  Filled 2018-03-26: qty 2

## 2018-03-26 MED ORDER — LACTATED RINGERS IV SOLN
INTRAVENOUS | Status: DC
  Administered 2018-03-26 (×2): 1000 mL via INTRAVENOUS

## 2018-03-26 MED ORDER — PROPOFOL 10 MG/ML IV BOLUS
INTRAVENOUS | Status: DC | PRN
  Administered 2018-03-26: 20 mg via INTRAVENOUS
  Administered 2018-03-26: 40 mg via INTRAVENOUS
  Administered 2018-03-26: 140 mg via INTRAVENOUS
  Administered 2018-03-26 (×2): 40 mg via INTRAVENOUS

## 2018-03-26 MED ORDER — LIDOCAINE 2% (20 MG/ML) 5 ML SYRINGE
INTRAMUSCULAR | Status: AC
  Filled 2018-03-26: qty 5

## 2018-03-26 MED ORDER — METOCLOPRAMIDE HCL 5 MG/ML IJ SOLN: 5.0000 mg | Freq: Three times a day (TID) | INTRAMUSCULAR | Status: DC | PRN

## 2018-03-26 MED ORDER — ONDANSETRON HCL 4 MG/2ML IJ SOLN
INTRAMUSCULAR | Status: AC
  Filled 2018-03-26: qty 2

## 2018-03-26 MED ORDER — SODIUM CHLORIDE 0.9 % IV SOLN
INTRAVENOUS | Status: DC | PRN
  Administered 2018-03-26: 10 ug/min via INTRAVENOUS

## 2018-03-26 MED ORDER — ROCURONIUM BROMIDE 10 MG/ML (PF) SYRINGE
PREFILLED_SYRINGE | INTRAVENOUS | Status: AC
  Filled 2018-03-26: qty 10

## 2018-03-26 MED ORDER — ROCURONIUM BROMIDE 100 MG/10ML IV SOLN: INTRAVENOUS | Status: DC | PRN

## 2018-03-26 MED ORDER — SODIUM CHLORIDE 0.9 % IJ SOLN
INTRAMUSCULAR | Status: AC
  Filled 2018-03-26: qty 50

## 2018-03-26 MED ORDER — HYDRALAZINE HCL 20 MG/ML IJ SOLN
5.0000 mg | INTRAMUSCULAR | Status: DC | PRN
  Filled 2018-03-26: qty 1

## 2018-03-26 MED ORDER — LORAZEPAM 2 MG/ML IJ SOLN: 0.5000 mg | Freq: Three times a day (TID) | INTRAMUSCULAR | Status: DC | PRN

## 2018-03-26 MED ORDER — ALUM & MAG HYDROXIDE-SIMETH 200-200-20 MG/5ML PO SUSP: 30.0000 mL | Freq: Four times a day (QID) | ORAL | Status: DC | PRN

## 2018-03-26 MED ORDER — LIDOCAINE HCL (CARDIAC) PF 100 MG/5ML IV SOSY
PREFILLED_SYRINGE | INTRAVENOUS | Status: DC | PRN
  Administered 2018-03-26: 60 mg via INTRAVENOUS

## 2018-03-26 MED ORDER — HYDROCODONE-ACETAMINOPHEN 5-325 MG PO TABS: 1.0000 | ORAL_TABLET | Freq: Four times a day (QID) | ORAL | 0 refills | Status: DC | PRN

## 2018-03-26 MED ORDER — LACTATED RINGERS IV BOLUS: 1000.0000 mL | Freq: Three times a day (TID) | INTRAVENOUS | Status: DC | PRN

## 2018-03-26 MED ORDER — HYDROMORPHONE HCL 1 MG/ML IJ SOLN
0.5000 mg | INTRAMUSCULAR | Status: DC | PRN
  Filled 2018-03-26: qty 1

## 2018-03-26 MED ORDER — KETAMINE HCL 10 MG/ML IJ SOLN
INTRAMUSCULAR | Status: DC | PRN
  Administered 2018-03-26: 20 mg via INTRAVENOUS

## 2018-03-26 MED ORDER — SODIUM CHLORIDE 0.9 % IV SOLN
8.0000 mg | Freq: Four times a day (QID) | INTRAVENOUS | Status: DC | PRN
  Filled 2018-03-26: qty 4

## 2018-03-26 MED ORDER — PROPOFOL 10 MG/ML IV BOLUS
INTRAVENOUS | Status: AC
  Filled 2018-03-26: qty 40

## 2018-03-26 MED ORDER — MIDAZOLAM HCL 2 MG/2ML IJ SOLN
INTRAMUSCULAR | Status: AC
  Filled 2018-03-26: qty 2

## 2018-03-26 MED ORDER — FENTANYL CITRATE (PF) 250 MCG/5ML IJ SOLN
INTRAMUSCULAR | Status: AC
  Filled 2018-03-26: qty 5

## 2018-03-26 MED ORDER — DIPHENHYDRAMINE HCL 12.5 MG/5ML PO ELIX: 12.5000 mg | ORAL_SOLUTION | Freq: Four times a day (QID) | ORAL | Status: DC | PRN

## 2018-03-26 MED ORDER — MIDAZOLAM HCL 5 MG/5ML IJ SOLN
INTRAMUSCULAR | Status: DC | PRN
  Administered 2018-03-26 (×2): 1 mg via INTRAVENOUS

## 2018-03-26 MED ORDER — PHENYLEPHRINE 40 MCG/ML (10ML) SYRINGE FOR IV PUSH (FOR BLOOD PRESSURE SUPPORT)
PREFILLED_SYRINGE | INTRAVENOUS | Status: AC
  Filled 2018-03-26: qty 10

## 2018-03-26 MED ORDER — PANTOPRAZOLE SODIUM 40 MG PO TBEC
40.0000 mg | DELAYED_RELEASE_TABLET | Freq: Two times a day (BID) | ORAL | Status: DC
  Administered 2018-03-27 (×2): 40 mg via ORAL
  Filled 2018-03-26 (×2): qty 1

## 2018-03-26 MED ORDER — PROCHLORPERAZINE EDISYLATE 10 MG/2ML IJ SOLN: 5.0000 mg | INTRAMUSCULAR | Status: DC | PRN

## 2018-03-26 MED ORDER — HYDROCORTISONE 2.5 % RE CREA: 1.0000 "application " | TOPICAL_CREAM | Freq: Four times a day (QID) | RECTAL | Status: DC | PRN

## 2018-03-26 MED ORDER — PROMETHAZINE HCL 12.5 MG PO TABS: 12.5000 mg | ORAL_TABLET | ORAL | 0 refills | Status: DC | PRN

## 2018-03-26 MED ORDER — KETAMINE HCL 10 MG/ML IJ SOLN
INTRAMUSCULAR | Status: AC
  Filled 2018-03-26: qty 1

## 2018-03-26 MED ORDER — ACETAMINOPHEN 650 MG RE SUPP
650.0000 mg | Freq: Four times a day (QID) | RECTAL | Status: DC | PRN
  Filled 2018-03-26: qty 1

## 2018-03-26 MED ORDER — BUPIVACAINE LIPOSOME 1.3 % IJ SUSP
20.0000 mL | Freq: Once | INTRAMUSCULAR | Status: AC
  Administered 2018-03-26: 15 mL
  Filled 2018-03-26: qty 20

## 2018-03-26 MED ORDER — DEXTROSE-NACL 5-0.45 % IV SOLN
INTRAVENOUS | Status: DC
  Administered 2018-03-26 – 2018-03-28 (×3): via INTRAVENOUS

## 2018-03-26 MED ORDER — MENTHOL 3 MG MT LOZG: 1.0000 | LOZENGE | OROMUCOSAL | Status: DC | PRN

## 2018-03-26 MED ORDER — ALBUMIN HUMAN 5 % IV SOLN
INTRAVENOUS | Status: DC | PRN
  Administered 2018-03-26: 10:00:00 via INTRAVENOUS

## 2018-03-26 MED ORDER — BUPIVACAINE HCL (PF) 0.25 % IJ SOLN
INTRAMUSCULAR | Status: AC
  Filled 2018-03-26: qty 30

## 2018-03-26 MED ORDER — FENTANYL CITRATE (PF) 100 MCG/2ML IJ SOLN
INTRAMUSCULAR | Status: DC | PRN
  Administered 2018-03-26 (×3): 50 ug via INTRAVENOUS
  Administered 2018-03-26: 100 ug via INTRAVENOUS

## 2018-03-26 MED ORDER — OXYCODONE HCL 5 MG/5ML PO SOLN
5.0000 mg | Freq: Once | ORAL | Status: DC | PRN
  Filled 2018-03-26: qty 5

## 2018-03-26 MED ORDER — HYDROCORTISONE 1 % EX CREA: 1.0000 "application " | TOPICAL_CREAM | Freq: Three times a day (TID) | CUTANEOUS | Status: DC | PRN

## 2018-03-26 MED ORDER — ONDANSETRON HCL 4 MG/2ML IJ SOLN
INTRAMUSCULAR | Status: DC | PRN
  Administered 2018-03-26: 4 mg via INTRAVENOUS

## 2018-03-26 MED ORDER — LABETALOL HCL 5 MG/ML IV SOLN
INTRAVENOUS | Status: DC | PRN
  Administered 2018-03-26: 2.5 mg via INTRAVENOUS

## 2018-03-26 SURGICAL SUPPLY — 74 items
ADH SKN CLS APL DERMABOND .7 (GAUZE/BANDAGES/DRESSINGS) ×1
AGENT HMST KT MTR STRL THRMB (HEMOSTASIS)
APL ESCP 34 STRL LF DISP (HEMOSTASIS)
APPLICATOR SURGIFLO ENDO (HEMOSTASIS) IMPLANT
BAG LAPAROSCOPIC 12 15 PORT 16 (BASKET) ×1 IMPLANT
BAG RETRIEVAL 12/15 (BASKET) ×2
BAG RETRIEVAL 12/15MM (BASKET) ×1
BAG SPEC RTRVL LRG 6X4 10 (ENDOMECHANICALS) ×1
CANNULA REDUC XI 12-8 STAPL (CANNULA) ×1
CANNULA REDUC XI 12-8MM STAPL (CANNULA) ×1
CANNULA REDUCER 12-8 DVNC XI (CANNULA) IMPLANT
CHLORAPREP W/TINT 26ML (MISCELLANEOUS) ×3 IMPLANT
CLIP VESOLOCK LG 6/CT PURPLE (CLIP) ×3 IMPLANT
CLIP VESOLOCK MED LG 6/CT (CLIP) ×3 IMPLANT
CLIP VESOLOCK XL 6/CT (CLIP) ×3 IMPLANT
COVER SURGICAL LIGHT HANDLE (MISCELLANEOUS) ×3 IMPLANT
COVER TIP SHEARS 8 DVNC (MISCELLANEOUS) ×1 IMPLANT
COVER TIP SHEARS 8MM DA VINCI (MISCELLANEOUS) ×2
COVER WAND RF STERILE (DRAPES) ×2 IMPLANT
DECANTER SPIKE VIAL GLASS SM (MISCELLANEOUS) ×3 IMPLANT
DERMABOND ADVANCED (GAUZE/BANDAGES/DRESSINGS) ×2
DERMABOND ADVANCED .7 DNX12 (GAUZE/BANDAGES/DRESSINGS) IMPLANT
DRAIN CHANNEL 15F RND FF 3/16 (WOUND CARE) IMPLANT
DRAPE ARM DVNC X/XI (DISPOSABLE) ×4 IMPLANT
DRAPE COLUMN DVNC XI (DISPOSABLE) ×1 IMPLANT
DRAPE DA VINCI XI ARM (DISPOSABLE) ×8
DRAPE DA VINCI XI COLUMN (DISPOSABLE) ×2
DRAPE INCISE IOBAN 66X45 STRL (DRAPES) ×3 IMPLANT
DRAPE LAPAROSCOPIC ABDOMINAL (DRAPES) ×1 IMPLANT
DRAPE SHEET LG 3/4 BI-LAMINATE (DRAPES) ×3 IMPLANT
ELECT PENCIL ROCKER SW 15FT (MISCELLANEOUS) ×3 IMPLANT
ELECT REM PT RETURN 15FT ADLT (MISCELLANEOUS) ×4 IMPLANT
EVACUATOR SILICONE 100CC (DRAIN) IMPLANT
GLOVE BIOGEL M STRL SZ7.5 (GLOVE) ×6 IMPLANT
GOWN STRL REUS W/TWL LRG LVL3 (GOWN DISPOSABLE) ×6 IMPLANT
GOWN STRL REUS W/TWL XL LVL3 (GOWN DISPOSABLE) ×3 IMPLANT
HOLDER FOLEY CATH W/STRAP (MISCELLANEOUS) ×2 IMPLANT
IRRIG SUCT STRYKERFLOW 2 WTIP (MISCELLANEOUS) ×3
IRRIGATION SUCT STRKRFLW 2 WTP (MISCELLANEOUS) ×1 IMPLANT
KIT BASIN OR (CUSTOM PROCEDURE TRAY) ×3 IMPLANT
NDL INSUFFLATION 14GA 120MM (NEEDLE) ×1 IMPLANT
NEEDLE INSUFFLATION 14GA 120MM (NEEDLE) ×3 IMPLANT
PORT ACCESS TROCAR AIRSEAL 12 (TROCAR) ×1 IMPLANT
PORT ACCESS TROCAR AIRSEAL 5M (TROCAR) ×2
POSITIONER SURGICAL ARM (MISCELLANEOUS) ×6 IMPLANT
POUCH SPECIMEN RETRIEVAL 10MM (ENDOMECHANICALS) ×2 IMPLANT
RELOAD STAPLE 45 GRN THCK DVNC (STAPLE) IMPLANT
RELOAD STAPLE 60 2.6 WHT THN (STAPLE) IMPLANT
RELOAD STAPLER WHITE 60MM (STAPLE) IMPLANT
SEAL CANN UNIV 5-8 DVNC XI (MISCELLANEOUS) ×3 IMPLANT
SEAL XI 5MM-8MM UNIVERSAL (MISCELLANEOUS) ×8
SEALER ENDOWRIST ONE VESSEL (MISCELLANEOUS) IMPLANT
SEALER VESSEL DA VINCI XI (MISCELLANEOUS) ×2
SEALER VESSEL EXT DVNC XI (MISCELLANEOUS) IMPLANT
SET TRI-LUMEN FLTR TB AIRSEAL (TUBING) ×3 IMPLANT
SOLUTION ELECTROLUBE (MISCELLANEOUS) ×3 IMPLANT
STAPLE ECHEON FLEX 60 POW ENDO (STAPLE) IMPLANT
STAPLER 45 GREEN RELOAD XI (STAPLE) ×6
STAPLER 45 GRN RELOAD XI (STAPLE) ×3 IMPLANT
STAPLER RELOAD WHITE 60MM (STAPLE)
STAPLER SHEATH (SHEATH) ×2
STAPLER SHEATH ENDOWRIST DVNC (SHEATH) IMPLANT
STAPLER VISISTAT 35W (STAPLE) IMPLANT
SURGIFLO W/THROMBIN 8M KIT (HEMOSTASIS) IMPLANT
SUT ETHILON 3 0 PS 1 (SUTURE) IMPLANT
SUT MNCRL AB 4-0 PS2 18 (SUTURE) ×4 IMPLANT
SUT PDS AB 1 CT1 27 (SUTURE) ×2 IMPLANT
SUT PDS AB 1 TP1 54 (SUTURE) ×2 IMPLANT
SUT VICRYL 0 UR6 27IN ABS (SUTURE) ×6 IMPLANT
TOWEL OR NON WOVEN STRL DISP B (DISPOSABLE) ×4 IMPLANT
TRAY FOLEY MTR SLVR 16FR STAT (SET/KITS/TRAYS/PACK) ×3 IMPLANT
TRAY LAPAROSCOPIC (CUSTOM PROCEDURE TRAY) ×3 IMPLANT
TROCAR XCEL 12X100 BLDLESS (ENDOMECHANICALS) IMPLANT
WATER STERILE IRR 1000ML POUR (IV SOLUTION) ×3 IMPLANT

## 2018-03-26 NOTE — Anesthesia Preprocedure Evaluation (Signed)
Anesthesia Evaluation  Patient identified by MRN, date of birth, ID band Patient awake    Reviewed: Allergy & Precautions, NPO status , Patient's Chart, lab work & pertinent test results  History of Anesthesia Complications Negative for: history of anesthetic complications  Airway Mallampati: III  TM Distance: <3 FB Neck ROM: Full  Mouth opening: Limited Mouth Opening  Dental  (+) Poor Dentition, Loose, Chipped, Missing, Dental Advisory Given   Pulmonary Current Smoker,    breath sounds clear to auscultation       Cardiovascular hypertension, Pt. on medications (-) angina+ Peripheral Vascular Disease   Rhythm:Regular Rate:Normal     Neuro/Psych negative neurological ROS  negative psych ROS   GI/Hepatic negative GI ROS, (+)     substance abuse  alcohol use and cocaine use,   Endo/Other  negative endocrine ROS  Renal/GU Renal InsufficiencyRenal disease     Musculoskeletal  (+) Arthritis ,   Abdominal   Peds  Hematology negative hematology ROS (+)   Anesthesia Other Findings Pre op clearance 10/23  Reproductive/Obstetrics                            Anesthesia Physical Anesthesia Plan  ASA: III  Anesthesia Plan: General   Post-op Pain Management:    Induction: Intravenous  PONV Risk Score and Plan: 1 and Ondansetron and Dexamethasone  Airway Management Planned: Oral ETT  Additional Equipment: None  Intra-op Plan:   Post-operative Plan: Extubation in OR  Informed Consent: I have reviewed the patients History and Physical, chart, labs and discussed the procedure including the risks, benefits and alternatives for the proposed anesthesia with the patient or authorized representative who has indicated his/her understanding and acceptance.   Dental advisory given  Plan Discussed with: CRNA and Surgeon  Anesthesia Plan Comments:         Anesthesia Quick Evaluation

## 2018-03-26 NOTE — Transfer of Care (Signed)
Immediate Anesthesia Transfer of Care Note  Patient: Eric Barnett  Procedure(s) Performed: XI ROBOTIC PARTIAL  GASTRECTOMY (Left Abdomen)  Patient Location: PACU  Anesthesia Type:General  Level of Consciousness: drowsy  Airway & Oxygen Therapy: Patient Spontanous Breathing and Patient connected to face mask oxygen  Post-op Assessment: Report given to RN and Post -op Vital signs reviewed and stable  Post vital signs: Reviewed and stable  Last Vitals:  Vitals Value Taken Time  BP 148/95 03/26/2018 12:11 PM  Temp    Pulse 86 03/26/2018 12:14 PM  Resp 17 03/26/2018 12:14 PM  SpO2 100 % 03/26/2018 12:14 PM  Vitals shown include unvalidated device data.  Last Pain:  Vitals:   03/26/18 0623  TempSrc:   PainSc: 0-No pain      Patients Stated Pain Goal: 4 (85/92/92 4462)  Complications: No apparent anesthesia complications

## 2018-03-26 NOTE — Anesthesia Procedure Notes (Signed)
Arterial Line Insertion Start/End10/30/2019 9:06 AM, 03/26/2018 9:11 AM Performed by: Oleta Mouse, MD  Patient location: OR. Preanesthetic checklist: patient identified, IV checked, site marked, risks and benefits discussed, surgical consent, monitors and equipment checked, pre-op evaluation, timeout performed and anesthesia consent Left, radial was placed Catheter size: 20 G Hand hygiene performed  and maximum sterile barriers used   Attempts: 1 Procedure performed without using ultrasound guided technique. Following insertion, Biopatch and dressing applied. Post procedure assessment: normal and unchanged  Patient tolerated the procedure well with no immediate complications.

## 2018-03-26 NOTE — Anesthesia Procedure Notes (Signed)
Procedure Name: Intubation Date/Time: 03/26/2018 8:29 AM Performed by: Adalberto Ill, CRNA Pre-anesthesia Checklist: Emergency Drugs available, Suction available, Patient being monitored, Timeout performed and Patient identified Patient Re-evaluated:Patient Re-evaluated prior to induction Oxygen Delivery Method: Circle system utilized Preoxygenation: Pre-oxygenation with 100% oxygen Induction Type: IV induction Ventilation: Mask ventilation without difficulty Laryngoscope Size: Miller and 2 Grade View: Grade I Tube type: Oral Tube size: 7.5 mm Number of attempts: 1 (brief atraumatic ) Airway Equipment and Method: Stylet Placement Confirmation: ETT inserted through vocal cords under direct vision,  positive ETCO2 and breath sounds checked- equal and bilateral Secured at: 23 cm Tube secured with: Tape Dental Injury: Teeth and Oropharynx as per pre-operative assessment

## 2018-03-26 NOTE — Op Note (Signed)
03/26/2018  11:25 AM  PATIENT:  Eric Barnett  67 y.o. 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  See anesthesia record  Delay start of Pharmacological VTE agent (>24hrs) due to surgical blood loss or risk of bleeding:  no  DRAINS: none   SPECIMEN:  Source of Specimen:  Gastric fundus with exophytic mass  DISPOSITION OF SPECIMEN:  PATHOLOGY  COUNTS:  YES  PLAN OF CARE: Admit to inpatient   PATIENT DISPOSITION:  PACU - hemodynamically stable.  INDICATION: Gentleman with incidental mass in the left upper retroperitoneum with hematuria work-up.  Suspicious for an adrenal mass.  Current work-up negative for any active secreting tumor.  Recommendation made by Dr. Lovena Neighbours with [facility] for probable left adrenalectomy.  Robotic possible open approach.  Informed consent had been given.  At the time of operation, Dr. Lovena Neighbours was able to mobilize and expose the left kidney and adrenal gland which were normal and intact.  Mass seem to be retrogastric and encapsulated.  Not associated with the pancreas, adrenal gland, kidney, spleen.  More suspicious for coming off of the stomach.  Intraoperative surgical consultation made.  I concurred that this seemed like a mass coming off the posterior gastric wall.  Most likely consistent with a gastrointestinal stromal tumor.  No evidence of any rupture of metastatic disease.  Dr. Lovena Neighbours and I came out talked with the family and recommended partial gastrectomy since he was  already asleep under general anesthesia.  Technique discussed.  Family agreed with go ahead and proceeded with gastrectomy since he was already appropriately positioned under general anesthesia.  OR FINDINGS: Lobulated well encapsulated mass pedunculated off the proximal stomach wall/fundus.  Going up a fair amount of the left upper quadrant retroperitoneum but no invasion into spleen, pancreas, adrenal gland, kidney, etc.  Mass removed with fundus stomach wall with robotic stapling.  No evidence of any accessory spleen, nodularity, lymphadenopathy, metastatic disease.  DESCRIPTION: Patient was already asleep in position for anticipated left adrenalectomy.  See Dr. Elspeth Cho note.  The use the robot to review the anatomy exposed.  I took over the case.  I concurred that the left kidney and adrenal gland were away from the mass of concern.  Pancreas not involved.  Firm mass noted in the upper abdomen.  Seemed to be coming off the proximal posterior stomach wall.  Omentum and spleen nearby.  I ended up transecting the stomach off the spleen.  Transected the short gastric vessels with a vessel sealer.  Came across easily.  This better expose the posterior stomach wall to confirm a bulky pedunculated mass involved with the proximal posterior gastric wall of the fundus.  Spleen not involved.  No lymphadenopathy.  No accessory spleen.  No other organs involved.  Mass was well encapsulated.  Proceeded with partial gastrectomy.  Orogastric tube pulled back.  Used a green load robotic stapler to transect the mass and fundus off the proximal stomach, starting third the way down the greater curvature of the stomach, then being towards the lesser curve  leading superior bulge at the level of the angle of His.   45 mm stapler x3 firings.   This transected the fundus of the stomach with the mass.  Was placed inside a large Endo Catch bag.  Hemostasis was excellent.  Staple line clean with no bleeding or other  abnormality.  Nasogastric tube was passed down transnasally towards the pylorus & secured.  I returned the case back to Dr. Lovena Neighbours for extraction of closure.  Please see his operative note.  Adin Hector, M.D., F.A.C.S. Gastrointestinal and Minimally Invasive Surgery [facility] [address], [place], [place] [address] [phone] Main / Paging

## 2018-03-26 NOTE — Op Note (Addendum)
Operative Note  Preoperative diagnosis:  1.  7.5 cm left adrenal mass  Postoperative diagnosis: 1.  7.5 cm gastric mass  Procedure(s): 1.  Robot-assisted laparoscopic mobilization of splenic flexure and retroperitoneal exploration  Surgeon: Ellison Hughs, MD  Assistants:  Debbrah Alar [facility] An assistant was required for this surgical procedure.  The duties of the assistant included but were not limited to suctioning, passing suture, camera manipulation, retraction.  This procedure would not be able to be performed without an Environmental consultant.   Anesthesia:  General  Complications:  None  EBL: 50 mL  Specimens: 1.  Gastric tumor  Drains/Catheters: 1.  Foley catheter 2.  NG tube  Intraoperative findings:   1. 7.5 cm mass emanating from the posterior fundus of the stomach with no obvious involvement of the left adrenal gland  Indication:  Roberta Kelly is a 67 y.o. 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  He has been consented a left adrenalectomy for the above procedures, voiced understanding wished to proceed.  Intraoperative informed consent was obtained from the patient's family when he was found to have a gastric tumor that required resection by Dr. Johney Maine.  Description of procedure:  After informed consent was obtained, the patient was brought to the operating room and general endotracheal anesthesia was administered.  The patient was placed in the right lateral decubitus position and prepped and draped in usual sterile fashion.  A timeout was performed.  An 8 mm incision was then made lateral to the left rectus muscle at the level of the left 11th rib.  Abdominal access was obtained via a Veress needle.  The abdominal cavity was then insufflated up to 15 mmHg.  An 8 mm port was then introduced into the abdominal cavity.  Inspection of the port entry site by the robotic camera revealed no adjacent organ  injury.  We then placed 3 additional 8 mm robotic ports to triangulate the left upper quadrant.  A 12 mm assistant port was then placed between the upper robotic port and the camera, in the midline.  The white line of Toldt along the splenic flexure of the colon was incised sharply, along with its mesocolonic fat, was reflected medially until the aorta was identified.  The mass in question was found to be densely adherent to the pancreas as well as the posterior aspects of the spleen.  I then mobilized the lateral splenic attachments and reflected the tail of the pancreas medially, exposing the mass.  The mass was found to be densely adherent to the inferior portion of the posterior fundus of the stomach.  At that point, Dr. Johney Maine with general surgery was called for intraoperative consultation.  He agreed that the mass was emanating from the fundus of the stomach and needed to be resected via sleeve gastrectomy.  The family was notified of the change in operative plans and consent was obtained.  Dr. Johney Maine then proceeded with a sleeve gastrectomy excision of gastric mass.  Once the mass was freely mobile, was placed in an Endo Catch bag.  There were no obvious areas of bleeding and the spleen appeared to be well perfused.  Using a Carter-Thomason, the left lower quadrant robotic port was then closed with an interrupted 0 Vicryl suture at that point, the abdomen was then desufflated and the robotic ports were removed.  The midline assistant port was then extended approximately 7 cm and the mass was removed within the  Endo Catch bag.  The fascia within the midline incision was then closed using a 0 PDS suture in a running fashion.  The skin incisions were then closed using a running 4-0 Monocryl and dressed with Dermabond.  The patient tolerated the procedure well and was transferred to the postanesthesia in stable condition.  Plan: NG tube overnight with plans for removal and diet advancement tomorrow morning,  per Dr. Clyda Greener assessment.

## 2018-03-27 ENCOUNTER — Other Ambulatory Visit: Payer: Self-pay

## 2018-03-27 ENCOUNTER — Encounter (HOSPITAL_COMMUNITY): Payer: Self-pay

## 2018-03-27 LAB — BASIC METABOLIC PANEL
ANION GAP: 7 (ref 5–15)
BUN: 17 mg/dL (ref 8–23)
CO2: 28 mmol/L (ref 22–32)
Calcium: 9.5 mg/dL (ref 8.9–10.3)
Chloride: 104 mmol/L (ref 98–111)
Creatinine, Ser: 1.16 mg/dL (ref 0.61–1.24)
GFR calc Af Amer: >60 mL/min (ref >60)
GLUCOSE: 148 mg/dL — AB (ref 70–99)
POTASSIUM: 3.8 mmol/L (ref 3.5–5.1)
SODIUM: 139 mmol/L (ref 135–145)

## 2018-03-27 LAB — HEMOGLOBIN AND HEMATOCRIT, BLOOD
HEMATOCRIT: 39 % (ref 39.0–52.0)
HEMOGLOBIN: 13.1 g/dL (ref 13.0–17.0)

## 2018-03-27 MED ORDER — METHOCARBAMOL 1000 MG/10ML IJ SOLN
1000.0000 mg | Freq: Three times a day (TID) | INTRAVENOUS | Status: AC
  Administered 2018-03-27 (×2): 1000 mg via INTRAVENOUS
  Filled 2018-03-27 (×2): qty 10

## 2018-03-27 NOTE — Plan of Care (Signed)
  Problem: Education: Goal: Knowledge of General Education information will improve Description Including pain rating scale, medication(s)/side effects and non-pharmacologic comfort measures Outcome: Progressing   Problem: Clinical Measurements: Goal: Ability to maintain clinical measurements within normal limits will improve Outcome: Progressing Goal: Diagnostic test results will improve Outcome: Progressing Goal: Respiratory complications will improve Outcome: Progressing Goal: Cardiovascular complication will be avoided Outcome: Progressing   Problem: Activity: Goal: Risk for activity intolerance will decrease Outcome: Progressing   Problem: Pain Managment: Goal: General experience of comfort will improve Outcome: Not Progressing   Problem: Health Behavior/Discharge Planning: Goal: Ability to manage health-related needs will improve Outcome: Adequate for Discharge   Problem: Clinical Measurements: Goal: Will remain free from infection Outcome: Adequate for Discharge   Problem: Nutrition: Goal: Adequate nutrition will be maintained Outcome: Adequate for Discharge   Problem: Elimination: Goal: Will not experience complications related to bowel motility Outcome: Adequate for Discharge Goal: Will not experience complications related to urinary retention Outcome: Adequate for Discharge   Problem: Safety: Goal: Ability to remain free from injury will improve Outcome: Adequate for Discharge   Problem: Skin Integrity: Goal: Risk for impaired skin integrity will decrease Outcome: Adequate for Discharge

## 2018-03-27 NOTE — Progress Notes (Signed)
1 Day Post-Op Subjective: No acute events overnight.  C/o soreness this AM.  NG capped.  He denies N/V.  Surgery discussed.   Objective: Vital signs in last 24 hours: Temp:  [95 F (35 C)-98.8 F (37.1 C)] 98.6 F (37 C) (10/31 0606) Pulse Rate:  [70-91] 70 (10/31 0606) Resp:  [8-20] 16 (10/31 0606) BP: (142-162)/(88-111) 156/96 (10/31 0606) SpO2:  [99 %-100 %] 100 % (10/31 0606) Arterial Line BP: (137-174)/(89-110) 163/97 (10/30 1300)  Intake/Output from previous day: 10/30 0701 - 10/31 0700 In: 5849.6 [P.O.:45; I.V.:3438.1; IV Piggyback:2366.6] Out: 1935 [Urine:1750; Emesis/NG output:135; Blood:50]  Intake/Output this shift: No intake/output data recorded.  Physical Exam:  General: Alert and oriented CV: RRR, palpable distal pulses Lungs: CTAB, equal chest rise Abdomen: Soft, NTND, no rebound or guarding Incisions: c/d/i Gu: Foley draining clear urine  Ext: NT, No erythema  Lab Results: Recent Labs    03/26/18 1218 03/27/18 0431  HGB 12.5* 13.1  HCT 38.2* 39.0   BMET Recent Labs    03/27/18 0431  NA 139  K 3.8  CL 104  CO2 28  GLUCOSE 148*  BUN 17  CREATININE 1.16  CALCIUM 9.5     Studies/Results: No results found.  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 Johney Maine -From my standpoint, ok for discharge later this afternoon   LOS: 1 day   Ellison Hughs, MD [facility] Pager: [phone]  03/27/2018, 8:01 AM

## 2018-03-27 NOTE — Progress Notes (Signed)
Eric Barnett [id number] [birth year]  CARE TEAM:  PCP: Patient, No Pcp Per  Outpatient Care Team: Patient Care Team: Patient, No Pcp Per as PCP - General (General Practice) Lorretta Harp, MD as PCP - Cardiology (Cardiology) Ceasar Mons, MD as Consulting Physician (Urology) Michael Boston, MD as Consulting Physician (General Surgery)  Inpatient Treatment Team: Treatment Team: Attending Provider: Ceasar Mons, MD; Consulting Physician: Michael Boston, MD; Consulting Physician: Ceasar Mons, MD; Technician: Kizzie Furnish, NT; Registered Nurse: Weston Anna, RN; Technician: Loree Fee, NT; Student Nurse: Mirna Mires; Technician: Lucila Maine, NT   Problem List:   Principal Problem:   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  P.o. liquid trial. -If patient tolerates clear liquid meal breakfast Thu AM, d/c NGT & advance diet to full liquids/Dys1 for lunch Thu 03/27/2018.  IF: patient experiences: nausea, vomiting, or worsening abdominal pain OR if NGT residual >269m, THEN: replace NGT to Low Intermittent Wall Suction (-50) DC Foley.  Standing Tylenol Robaxin and ice for soreness.  Follow-up on pathology  HTN.  As needed IV medications.  Once tolerating oral intake, resume home hypertensive meds.  VTE prophylaxis- SCDs, etc   Mobilize as tolerated to help recovery  20 minutes spent in review, evaluation, examination, counseling, and coordination of care.  More than 50% of that time was spent in  counseling.  I discussed operative findings, updated the patient's status, discussed probable steps to recovery, and gave postoperative recommendations to the patient.  Recommendations were made.  Questions were answered.  He expressed understanding & appreciation.   03/27/2018    Subjective: (Chief complaint)  Soreness when he tries to get up.  No nausea or retching.    Objective:  Vital signs:  Vitals:   03/26/18 1518 03/26/18 2112 03/27/18 0100 03/27/18 0606  BP: (!) 156/92 (!) 162/96 (!) 157/92 (!) 156/96  Pulse: 88 88 72 70  Resp: '16 20  16  ' Temp: 98 F (36.7 C) 98.8 F (37.1 C)  98.6 F (37 C)  TempSrc: Oral Oral  Oral  SpO2: 100% 100%  100%  Weight:      Height:        Last BM Date: 03/25/18  Intake/Output   Yesterday:  10/30 0701 - 10/31 0700 In: 5849.6 [P.O.:45; I.V.:3438.1; IV Piggyback:2366.6] Out: 1935 [Urine:1750; Emesis/NG output:135; Blood:50] This shift:  No intake/output data recorded.  Bowel function:  Flatus: YES  BM:  No  Drain: Scant thin bilious output in nasogastric tube canister   Physical Exam:  General: Pt awake/alert/oriented x4 in no acute distress Eyes: PERRL, normal EOM.  Sclera clear.  No icterus Neuro: CN II-XII intact w/o focal sensory/motor deficits. Lymph: No head/neck/groin lymphadenopathy Psych:  No delerium/psychosis/paranoia HENT: Normocephalic, Mucus membranes moist.  No thrush Neck: Supple, No tracheal deviation Chest: No chest wall pain w good excursion CV:  Pulses intact.  Regular rhythm MS: Normal AROM mjr joints.  No obvious deformity  Abdomen: Soft.  Nondistended.  Mildly tender at incisions only.  No evidence of peritonitis.  No incarcerated hernias.  Ext:  No  deformity.  No mjr edema.  No cyanosis Skin: No petechiae / purpura  Results:   Labs: Results for orders placed or performed during the hospital encounter of 03/26/18 (from the past 48 hour(s))  Type and screen [place]      Status: None   Collection Time: 03/26/18  6:40 AM  Result Value Ref Range   ABO/RH(D) O POS    Antibody Screen NEG    Sample Expiration      03/29/2018 Performed at [facility], [place] [address] [place], [place] [address]   Hemoglobin and hematocrit, blood     Status: Abnormal   Collection Time: 03/26/18 12:18 PM  Result Value Ref Range   Hemoglobin 12.5 (L) 13.0 - 17.0 g/dL   HCT 38.2 (L) 39.0 - 52.0 %    Comment: Performed at [facility], [place] [address], [place], [place] [address]  Basic metabolic panel     Status: Abnormal   Collection Time: 03/27/18  4:31 AM  Result Value Ref Range   Sodium 139 135 - 145 mmol/L   Potassium 3.8 3.5 - 5.1 mmol/L   Chloride 104 98 - 111 mmol/L   CO2 28 22 - 32 mmol/L   Glucose, Bld 148 (H) 70 - 99 mg/dL   BUN 17 8 - 23 mg/dL   Creatinine, Ser 1.16 0.61 - 1.24 mg/dL   Calcium 9.5 8.9 - 10.3 mg/dL   GFR calc non Af Amer >60 >60 mL/min   GFR calc Af Amer >60 >60 mL/min    Comment: (NOTE) The eGFR has been calculated using the CKD EPI equation. This calculation has not been validated in all clinical situations. eGFR's persistently <60 mL/min signify possible Chronic Kidney Disease.    Anion gap 7 5 - 15    Comment: Performed at [facility], [place] [address], [place], [place] [address]  Hemoglobin and hematocrit, blood     Status: None   Collection Time: 03/27/18  4:31 AM  Result Value Ref Range   Hemoglobin 13.1 13.0 - 17.0 g/dL   HCT 39.0 39.0 - 52.0 %    Comment: Performed at [facility], [place] [address], [place], [place] [address]    Imaging / Studies: No results found.  Medications / Allergies: per chart  Antibiotics: Anti-infectives (From admission, onward)   Start     Dose/Rate Route Frequency Ordered Stop   03/26/18 0545  ceFAZolin (ANCEF) IVPB 2g/100 mL premix     2 g 200 mL/hr over 30 Minutes Intravenous  Once 03/26/18 0540 03/26/18 0901         Note: Portions of this report may have been transcribed using voice recognition software. Every effort was made to ensure accuracy; however, inadvertent computerized transcription errors may be present.   Any transcriptional errors that result from this process are unintentional.     Adin Hector, MD, FACS, MASCRS Gastrointestinal and Minimally Invasive Surgery    [address] [place], [place] [address] [phone] Main / Paging [phone] Fax

## 2018-03-28 ENCOUNTER — Encounter (HOSPITAL_COMMUNITY): Payer: Self-pay | Admitting: *Deleted

## 2018-03-28 DIAGNOSIS — E559 Vitamin D deficiency, unspecified: Secondary | ICD-10-CM | POA: Insufficient documentation

## 2018-03-28 DIAGNOSIS — N529 Male erectile dysfunction, unspecified: Secondary | ICD-10-CM | POA: Insufficient documentation

## 2018-03-28 MED ORDER — SODIUM CHLORIDE 0.9% FLUSH: 3.0000 mL | Freq: Two times a day (BID) | INTRAVENOUS | Status: DC

## 2018-03-28 MED ORDER — PANTOPRAZOLE SODIUM 40 MG PO TBEC
40.0000 mg | DELAYED_RELEASE_TABLET | Freq: Every day | ORAL | Status: DC
  Administered 2018-03-28: 40 mg via ORAL
  Filled 2018-03-28: qty 1

## 2018-03-28 MED ORDER — LACTATED RINGERS IV BOLUS: 1000.0000 mL | Freq: Three times a day (TID) | INTRAVENOUS | Status: DC | PRN

## 2018-03-28 MED ORDER — INFLUENZA VAC SPLIT HIGH-DOSE 0.5 ML IM SUSY: 0.5000 mL | PREFILLED_SYRINGE | INTRAMUSCULAR | Status: DC

## 2018-03-28 MED ORDER — GABAPENTIN 300 MG PO CAPS
300.0000 mg | ORAL_CAPSULE | Freq: Two times a day (BID) | ORAL | Status: DC
  Administered 2018-03-28: 300 mg via ORAL
  Filled 2018-03-28: qty 1

## 2018-03-28 MED ORDER — ACETAMINOPHEN 500 MG PO TABS
500.0000 mg | ORAL_TABLET | Freq: Three times a day (TID) | ORAL | Status: DC
  Administered 2018-03-28: 500 mg via ORAL
  Filled 2018-03-28: qty 1

## 2018-03-28 MED ORDER — SODIUM CHLORIDE 0.9% FLUSH: 3.0000 mL | INTRAVENOUS | Status: DC | PRN

## 2018-03-28 MED ORDER — PANTOPRAZOLE SODIUM 40 MG PO TBEC: 40.0000 mg | DELAYED_RELEASE_TABLET | Freq: Every day | ORAL | 0 refills | Status: DC

## 2018-03-28 MED ORDER — SODIUM CHLORIDE 0.9 % IV SOLN: 250.0000 mL | INTRAVENOUS | Status: DC | PRN

## 2018-03-28 MED ORDER — HYDROCODONE-ACETAMINOPHEN 5-325 MG PO TABS: 1.0000 | ORAL_TABLET | ORAL | Status: DC | PRN

## 2018-03-28 NOTE — Discharge Summary (Addendum)
Physician Discharge Summary    Patient ID: Eric Barnett MRN: [id number] DOB/AGE: 12/26/1950  67 y.o.  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 NO ADRENAL TUMOR  SURGERY:  XI ROBOTIC PARTIAL GASTRECTOMY  SURGEON:  Michael Boston, MD  Operative Note  Preoperative diagnosis:  1.  7.5 cm left adrenal mass  Postoperative diagnosis: 1.  7.5 cm gastric mass  Procedure(s): 1.  Robot-assisted laparoscopic mobilization of splenic flexure and retroperitoneal exploration  Surgeon: Ellison Hughs, MD  Assistants:  [facility] An assistant was required for this surgical procedure.  The duties of the assistant included but were not limited to suctioning, passing suture, camera manipulation, retraction.  This procedure would not be able to be performed without an Environmental consultant.    Consults: urology  Hospital Course:   Patient with hematuria.  CT scan revealed mass in left upper quadrant and retroperitoneal region.  Suspicious for adrenal tumor.  Work-up negative for any physiologic/normal activity.  Underwent attempted left robotic adrenalectomy.  Tumor found not to be associated with the adrenal gland but actually with the stomach.  Intraoperative surgical consultation made with myself.  Performed robotic partial gastrectomy to remove the tumor fundus of the stomach.   Postoperatively, the patient gradually mobilized.  He had no evidence of gastric ileus.  Tolerated NG tube clamping trial.  Tube removed.  Advanced to a soft diet.  Pain and other symptoms were treated aggressively.    By the time of discharge, the patient was walking well the hallways, eating food, having flatus.  Pain was well-controlled on an oral medications.  Based on meeting discharge criteria and continuing to recover, I felt it was safe for the patient to be discharged from the hospital to further recover with close followup. Postoperative recommendations were discussed in detail.  They are written as well.  Discharged Condition: good  Disposition:  Follow-up Information    Ceasar Mons, MD Follow up on 04/03/2018.   Specialty:  Urology Why:  at 9:00 Contact information: [address] [place] [address] [phone]        Michael Boston, MD. Schedule an appointment as soon as possible for a visit in 3 weeks.   Specialty:  General Surgery Why:  To follow up after your operation, To follow up after your hospital stay Contact information: [place] [address] [phone]           Discharge disposition: 01-Home or Self Care       Discharge Instructions    Call MD for:   Complete by:  As directed    Temperature > 101.26F   Call MD for:  extreme fatigue   Complete by:  As directed    Call MD for:  hives   Complete by:  As directed    Call MD for:  persistant nausea and vomiting   Complete by:  As directed  Call MD for:  redness, tenderness, or signs of infection (pain, swelling, redness, odor or green/yellow discharge around incision site)   Complete by:  As directed    Call MD for:  severe uncontrolled pain   Complete by:  As directed    Diet general   Complete by:  As directed    SEE ESOPHAGEAL SURGERY DIET INSTRUCTIONS  We using usually start you out on a pureed (blenderized) diet. Expect some  sticking with swallowing over the next 1-2 months.   This is due to swelling around your esophagus at the wrap & hiatal diaphragm repair.  It will gradually ease off over the next few months.   Discharge instructions   Complete by:  As directed    Please see discharge instruction sheets.   Also refer to any handouts/printouts that may have been given from the [facility] surgery office (if you visited [place] there before surgery) Please call our office if you have any questions or concerns [phone]   Driving Restrictions   Complete by:  As directed    No driving until off narcotics and can safely swerve away without pain during an emergency   Increase activity slowly   Complete by:  As directed    Lifting restrictions   Complete by:  As directed    Avoid heavy lifting initially, <20 pounds at first.   Do not push through pain.   You have no specific weight limit: If it hurts to do, DON'T DO IT.    If you feel no pain, you are not injuring anything.  Pain will protect you from injury.   Coughing and sneezing are far more stressful to your incision than any lifting.   Avoid resuming heavy lifting (>50 pounds) or other intense activity until off all narcotic pain medications.   When want to exercise more, give yourself 2 weeks to gradually get back to full intense exercise/activity.   May shower / Bathe   Complete by:  As directed    [place].  It is fine for dressings or wounds to be washed/rinsed.  Use gentle soap & water.  This will help the incisions and/or wounds get clean & minimize infection.   May walk up steps   Complete by:  As directed    Remove dressing in 72 hours   Complete by:  As directed    You have closed incisions: Shower and bathe over these incisions with soap and water every day.  It is OK to wash over the dressings: they are waterproof.  You do not need to replace dressings over the closed incisions unless you feel more comfortable with a Band-Aid covering it.    Please call our office [phone] if you have further questions.   Sexual Activity Restrictions   Complete by:  As directed    Sexual activity as tolerated.  Do not push through pain.  Pain will protect you from injury.   Walk with assistance   Complete by:  As directed    Walk over an hour a day.  May use a walker/cane/companion to help with balance and stamina.      Allergies as of 03/28/2018      Reactions   Aspirin Nausea Only, Other (See Comments)   HIGH DOSE  Upset patients   stomach-       Medication List    TAKE these medications   atorvastatin 40 MG tablet Commonly known as:  LIPITOR Take 1 tablet (40 mg  total) by mouth daily.   HYDROcodone-acetaminophen 5-325 MG tablet Commonly known as:  NORCO/VICODIN Take 1-2 tablets by mouth every 6 (six) hours as needed for moderate pain.   losartan-hydrochlorothiazide 100-25 MG tablet Commonly known as:  HYZAAR Take 1 tablet by mouth every evening.   pantoprazole 40 MG tablet Commonly known as:  PROTONIX Take 1 tablet (40 mg total) by mouth daily.   promethazine 12.5 MG tablet Commonly known as:  PHENERGAN Take 1 tablet (12.5 mg total) by mouth every 4 (four) hours as needed for nausea or vomiting.   tamsulosin 0.4 MG Caps capsule Commonly known as:  FLOMAX Take 0.4 mg by mouth daily.       Significant Diagnostic Studies:  Results for orders placed or performed during the hospital encounter of 03/26/18 (from the past 72 hour(s))  Type and screen [place]     Status: None   Collection Time: 03/26/18  6:40 AM  Result Value Ref Range   ABO/RH(D) O POS    Antibody Screen NEG    Sample Expiration      03/29/2018 Performed at [facility], [place] [address] [place], [place] [address]   Hemoglobin and hematocrit, blood     Status: Abnormal   Collection Time: 03/26/18 12:18 PM  Result Value Ref Range   Hemoglobin 12.5 (L) 13.0 - 17.0 g/dL   HCT 38.2 (L) 39.0 - 52.0 %     Comment: Performed at [facility], [place] [address], [place], [place] [address]  Basic metabolic panel     Status: Abnormal   Collection Time: 03/27/18  4:31 AM  Result Value Ref Range   Sodium 139 135 - 145 mmol/L   Potassium 3.8 3.5 - 5.1 mmol/L   Chloride 104 98 - 111 mmol/L   CO2 28 22 - 32 mmol/L   Glucose, Bld 148 (H) 70 - 99 mg/dL   BUN 17 8 - 23 mg/dL   Creatinine, Ser 1.16 0.61 - 1.24 mg/dL   Calcium 9.5 8.9 - 10.3 mg/dL   GFR calc non Af Amer >60 >60 mL/min   GFR calc Af Amer >60 >60 mL/min    Comment: (NOTE) The eGFR has been calculated using the CKD EPI equation. This calculation has not been validated in all clinical situations. eGFR's persistently <60 mL/min signify possible Chronic Kidney Disease.    Anion gap 7 5 - 15    Comment: Performed at [facility], [place] [address], [place], [place] [address]  Hemoglobin and hematocrit, blood     Status: None   Collection Time: 03/27/18  4:31 AM  Result Value Ref Range   Hemoglobin 13.1 13.0 - 17.0 g/dL   HCT 39.0 39.0 - 52.0 %    Comment: Performed at [facility], [place] [address], [place], [place] [address]    No results found.  Discharge Exam: Blood pressure (!) 148/98, pulse 86, temperature 97.8 F (36.6 C), temperature source Oral, resp. rate 20, height '6\' 3"'  (1.905 m), weight 79.4 kg, SpO2 98 %.  General: Pt awake/alert/oriented x4 in No acute distress Eyes: PERRL, normal EOM.  Sclera clear.  No icterus Neuro: CN II-XII intact w/o focal sensory/motor deficits. Lymph: No head/neck/groin lymphadenopathy Psych:  No delerium/psychosis/paranoia HENT: Normocephalic, Mucus membranes moist.  No thrush Neck: Supple, No tracheal deviation Chest: No chest wall pain w good excursion CV:  Pulses intact.  Regular rhythm MS: Normal AROM mjr joints.  No obvious deformity Abdomen: Soft.  Nondistended.  Mildly tender at incisions only.  No evidence of peritonitis.  No  incarcerated hernias. Ext:  SCDs BLE.  No mjr edema.  No cyanosis Skin: No petechiae / purpura  Past Medical History:  Diagnosis Date  . AAA (abdominal aortic aneurysm) ([place])   . Hyperchloremia   . Hyperlipidemia   . Hypertension   . Irregular heartbeat   . Vitamin D deficiency     Past Surgical History:  Procedure Laterality Date  . ABDOMINAL AORTIC ENDOVASCULAR STENT GRAFT N/A 08/30/2014   Procedure: ABDOMINAL AORTIC ENDOVASCULAR STENT GRAFT;  Surgeon: Elam Dutch, MD;  Location: [facility];  Service: Vascular;  Laterality: N/A;  . HERNIA REPAIR     bil groins " several time"  . ROBOTIC ADRENALECTOMY     Dr. Lovena Neighbours 01-01-18  . ROBOTIC ADRENALECTOMY Left 03/26/2018   Procedure: XI ROBOTIC PARTIAL  GASTRECTOMY;  Surgeon: Ceasar Mons, MD;  Location: [facility];  Service: Urology;  Laterality: Left;  . TONSILLECTOMY      Social History   Socioeconomic History  . Marital status: Divorced    Spouse name: Not on file  . Number of children: 0  . Years of education: Not on file  . Highest education level: Not on file  Occupational History  . Occupation: Retired  Scientific laboratory technician  . Financial resource strain: Not on file  . Food insecurity:    Worry: Not on file    Inability: Not on file  . Transportation needs:    Medical: Not on file    Non-medical: Not on file  Tobacco Use  . Smoking status: Current Some Day Smoker    Packs/day: 0.25    Years: 17.00    Pack years: 4.25    Types: Cigarettes  . Smokeless tobacco: Former Systems developer    Types: Chew    Quit date: 01/20/1974  . Tobacco comment: 3-4 cigarettes per day  Substance and Sexual Activity  . Alcohol use: Not Currently    Alcohol/week: 1.0 standard drinks    Types: 1 Standard drinks or equivalent per week    Comment: 1/2 pint per week  . Drug use: Not Currently    Types: Marijuana, Cocaine    Comment: none in a long time per pt.  . Sexual activity: Yes  Lifestyle  . Physical activity:    Days per week: Not on  file    Minutes per session: Not on file  . Stress: Not on file  Relationships  . Social connections:    Talks on phone: Not on file    Gets together: Not on file    Attends religious service: Not on file    Active member of club or organization: Not on file    Attends meetings of clubs or organizations: Not on file    Relationship status: Not on file  . Intimate partner violence:    Fear of current or ex partner: Not on file    Emotionally abused: Not on file    Physically abused: Not on file    Forced sexual activity: Not on file  Other Topics Concern  . Not on file  Social History Narrative  . Not on file    History reviewed. No pertinent family history.  Current Facility-Administered Medications  Medication Dose Route Frequency Provider Last Rate Last Dose  . 0.9 %  sodium chloride infusion  250 mL Intravenous PRN Michael Boston, MD      . acetaminophen (TYLENOL) tablet 500 mg  500  mg Oral TID Michael Boston, MD      . alum & mag hydroxide-simeth (MAALOX/MYLANTA) 200-200-20 MG/5ML suspension 30 mL  30 mL Oral Q6H PRN Michael Boston, MD      . bisacodyl (DULCOLAX) suppository 10 mg  10 mg Rectal Q12H PRN Michael Boston, MD      . diphenhydrAMINE (BENADRYL) injection 12.5 mg  12.5 mg Intravenous Q6H PRN Dancy, Amanda, PA-C       Or  . diphenhydrAMINE (BENADRYL) 12.5 MG/5ML elixir 12.5 mg  12.5 mg Oral Q6H PRN Dancy, Amanda, PA-C      . gabapentin (NEURONTIN) capsule 300 mg  300 mg Oral BID Michael Boston, MD      . guaiFENesin-dextromethorphan (ROBITUSSIN DM) 100-10 MG/5ML syrup 10 mL  10 mL Oral Q4H PRN Michael Boston, MD      . hydrALAZINE (APRESOLINE) injection 5-10 mg  5-10 mg Intravenous Q4H PRN Michael Boston, MD      . HYDROcodone-acetaminophen (NORCO/VICODIN) 5-325 MG per tablet 1-2 tablet  1-2 tablet Oral Q4H PRN Michael Boston, MD      . hydrocortisone (ANUSOL-HC) 2.5 % rectal cream 1 application  1 application Topical QID PRN Michael Boston, MD      . hydrocortisone cream 1  % 1 application  1 application Topical TID PRN Michael Boston, MD      . HYDROmorphone (DILAUDID) injection 0.5-1 mg  0.5-1 mg Intravenous Q2H PRN Debbrah Alar, PA-C      . [START ON 03/29/2018] Influenza vac split quadrivalent PF (FLUZONE HIGH-DOSE) injection 0.5 mL  0.5 mL Intramuscular Tomorrow-1000 Winter, Christopher Aaron, MD      . lactated ringers bolus 1,000 mL  1,000 mL Intravenous TID PRN Michael Boston, MD      . lip balm (CARMEX) ointment 1 application  1 application Topical BID Michael Boston, MD      . LORazepam (ATIVAN) injection 0.5-1 mg  0.5-1 mg Intravenous Q8H PRN Michael Boston, MD      . magic mouthwash  15 mL Oral QID PRN Michael Boston, MD      . menthol-cetylpyridinium (CEPACOL) lozenge 3 mg  1 lozenge Oral PRN Michael Boston, MD      . metoCLOPramide (REGLAN) injection 5-10 mg  5-10 mg Intravenous Q8H PRN Michael Boston, MD      . metoprolol tartrate (LOPRESSOR) injection 5 mg  5 mg Intravenous Q6H PRN Michael Boston, MD      . ondansetron (ZOFRAN) injection 4 mg  4 mg Intravenous Q6H PRN Michael Boston, MD       Or  . ondansetron (ZOFRAN) 8 mg in sodium chloride 0.9 % 50 mL IVPB  8 mg Intravenous Q6H PRN Michael Boston, MD      . ondansetron (ZOFRAN) injection 4 mg  4 mg Intravenous Q4H PRN Dancy, Amanda, PA-C      . pantoprazole (PROTONIX) EC tablet 40 mg  40 mg Oral Daily Michael Boston, MD      . phenol (CHLORASEPTIC) mouth spray 1-2 spray  1-2 spray Mouth/Throat PRN Michael Boston, MD      . prochlorperazine (COMPAZINE) injection 5-10 mg  5-10 mg Intravenous Q4H PRN Michael Boston, MD      . sodium chloride flush (NS) 0.9 % injection 3 mL  3 mL Intravenous Gorden Harms, MD      . sodium chloride flush (NS) 0.9 % injection 3 mL  3 mL Intravenous PRN Michael Boston, MD         Allergies  Allergen Reactions  .  Aspirin Nausea Only and Other (See Comments)    HIGH DOSE  Upset patients   stomach-     Signed: Morton Peters, MD, FACS,  [facility]    [address], [place], [place] [address] [phone] Main / Paging [phone] Fax   03/28/2018, 7:20 AM

## 2018-03-28 NOTE — Discharge Instructions (Signed)
LAPAROSCOPIC SURGERY: POST OP INSTRUCTIONS  ######################################################################  EAT Gradually transition to a high fiber diet with a fiber supplement over the next few weeks after discharge.  Start with a pureed / full liquid diet (see below)  WALK Walk an hour a day.  Control your pain to do that.    CONTROL PAIN Control pain so that you can walk, sleep, tolerate sneezing/coughing, go up/down stairs.  HAVE A BOWEL MOVEMENT DAILY Keep your bowels regular to avoid problems.  OK to try a laxative to override constipation.  OK to use an antidairrheal to slow down diarrhea.  Call if not better after 2 tries  CALL IF YOU HAVE PROBLEMS/CONCERNS Call if you are still struggling despite following these instructions. Call if you have concerns not answered by these instructions  ######################################################################    1. DIET: Follow a light bland diet the first 24 hours after arrival home, such as soup, liquids, crackers, etc.  Be sure to include lots of fluids daily.  Avoid fast food or heavy meals as your are more likely to get nauseated.  Eat a low fat the next few days after surgery.   2. Take your usually prescribed home medications unless otherwise directed. 3. PAIN CONTROL: a. Pain is best controlled by a usual combination of three different methods TOGETHER: i. Ice/Heat ii. Over the counter pain medication iii. Prescription pain medication b. Most patients will experience some swelling and bruising around the incisions.  Ice packs or heating pads (30-60 minutes up to 6 times a day) will help. Use ice for the first few days to help decrease swelling and bruising, then switch to heat to help relax tight/sore spots and speed recovery.  Some people prefer to use ice alone, heat alone, alternating between ice & heat.  Experiment to what works for you.  Swelling and bruising can take several weeks to resolve.   c. It is  helpful to take an over-the-counter pain medication regularly for the first few weeks.  Choose one of the following that works best for you: i. Naproxen (Aleve, etc)  Two 251m tabs twice a day ii. Ibuprofen (Advil, etc) Three 2053mtabs four times a day (every meal & bedtime) iii. Acetaminophen (Tylenol, etc) 500-65044mour times a day (every meal & bedtime) d. A  prescription for pain medication (such as oxycodone, hydrocodone, etc) should be given to you upon discharge.  Take your pain medication as prescribed.  i. If you are having problems/concerns with the prescription medicine (does not control pain, nausea, vomiting, rash, itching, etc), please call us [phone] see if we need to switch you to a different pain medicine that will work better for you and/or control your side effect better. ii. If you need a refill on your pain medication, please contact your pharmacy.  They will contact our office to request authorization. Prescriptions will not be filled after 5 pm or on week-ends. 4. Avoid getting constipated.  Between the surgery and the pain medications, it is common to experience some constipation.  Increasing fluid intake and taking a fiber supplement (such as Metamucil, Citrucel, FiberCon, MiraLax, etc) 1-2 times a day regularly will usually help prevent this problem from occurring.  A mild laxative (prune juice, Milk of Magnesia, MiraLax, etc) should be taken according to package directions if there are no bowel movements after 48 hours.   5. Watch out for diarrhea.  If you have many loose bowel movements, simplify your diet to bland foods & liquids for  a few days.  Stop any stool softeners and decrease your fiber supplement.  Switching to mild anti-diarrheal medications (Kayopectate, Pepto Bismol) can help.  If this worsens or does not improve, please call us. 6. Wash / shower every day.  You may shower over the dressings as they are waterproof.  Continue to shower over incision(s)  after the dressing is off. 7. Remove your waterproof bandages 5 days after surgery.  You may leave the incision open to air.  You may replace a dressing/Band-Aid to cover the incision for comfort if you wish.  8. ACTIVITIES as tolerated:   a. You may resume regular (light) daily activities beginning the next day--such as daily self-care, walking, climbing stairs--gradually increasing activities as tolerated.  If you can walk 30 minutes without difficulty, it is safe to try more intense activity such as jogging, treadmill, bicycling, low-impact aerobics, swimming, etc. b. Save the most intensive and strenuous activity for last such as sit-ups, heavy lifting, contact sports, etc  Refrain from any heavy lifting or straining until you are off narcotics for pain control.   c. DO NOT PUSH THROUGH PAIN.  Let pain be your guide: If it hurts to do something, don't do it.  Pain is your body warning you to avoid that activity for another week until the pain goes down. d. You may drive when you are no longer taking prescription pain medication, you can comfortably wear a seatbelt, and you can safely maneuver your car and apply brakes. e. Dennis Bast may have sexual intercourse when it is comfortable.  9. FOLLOW UP in our office a. Please call [facility] at [phone] to set up an appointment to see your surgeon in the office for a follow-up appointment approximately 2-3 weeks after your surgery. b. Make sure that you call for this appointment the day you arrive home to insure a convenient appointment time. 10. IF YOU HAVE DISABILITY OR FAMILY LEAVE FORMS, BRING THEM TO THE OFFICE FOR PROCESSING.  DO NOT GIVE THEM TO YOUR DOCTOR.   WHEN TO CALL us [phone]: 1. Poor pain control 2. Reactions / problems with new medications (rash/itching, nausea, etc)  3. Fever over 101.5 F (38.5 C) 4. Inability to urinate 5. Nausea and/or vomiting 6. Worsening swelling or bruising 7. Continued bleeding from incision. 8. Increased  pain, redness, or drainage from the incision   The clinic staff is available to answer your questions during regular business hours (8:30am-5pm).  Please dont hesitate to call and ask to speak to one of our nurses for clinical concerns.   If you have a medical emergency, go to the nearest emergency room or call 911.  A surgeon from [facility] is always on call at the [facility], [place], [place], [place], [place]  [address] ? MAIN: [phone] ? TOLL FREE: [phone] ?  FAX [phone] [email or web address]  EATING AFTER YOUR ESOPHAGEAL SURGERY (Stomach Fundoplication, Hiatal Hernia repair, Achalasia surgery, etc)  ######################################################################  EAT Start with a pureed / full liquid diet (see below) Gradually transition to a high fiber diet with a fiber supplement over the next month after discharge.    WALK Walk an hour a day.  Control your pain to do that.    CONTROL PAIN Control pain so that you can walk, sleep, tolerate sneezing/coughing, go up/down stairs.  HAVE A BOWEL MOVEMENT DAILY Keep your bowels regular to avoid problems.  OK to try a laxative to override  constipation.  OK to use an antidairrheal to slow down diarrhea.  Call if not better after 2 tries  CALL IF YOU HAVE PROBLEMS/CONCERNS Call if you are still struggling despite following these instructions. Call if you have concerns not answered by these instructions  ######################################################################   After your esophageal surgery, expect some sticking with swallowing over the next 1-2 months.    If food sticks when you eat, it is called "dysphagia".  This is due to swelling around your esophagus at the wrap & hiatal diaphragm repair.  It will gradually ease off over the next few months.  To help you through this temporary phase, we start you out on a pureed  (blenderized) diet.  Your first meal in the hospital was thin liquids.  You should have been given a pureed diet by the time you left the hospital.  We ask patients to stay on a pureed diet for the first 2-3 weeks to avoid anything getting "stuck" near your recent surgery.  Don't be alarmed if your ability to swallow doesn't progress according to this plan.  Everyone is different and some diets can advance more or less quickly.     Some BASIC RULES to follow are:  Maintain an upright position whenever eating or drinking.  Take small bites - just a teaspoon size bite at a time.  Eat slowly.  It may also help to eat only one food at a time.  Consider nibbling through smaller, more frequent meals & avoid the urge to eat BIG meals  Do not push through feelings of fullness, nausea, or bloatedness  Do not mix solid foods and liquids in the same mouthful  Try not to "wash foods down" with large gulps of liquids.  Avoid carbonated (bubbly/fizzy) drinks.    Avoid foods that make you feel gassy or bloated.  Start with bland foods first.  Wait on trying greasy, fried, or spicy meals until you are tolerating more bland solids well.  Understand that it will be hard to burp and belch at first.  This gradually improves with time.  Expect to be more gassy/flatulent/bloated initially.  Walking will help your body manage it better.  Consider using medications for bloating that contain simethicone such as  Maalox or Gas-X   Eat in a relaxed atmosphere & minimize distractions.  Avoid talking while eating.    Do not use straws.  Following each meal, sit in an upright position (90 degree angle) for 60 to 90 minutes.  Going for a short walk can help as well  If food does stick, don't panic.  Try to relax and let the food pass on its own.  Sipping WARM LIQUID such as strong hot black tea can also help slide it down.   Be gradual in changes & use common sense:  -If you easily tolerating a certain  "level" of foods, advance to the next level gradually -If you are having trouble swallowing a particular food, then avoid it.   -If food is sticking when you advance your diet, go back to thinner previous diet (the lower LEVEL) for 1-2 days.  LEVEL 1 = PUREED DIET  Do for the first 2 WEEKS AFTER SURGERY  -Foods in this group are pureed or blenderized to a smooth, mashed potato-like consistency.  -If necessary, the pureed foods can keep their shape with the addition of a thickening agent.   -Meat should be pureed to a smooth, pasty consistency.  Hot broth or gravy may be added  to the pureed meat, approximately 1 oz. of liquid per 3 oz. serving of meat. -CAUTION:  If any foods do not puree into a smooth consistency, swallowing will be more difficult.  (For example, nuts or seeds sometimes do not blend well.)  Hot Foods Cold Foods  Pureed scrambled eggs and cheese Pureed cottage cheese  Baby cereals Thickened juices and nectars  Thinned cooked cereals (no lumps) Thickened milk or eggnog  Pureed [place] toast or pancakes Ensure  Mashed potatoes Ice cream  Pureed parsley, au gratin, scalloped potatoes, candied sweet potatoes Fruit or [place] ice, sherbet  Pureed buttered or alfredo noodles Plain yogurt  Pureed vegetables (no corn or peas) Instant breakfast  Pureed soups and creamed soups Smooth pudding, mousse, custard  Pureed scalloped apples Whipped gelatin  Gravies Sugar, syrup, honey, jelly  Sauces, cheese, tomato, barbecue, white, creamed Cream  Any baby food Creamer  Alcohol in moderation (not beer or champagne) Margarine  Coffee or tea Mayonnaise   Ketchup, mustard   Apple sauce   SAMPLE MENU:  PUREED DIET Breakfast Lunch Dinner   Orange juice, 1/2 cup  Cream of wheat, 1/2 cup  Pineapple juice, 1/2 cup  Pureed [place], barley soup, 3/4 cup  Pureed Hawaiian chicken, 3 oz   Scrambled eggs, mashed or blended with cheese, 1/2 cup  Tea or coffee, 1 cup   Whole milk, 1 cup     Non-dairy creamer, 2 Tbsp.  Mashed potatoes, 1/2 cup  Pureed cooled broccoli, 1/2 cup  Apple sauce, 1/2 cup  Coffee or tea  Mashed potatoes, 1/2 cup  Pureed spinach, 1/2 cup  Frozen yogurt, 1/2 cup  Tea or coffee      LEVEL 2 = SOFT DIET  After your first 2 weeks, you can advance to a soft diet.   Keep on this diet until everything goes down easily.  Hot Foods Cold Foods  White fish Cottage cheese  Stuffed fish Junior baby fruit  Baby food meals Semi thickened juices  Minced soft cooked, scrambled, poached eggs nectars  Souffle & omelets Ripe mashed bananas  Cooked cereals Canned fruit, pineapple sauce, milk  potatoes Milkshake  Buttered or Alfredo noodles Custard  Cooked cooled vegetable Puddings, including tapioca  Sherbet Yogurt  Vegetable soup or alphabet soup Fruit ice, [place] ice  Gravies Whipped gelatin  Sugar, syrup, honey, jelly Junior baby desserts  Sauces:  Cheese, creamed, barbecue, tomato, white Cream  Coffee or tea Margarine   SAMPLE MENU:  LEVEL 2 Breakfast Lunch Dinner   Orange juice, 1/2 cup  Oatmeal, 1/2 cup  Scrambled eggs with cheese, 1/2 cup  Decaffeinated tea, 1 cup  Whole milk, 1 cup  Non-dairy creamer, 2 Tbsp  Pineapple juice, 1/2 cup  Minced beef, 3 oz  Gravy, 2 Tbsp  Mashed potatoes, 1/2 cup  Minced fresh broccoli, 1/2 cup  Applesauce, 1/2 cup  Coffee, 1 cup  [place], barley soup, 3/4 cup  Minced Hawaiian chicken, 3 oz  Mashed potatoes, 1/2 cup  Cooked spinach, 1/2 cup  Frozen yogurt, 1/2 cup  Non-dairy creamer, 2 Tbsp      LEVEL 3 = CHOPPED DIET  -After all the foods in level 2 (soft diet) are passing through well you should advance up to more chopped foods.  -It is still important to cut these foods into small pieces and eat slowly.  Hot Foods Cold Foods  Poultry Cottage cheese  Chopped Swedish meatballs Yogurt  Meat salads (ground or flaked meat) Milk  Flaked fish (tuna)  Milkshakes  Poached  or scrambled eggs Soft, cold, dry cereal  Souffles and omelets Fruit juices or nectars  Cooked cereals Chopped canned fruit  Chopped [place] toast or pancakes Canned fruit cocktail  Noodles or pasta (no rice) Pudding, mousse, custard  Cooked vegetables (no frozen peas, corn, or mixed vegetables) Green salad  Canned small sweet peas Ice cream  Creamed soup or vegetable soup Fruit ice, [place] ice  Pureed vegetable soup or alphabet soup Non-dairy creamer  Ground scalloped apples Margarine  Gravies Mayonnaise  Sauces:  Cheese, creamed, barbecue, tomato, white Ketchup  Coffee or tea Mustard   SAMPLE MENU:  LEVEL 3 Breakfast Lunch Dinner   Orange juice, 1/2 cup  Oatmeal, 1/2 cup  Scrambled eggs with cheese, 1/2 cup  Decaffeinated tea, 1 cup  Whole milk, 1 cup  Non-dairy creamer, 2 Tbsp  Ketchup, 1 Tbsp  Margarine, 1 tsp  Salt, 1/4 tsp  Sugar, 2 tsp  Pineapple juice, 1/2 cup  Ground beef, 3 oz  Gravy, 2 Tbsp  Mashed potatoes, 1/2 cup  Cooked spinach, 1/2 cup  Applesauce, 1/2 cup  Decaffeinated coffee  Whole milk  Non-dairy creamer, 2 Tbsp  Margarine, 1 tsp  Salt, 1/4 tsp  Pureed [place], barley soup, 3/4 cup  Barbecue chicken, 3 oz  Mashed potatoes, 1/2 cup  Ground fresh broccoli, 1/2 cup  Frozen yogurt, 1/2 cup  Decaffeinated tea, 1 cup  Non-dairy creamer, 2 Tbsp  Margarine, 1 tsp  Salt, 1/4 tsp  Sugar, 1 tsp    LEVEL 4:  REGULAR FOODS  -Foods in this group are soft, moist, regularly textured foods.   -This level includes meat and breads, which tend to be the hardest things to swallow.   -Eat very slowly, chew well and continue to avoid carbonated drinks. -most people are at this level in 4-6 weeks  Hot Foods Cold Foods  Baked fish or skinned Soft cheeses - cottage cheese  Souffles and omelets Cream cheese  Eggs Yogurt  Stuffed shells Milk  Spaghetti with meat sauce Milkshakes  Cooked cereal Cold dry cereals (no nuts, dried fruit,  coconut)  [place] toast or pancakes Crackers  Buttered toast Fruit juices or nectars  Noodles or pasta (no rice) Canned fruit  Potatoes (all types) Ripe bananas  Soft, cooked vegetables (no corn, lima, or baked beans) Peeled, ripe, fresh fruit  Creamed soups or vegetable soup Cakes (no nuts, dried fruit, coconut)  Canned chicken noodle soup Plain doughnuts  Gravies Ice cream  Bacon dressing Pudding, mousse, custard  Sauces:  Cheese, creamed, barbecue, tomato, white Fruit ice, [place] ice, sherbet  Decaffeinated tea or coffee Whipped gelatin  Pork chops Regular gelatin   Canned fruited gelatin molds   Sugar, syrup, honey, jam, jelly   Cream   Non-dairy   Margarine   Oil   Mayonnaise   Ketchup   Mustard   TROUBLESHOOTING IRREGULAR BOWELS  1) Avoid extremes of bowel movements (no bad constipation/diarrhea)  2) Miralax 17gm mixed in 8oz. water or juice-daily. May use BID as needed.  3) Gas-x,Phazyme, etc. as needed for gas & bloating.  4) Soft,bland diet. No spicy,greasy,fried foods.  5) Prilosec over-the-counter as needed  6) May hold gluten/wheat products from diet to see if symptoms improve.  7) May try probiotics (Align, Activa, etc) to help calm the bowels down  7) If symptoms become worse call back immediately.    If you have any questions please call our office at [place]: [phone].

## 2018-03-28 NOTE — Progress Notes (Signed)
Pt discharged from the unit via wheelchair. Discharge instructions were reviewed with the pt. No questions or concerns at this time.  Quaneshia Wareing W Grasiela Jonsson, RN 

## 2018-03-28 NOTE — Progress Notes (Signed)
Received report from Joanne, RN. I agree with previous assessment. Patient with no new complaints. Will continue to monitor closely. 

## 2018-04-08 NOTE — Anesthesia Postprocedure Evaluation (Signed)
Anesthesia Post Note  Patient: Eric Barnett  Procedure(s) Performed: XI ROBOTIC PARTIAL  GASTRECTOMY (Left Abdomen)     Patient location during evaluation: PACU Anesthesia Type: General Level of consciousness: awake and alert Pain management: pain level controlled Vital Signs Assessment: post-procedure vital signs reviewed and stable Respiratory status: spontaneous breathing, nonlabored ventilation, respiratory function stable and patient connected to nasal cannula oxygen Cardiovascular status: blood pressure returned to baseline and stable Postop Assessment: no apparent nausea or vomiting Anesthetic complications: no    Last Vitals:  Vitals:   03/28/18 0437 03/28/18 0530  BP: (!) 168/108 (!) 148/98  Pulse: 86   Resp: 20   Temp: 36.6 C   SpO2: 98%     Last Pain:  Vitals:   03/28/18 0825  TempSrc:   PainSc: 3                  Kezia Benevides

## 2018-05-07 ENCOUNTER — Telehealth: Payer: Self-pay

## 2018-05-07 ENCOUNTER — Encounter: Payer: Self-pay | Admitting: Nurse Practitioner

## 2018-05-07 ENCOUNTER — Inpatient Hospital Stay: Payer: Medicare HMO | Attending: Oncology | Admitting: Nurse Practitioner

## 2018-05-07 VITALS — BP 116/76 | HR 86 | Temp 98.0°F | Resp 18 | Ht 75.0 in | Wt 177.4 lb

## 2018-05-07 DIAGNOSIS — F1490 Cocaine use, unspecified, uncomplicated: Secondary | ICD-10-CM | POA: Diagnosis not present

## 2018-05-07 DIAGNOSIS — Z79899 Other long term (current) drug therapy: Secondary | ICD-10-CM | POA: Diagnosis not present

## 2018-05-07 DIAGNOSIS — Z886 Allergy status to analgesic agent status: Secondary | ICD-10-CM | POA: Insufficient documentation

## 2018-05-07 DIAGNOSIS — I10 Essential (primary) hypertension: Secondary | ICD-10-CM | POA: Diagnosis not present

## 2018-05-07 DIAGNOSIS — E785 Hyperlipidemia, unspecified: Secondary | ICD-10-CM | POA: Diagnosis not present

## 2018-05-07 DIAGNOSIS — F17210 Nicotine dependence, cigarettes, uncomplicated: Secondary | ICD-10-CM | POA: Insufficient documentation

## 2018-05-07 DIAGNOSIS — Z8679 Personal history of other diseases of the circulatory system: Secondary | ICD-10-CM | POA: Diagnosis not present

## 2018-05-07 DIAGNOSIS — C49A2 Gastrointestinal stromal tumor of stomach: Secondary | ICD-10-CM | POA: Insufficient documentation

## 2018-05-07 DIAGNOSIS — Z23 Encounter for immunization: Secondary | ICD-10-CM | POA: Insufficient documentation

## 2018-05-07 MED ORDER — INFLUENZA VAC SPLIT HIGH-DOSE 0.5 ML IM SUSY
0.5000 mL | PREFILLED_SYRINGE | Freq: Once | INTRAMUSCULAR | Status: AC
  Administered 2018-05-07: 0.5 mL via INTRAMUSCULAR
  Filled 2018-05-07: qty 0.5

## 2018-05-07 NOTE — Progress Notes (Signed)
Distress screen: Areas of concern for patient were lack of information about maintaining fitness, dry and itchy skin, and the most concerning was "sex". This was addressed with Dr. Benay Spice who instructed him to contact his urologist regarding this issue.

## 2018-05-07 NOTE — Telephone Encounter (Signed)
Opening error 

## 2018-05-07 NOTE — Progress Notes (Signed)
  Oncology Nurse Navigator Documentation  Navigator Location: [facility] (05/07/18 1457)  Met with patient at initial consult. Provided patient with Dr. Gearldine Shown and my contact numbers. I explained to patient the different disciplines on our  treatment team.  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 He does not have a car and relies on Medicare transportation. He will need a 4 day notice for appointments to arrange transportation. I explained how we can meet transportation needs if he has a need in the future. He said that he was not interested. Patient is also requesting future appointment be made on a Friday which is his day off. I will follow patient as needed.

## 2018-05-07 NOTE — Progress Notes (Addendum)
New Hematology/Oncology Consult   Requesting MD: Dr. Alwyn Pea  [phone]      Reason for Consult: Gastrointestinal stromal tumor  HPI: Eric Barnett is a 67 year old man noted to have an adrenal mass on CT angio of the abdomen/pelvis 08/06/2014.  The scan was done to evaluate an abdominal aortic aneurysm.  A left adrenal gland mass measuring 7.3 x 5.3 x 4.6 cm was noted as well as an approximate 2.4 x 1.9 cm right adrenal gland nodule.  There was also diffuse thickening of the rectal wall.  MRI of the abdomen 08/10/2014 showed a 2.0 x 1.8 cm right adrenal gland nodule, 5.0 x 6.6 x 5.5 cm lobulated mass in the left upper abdomen possibly arising exophytically from the left adrenal gland, 5.2 x 5.5 cm infrarenal abdominal aortic aneurysm.  He underwent repair of the abdominal aortic aneurysm on 08/30/2014.  CT renal stone study on 08/26/2017 showed a stable 2 cm right adrenal adenoma.  There was a 5.7 x 7.3 cm mass in the left upper quadrant which appeared to be arising from the left adrenal gland.  CT abdomen/pelvis on 10/12/2017 showed a 2 mm left lower lobe nodule, fluid density right adrenal nodule measuring 2.0 cm, heterogeneous mass in the left adrenal gland measuring 5.5 x 7.5 cm.  He was scheduled to undergo a left adrenalectomy by Dr. Lovena Neighbours on 01/01/2018.  Surgery was canceled due to toxicology positive for cocaine.  He was taken to the OR on 03/26/2018 by Dr. Lovena Neighbours.  Intraoperatively the mass was found to be densely adherent to the pancreas as well as the posterior aspects of the spleen.  Mass also found to be densely adherent to the inferior portion of the posterior fundus of the stomach.  Dr.Gross performed a partial gastrectomy.  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     Past Medical History:  Diagnosis Date  . AAA (abdominal aortic aneurysm) ([place])   . Hyperchloremia   . Hyperlipidemia   . Hypertension   . Irregular heartbeat   . Vitamin D deficiency   :   Past Surgical History:  Procedure Laterality Date  . ABDOMINAL AORTIC ENDOVASCULAR STENT GRAFT N/A 08/30/2014   Procedure: ABDOMINAL AORTIC ENDOVASCULAR STENT GRAFT;  Surgeon: Elam Dutch, MD;  Location: [facility];  Service: Vascular;  Laterality: N/A;  . HERNIA REPAIR     bil groins " several time"  . ROBOTIC ADRENALECTOMY     Dr. Lovena Neighbours 01-01-18  . ROBOTIC ADRENALECTOMY Left 03/26/2018   Procedure: XI ROBOTIC PARTIAL  GASTRECTOMY;  Surgeon: Ceasar Mons, MD;  Location: [facility];  Service: Urology;  Laterality: Left;  . TONSILLECTOMY    :   Current Outpatient Medications:  .  atorvastatin (LIPITOR) 40 MG tablet, Take 1 tablet (40 mg total) by mouth daily., Disp: 30 tablet, Rfl: 6 .  losartan-hydrochlorothiazide (HYZAAR) 100-25 MG tablet, Take 1 tablet by mouth every evening., Disp: , Rfl:  .  tamsulosin (FLOMAX) 0.4 MG CAPS capsule, Take 0.4 mg by mouth daily., Disp: , Rfl: :  :   Allergies  Allergen Reactions  . Aspirin Nausea Only and Other (See Comments)    HIGH DOSE  Upset patients   stomach-   :  FH: Mother is deceased.  He is not aware of the  cause of death.  Father deceased.  He thinks he may have had cancer.  He does not know his siblings.  SOCIAL HISTORY: He lives in [place] with his ex-wife.  He has a daughter.  He works at the [employer].  He smokes cigarettes occasionally.  He drinks gin 1 time per week.  He last used crack 1-1/2 months ago.  Review of Systems: He reports a good appetite.  Weight is stable.  No fevers or sweats.  He denies pain.  No unusual headaches.  No vision change.  No dysphagia.  He denies nausea/vomiting.  Bowels moving regularly.  No blood with bowel movements.  No dysuria.  He thinks he saw blood in his urine  this morning.  No shortness of breath.  No cough.  No chest pain.  He occasionally notes bilateral ankle edema.  No calf pain.  No numbness or tingling in his hands or feet.   Physical Exam:  Blood pressure 116/76, pulse 86, temperature 98 F (36.7 C), temperature source Oral, resp. rate 18, height '6\' 3"'  (1.905 m), weight 177 lb 6.4 oz (80.5 kg), SpO2 97 %.  HEENT: Pupils are small, equal.  Extraocular movements intact.  Sclera anicteric. Lungs: Lungs clear bilaterally. Cardiac: Regular rate and rhythm. Abdomen: Soft and nontender.  No hepatomegaly.  No mass.  Healed surgical incisions. Vascular: No leg edema.  Chronic stasis change. Lymph nodes: No palpable cervical or supraclavicular lymph nodes.  Shotty bilateral axillary and inguinal lymph nodes. Neurologic: Alert and oriented.  Follows commands.  Motor strength 5/5.  Knee DTRs 1+, symmetric. Skin: Abrasion right lower leg.  LABS: None  RADIOLOGY:  None   Assessment:   1. Gastric GIST  CT angio abdomen/pelvis 08/05/2017-left adrenal gland mass measuring 7.3 x 5.3 x 4.6 cm; approximate 2.4 x 1.9 cm right adrenal gland nodule; diffuse thickening of the rectal wall.    MRI of the abdomen 08/10/2014-2.0 x 1.8 cm right adrenal gland nodule, 5.0 x 6.6 x 5.5 cm lobulated mass in the left upper abdomen possibly arising exophytically from the left adrenal gland, 5.2 x 5.5 cm infrarenal abdominal aortic aneurysm.   CT renal stone study 08/26/2017-stable 2 cm right adrenal adenoma;  5.7 x 7.3 cm mass in the left upper quadrant which appeared to be arising from the left adrenal gland.    CT abdomen/pelvis on 10/12/2017-2 mm left lower lobe nodule, fluid density right adrenal nodule measuring 2.0 cm, heterogeneous mass in the left adrenal gland measuring 5.5 x 7.5 cm.    He was taken to the OR on 03/26/2018 by Dr. Lovena Neighbours.  Intraoperatively the mass was found to be densely adherent to the pancreas as well as the posterior aspects of the spleen.   The mass also found to be densely adherent to the inferior portion of the posterior fundus of the stomach.  Dr.Gross performed a partial gastrectomy.  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 2. Hypertension 3. Hypercholesterolemia 4. History of abdominal aortic aneurysm status post repair 08/30/2014 5. Health maintenance.  Influenza vaccine today.  Referred for screening colonoscopy.  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  He underwent surgical resection 03/26/2018.  Dr. Benay Spice reviewed the diagnosis, prognosis and treatment options with Eric Barnett at today's visit.  He appears to  have a good prognosis for long-term survival based on the location of the tumor and "low" mitotic rate.  Dr. Benay Spice does not recommend adjuvant therapy.  We will contact pathology to request the specific mitotic rate.  Eric Barnett' case will be presented at the upcoming GI tumor conference.  We will plan to see Eric Barnett back in approximately 9 months with a CT scan a few days prior.  He will contact the office prior to that appointment with any problems.  He received the influenza vaccine at today's visit.  We referred him for a screening colonoscopy.  He will schedule an appointment with his PCP to discuss other screening recommendations.  Patient seen with Dr. Benay Spice.   Ned Card, NP 05/07/2018, 2:35 PM   This was a shared visit with Ned Card.  Eric Barnett was interviewed and examined.  We reviewed the details of the surgical pathology report with Eric Barnett.  We reviewed the preoperative CT images.  He has been diagnosed with a gastrointestinal stromal tumor of the stomach.  He understands the prognosis is based on the tumor site, mitotic rate, and  size.  We will confirm the mitotic rate is less than 5 mitoses per 50 high-powered fields.  He has a good prognosis for a long-term disease-free survival.  We discussed the small expected absolute benefit associated with adjuvant imatinib therapy in his case.  We reviewed potential toxicities associated with imatinib.  He does not wish to receive adjuvant imatinib.  I will present his case at the GI tumor conference.  Julieanne Manson, MD

## 2018-05-07 NOTE — Telephone Encounter (Signed)
Printed avs and calender of upcoming appointment. Per 12/11 los 

## 2018-05-09 NOTE — Progress Notes (Signed)
  Oncology Nurse Navigator Documentation   Message sent to [facility] RN asking to check on referral for screening colonoscopy.

## 2018-05-29 ENCOUNTER — Encounter: Payer: Self-pay | Admitting: Gastroenterology

## 2018-06-03 ENCOUNTER — Telehealth: Payer: Self-pay | Admitting: *Deleted

## 2018-06-03 NOTE — Telephone Encounter (Signed)
Patient is scheduled for a direct screening colonoscopy with you on 06/27/2018. Patient had partial gastrectomy for tumor on 03/26/18. Please review pt's history. I just wanted to let you know and to make sure the patient is ok for direct screening colonoscopy at this time? Please advise. Thank you,Robbin pv

## 2018-06-03 NOTE — Telephone Encounter (Signed)
OV made on 06/27/2018 at 2 pm. Pt requested a Friday. Patient is aware of Dr.Armbruster's recommendations and pt is aware PV and colon cancelled. Mailed appointment letter.

## 2018-06-03 NOTE — Telephone Encounter (Signed)
Yes I would like to see the patient in the office first. He may need EGD as well. Thanks

## 2018-06-27 ENCOUNTER — Ambulatory Visit: Payer: Medicare HMO | Admitting: Gastroenterology

## 2018-06-27 ENCOUNTER — Encounter: Payer: Medicare HMO | Admitting: Gastroenterology

## 2018-07-07 ENCOUNTER — Encounter: Payer: Self-pay | Admitting: Gastroenterology

## 2018-07-31 ENCOUNTER — Encounter: Payer: Self-pay | Admitting: Gastroenterology

## 2018-07-31 ENCOUNTER — Ambulatory Visit: Payer: Medicare HMO | Admitting: Gastroenterology

## 2018-07-31 ENCOUNTER — Encounter (INDEPENDENT_AMBULATORY_CARE_PROVIDER_SITE_OTHER): Payer: Self-pay

## 2018-07-31 VITALS — BP 104/84 | HR 70 | Ht 75.0 in | Wt 178.0 lb

## 2018-07-31 DIAGNOSIS — K219 Gastro-esophageal reflux disease without esophagitis: Secondary | ICD-10-CM | POA: Diagnosis not present

## 2018-07-31 DIAGNOSIS — Z1211 Encounter for screening for malignant neoplasm of colon: Secondary | ICD-10-CM

## 2018-07-31 DIAGNOSIS — C49A2 Gastrointestinal stromal tumor of stomach: Secondary | ICD-10-CM

## 2018-07-31 MED ORDER — FAMOTIDINE 20 MG PO TABS: 20.0000 mg | ORAL_TABLET | Freq: Every day | ORAL | 1 refills | Status: DC | PRN

## 2018-07-31 NOTE — Progress Notes (Signed)
HPI :  68 year old male with a history of AAA, HLD, HTN, gastric GIST, GERD, referred by Ned Card NP for history of GIST and colon cancer screening.   The patient was noted to have a mass in the left upper quadrant in May on CT scan. Initially this was concerning for an adrenal lesion. Eventually was evaluated and underwent surgery in October. Intraoperatively the mass was found to be densely adherent to the pancreas as well as the posterior aspects of the spleen.  Mass also found to be densely adherent to the inferior portion of the posterior fundus of the stomach.  Dr.Gross performed a partial gastrectomy.  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 The patient has been followed by Oncology and no plans for further adjuvant therapy that he is aware of.  He reports he is recovered well from his surgery and is doing well without any complaints at this time. He eats well, no postprandial abdominal pain. He does have long-standing history of reflux which she's had for years. He denies any dysphagia. He states his reflux is generally well controlled with Zantac at this time. He denies much breakthrough. He states his bowel habits are pretty normal. He does not have any blood in the stools, no diarrhea, no constipation. He denies any family history of colon cancer or stomach cancer. He is never had a prior colonoscopy or colon cancer screening. Weight stable.    Past Medical History:  Diagnosis Date  . AAA (abdominal aortic aneurysm) ([place])   . Hyperchloremia   . Hyperlipidemia   . Hypertension   . Irregular heartbeat   . Vitamin D deficiency      Past Surgical History:  Procedure Laterality Date  . ABDOMINAL AORTIC ENDOVASCULAR STENT GRAFT N/A  08/30/2014   Procedure: ABDOMINAL AORTIC ENDOVASCULAR STENT GRAFT;  Surgeon: Elam Dutch, MD;  Location: [facility];  Service: Vascular;  Laterality: N/A;  . HERNIA REPAIR     bil groins " several time"  . ROBOTIC ADRENALECTOMY     Dr. Lovena Neighbours 01-01-18  . ROBOTIC ADRENALECTOMY Left 03/26/2018   Procedure: XI ROBOTIC PARTIAL  GASTRECTOMY;  Surgeon: Ceasar Mons, MD;  Location: [facility];  Service: Urology;  Laterality: Left;  . TONSILLECTOMY     No family history on file. Social History   Tobacco Use  . Smoking status: Current Every Day Smoker    Packs/day: 0.25    Years: 17.00    Pack years: 4.25    Types: Cigarettes  . Smokeless tobacco: Former Systems developer    Types: Chew    Quit date: 01/20/1974  . Tobacco comment: 3-4 cigarettes per day  Substance Use Topics  . Alcohol use: Not Currently    Alcohol/week: 1.0 standard drinks    Types: 1 Standard drinks or equivalent per week    Comment: 1/2 pint per week  . Drug use: Not Currently    Types: Marijuana, Cocaine    Comment: none in a long time per pt.   Current Outpatient Medications  Medication Sig Dispense Refill  . atorvastatin (LIPITOR) 40 MG tablet Take 1 tablet (40 mg total) by mouth daily. 30 tablet 6  . losartan-hydrochlorothiazide (HYZAAR) 100-25 MG tablet Take 1 tablet by mouth every evening.    . ranitidine (ZANTAC) 150 MG tablet  Take 150 mg by mouth as needed for heartburn.    . tamsulosin (FLOMAX) 0.4 MG CAPS capsule Take 0.4 mg by mouth daily.     No current facility-administered medications for this visit.    Allergies  Allergen Reactions  . Aspirin Nausea Only and Other (See Comments)    HIGH DOSE  Upset patients   stomach-      Review of Systems: All systems reviewed and negative except where noted in HPI.   Lab Results  Component Value Date   WBC 3.9 (L) 03/14/2018   HGB 13.1 03/27/2018   HCT 39.0 03/27/2018   MCV 96.3 03/14/2018   PLT 161 03/14/2018    Lab Results  Component Value Date    CREATININE 1.16 03/27/2018   BUN 17 03/27/2018   NA 139 03/27/2018   K 3.8 03/27/2018   CL 104 03/27/2018   CO2 28 03/27/2018    Lab Results  Component Value Date   ALT 20 12/21/2016   AST 38 12/21/2016   ALKPHOS 68 12/21/2016   BILITOT 1.1 12/21/2016     Physical Exam: BP 104/84   Pulse 70   Ht '6\' 3"'  (1.905 m)   Wt 178 lb (80.7 kg)   BMI 22.25 kg/m  Constitutional: Pleasant,well-developed, male in no acute distress. HEENT: Normocephalic and atraumatic. Conjunctivae are normal. No scleral icterus. Neck supple.  Cardiovascular: Normal rate, regular rhythm.  Pulmonary/chest: Effort normal and breath sounds normal. No wheezing, rales or rhonchi. Abdominal: Soft, nondistended, nontender.  There are no masses palpable. No hepatomegaly. Extremities: no edema Lymphadenopathy: No cervical adenopathy noted. Neurological: Alert and oriented to person place and time. Skin: Skin is warm and dry. No rashes noted. Psychiatric: Normal mood and affect. Behavior is normal.   ASSESSMENT AND PLAN: 68 year old male here for new patient assessment of the following:  Gastric GIST / GERD - he's had a complete resection of the gastric GIST, no further plans for adjuvant therapy at this time. He does have chronic reflux symptoms which he's had for years and a history of tobacco use. Symptoms are pretty well controlled with Zantac. I discussed with him that FDA recall on Zantac due to a containing trace levels of [place]. I recommend he stop Zantac and will switch to Pepcid 20 mg once a day. Otherwise given his chronic reflux symptoms and tobacco use, I offered him a screening endoscopy for Barrett's esophagus. Following discussion of risks / benefits he wished to proceed. Further recommendations pending the results.   Colon cancer screening - asymptomatic, overdue for first time screening. I discussed options with him. Recommend optical colonoscopy, following discussion of risks and benefits he wanted  to proceed. Further recommendations pending the results.   Cellar, MD [facility]  CC:  Ned Card NP

## 2018-07-31 NOTE — Patient Instructions (Addendum)
If you are age 69 or older, your body mass index should be between 23-30. Your Body mass index is 22.25 kg/m. If this is out of the aforementioned range listed, please consider follow up with your Primary Care Provider.  If you are age 69 or younger, your body mass index should be between 19-25. Your Body mass index is 22.25 kg/m. If this is out of the aformentioned range listed, please consider follow up with your Primary Care Provider.   You have been scheduled for an endoscopy and colonoscopy. Please follow the written instructions given to you at your visit today. Please pick up your prep supplies at the pharmacy within the next 1-3 days. If you use inhalers (even only as needed), please bring them with you on the day of your procedure. Your physician has requested that you go to www.startemmi.com and enter the access code given to you at your visit today. This web site gives a general overview about your procedure. However, you should still follow specific instructions given to you by our office regarding your preparation for the procedure.  We are giving you a sample of Plenvu to use for your prep.   We have sent the following medications to your pharmacy for you to pick up at your convenience: Pepcid 20mg : Take once a day as needed   Discontinue Zantac.  Thank you for entrusting me with your care and for choosing [facility], Dr. [place] Cellar

## 2018-08-26 ENCOUNTER — Telehealth: Payer: Self-pay | Admitting: *Deleted

## 2018-08-26 NOTE — Telephone Encounter (Signed)
Spoke with patient.  Advised him his 4/9 procedure is cancelled d/t covid restrictions. Informed him that we would call back to reschedule.

## 2018-09-04 ENCOUNTER — Encounter: Payer: Medicare HMO | Admitting: Gastroenterology

## 2018-10-07 ENCOUNTER — Telehealth: Payer: Self-pay | Admitting: *Deleted

## 2018-10-07 NOTE — Telephone Encounter (Signed)
Called pt, no answer, left message for pt to call us back to reschedule ECL w/Dr.Armbruster.

## 2018-10-07 NOTE — Telephone Encounter (Signed)
Spoke with patient. Explained that we need to reschedule ECL, pt request we call him tomorrow morning about 8 am, he is unable to make the appointment at this time.

## 2018-10-08 ENCOUNTER — Encounter: Payer: Self-pay | Admitting: Gastroenterology

## 2018-10-13 NOTE — Telephone Encounter (Signed)
New instructions mailed to patient today.

## 2018-10-27 ENCOUNTER — Other Ambulatory Visit: Payer: Self-pay | Admitting: Gastroenterology

## 2018-11-20 ENCOUNTER — Telehealth: Payer: Self-pay | Admitting: Gastroenterology

## 2018-11-20 NOTE — Telephone Encounter (Signed)
Left message to call back to ask Covid-19 screening questions. Covid-19 Screening Questions:  Do you now or have you had a fever in the last 14 days? no  Do you have any respiratory symptoms of shortness of breath or cough now or in the last 14 days? no  Do you have any family members or close contacts with diagnosed or suspected Covid-19 in the past 14 days? no  Have you been tested for Covid-19 and found to be positive? no

## 2018-11-21 ENCOUNTER — Telehealth: Payer: Self-pay | Admitting: Gastroenterology

## 2018-11-21 ENCOUNTER — Other Ambulatory Visit: Payer: Self-pay | Admitting: Internal Medicine

## 2018-11-21 ENCOUNTER — Encounter: Payer: Medicare HMO | Admitting: Gastroenterology

## 2018-11-21 DIAGNOSIS — Z20822 Contact with and (suspected) exposure to covid-19: Secondary | ICD-10-CM

## 2018-11-21 NOTE — Telephone Encounter (Signed)
Dr. Armbruster notified. 

## 2018-11-21 NOTE — Telephone Encounter (Signed)
Pt was a no show to his EGD/colon today and had to reschedule to July 31.  Pt requested new instructions mailed to his house.

## 2018-11-24 LAB — NOVEL CORONAVIRUS, NAA: SARS-CoV-2, NAA: NOT DETECTED

## 2018-11-24 NOTE — Telephone Encounter (Signed)
New instructions for 7-31 procedure mailed to pt with Acknowledgement to be returned.

## 2018-11-27 ENCOUNTER — Telehealth: Payer: Self-pay

## 2018-11-27 NOTE — Telephone Encounter (Signed)
Patient informed of test results. Expressed understanding.

## 2018-12-25 ENCOUNTER — Telehealth: Payer: Self-pay | Admitting: Gastroenterology

## 2018-12-25 NOTE — Telephone Encounter (Signed)
Left message on Vmail for patient regarding Covid-19 screening questions. Covid-19 Screening Questions:   Do you now or have you had a fever in the last 14 days?    Do you have any respiratory symptoms of shortness of breath or cough now or in the last 14 days?    Do you have any family members or close contacts with diagnosed or suspected Covid-19 in the past 14 days?    Have you been tested for Covid-19 and found to be positive?

## 2018-12-26 ENCOUNTER — Encounter: Payer: Self-pay | Admitting: Gastroenterology

## 2018-12-26 ENCOUNTER — Ambulatory Visit (AMBULATORY_SURGERY_CENTER): Payer: Medicare HMO | Admitting: Gastroenterology

## 2018-12-26 ENCOUNTER — Other Ambulatory Visit: Payer: Self-pay

## 2018-12-26 VITALS — BP 132/79 | HR 75 | Temp 98.2°F | Resp 16 | Ht 75.0 in | Wt 178.0 lb

## 2018-12-26 DIAGNOSIS — D122 Benign neoplasm of ascending colon: Secondary | ICD-10-CM

## 2018-12-26 DIAGNOSIS — D128 Benign neoplasm of rectum: Secondary | ICD-10-CM

## 2018-12-26 DIAGNOSIS — K259 Gastric ulcer, unspecified as acute or chronic, without hemorrhage or perforation: Secondary | ICD-10-CM

## 2018-12-26 DIAGNOSIS — D125 Benign neoplasm of sigmoid colon: Secondary | ICD-10-CM | POA: Diagnosis not present

## 2018-12-26 DIAGNOSIS — K3189 Other diseases of stomach and duodenum: Secondary | ICD-10-CM | POA: Diagnosis not present

## 2018-12-26 DIAGNOSIS — Z1211 Encounter for screening for malignant neoplasm of colon: Secondary | ICD-10-CM | POA: Diagnosis not present

## 2018-12-26 DIAGNOSIS — K621 Rectal polyp: Secondary | ICD-10-CM | POA: Diagnosis not present

## 2018-12-26 DIAGNOSIS — K219 Gastro-esophageal reflux disease without esophagitis: Secondary | ICD-10-CM | POA: Diagnosis not present

## 2018-12-26 DIAGNOSIS — K2970 Gastritis, unspecified, without bleeding: Secondary | ICD-10-CM

## 2018-12-26 DIAGNOSIS — B9681 Helicobacter pylori [H. pylori] as the cause of diseases classified elsewhere: Secondary | ICD-10-CM

## 2018-12-26 DIAGNOSIS — K2950 Unspecified chronic gastritis without bleeding: Secondary | ICD-10-CM | POA: Diagnosis not present

## 2018-12-26 DIAGNOSIS — K449 Diaphragmatic hernia without obstruction or gangrene: Secondary | ICD-10-CM

## 2018-12-26 MED ORDER — OMEPRAZOLE 40 MG PO CPDR: 40.0000 mg | DELAYED_RELEASE_CAPSULE | Freq: Every day | ORAL | 3 refills | Status: DC

## 2018-12-26 MED ORDER — SODIUM CHLORIDE 0.9 % IV SOLN: 500.0000 mL | Freq: Once | INTRAVENOUS | Status: DC

## 2018-12-26 NOTE — Progress Notes (Signed)
Called to room to assist during endoscopic procedure.  Patient ID and intended procedure confirmed with present staff. Received instructions for my participation in the procedure from the performing physician.  

## 2018-12-26 NOTE — Op Note (Signed)
[facility] Patient Name: Eric Barnett Procedure Date: 12/26/2018 2:35 PM MRN: [id number] Endoscopist: Remo Lipps P. Havery Moros , MD Age: 68 Referring MD:  Date of Birth: April 01, 1951 Gender: Male Account #: [id number] Procedure:                Colonoscopy Indications:              Screening for colorectal malignant neoplasm, This                            is the patient's first colonoscopy Medicines:                Monitored Anesthesia Care Procedure:                Pre-Anesthesia Assessment:                           - Prior to the procedure, a History and Physical                            was performed, and patient medications and                            allergies were reviewed. The patient's tolerance of                            previous anesthesia was also reviewed. The risks                            and benefits of the procedure and the sedation                            options and risks were discussed with the patient.                            All questions were answered, and informed consent                            was obtained. Prior Anticoagulants: The patient has                            taken no previous anticoagulant or antiplatelet                            agents. ASA Grade Assessment: II - A patient with                            mild systemic disease. After reviewing the risks                            and benefits, the patient was deemed in                            satisfactory condition to undergo the procedure.  After obtaining informed consent, the colonoscope                            was passed under direct vision. Throughout the                            procedure, the patient's blood pressure, pulse, and                            oxygen saturations were monitored continuously. The                            Colonoscope was introduced through the anus and                            advanced to the the  cecum, identified by                            appendiceal orifice and ileocecal valve. The                            colonoscopy was performed without difficulty. The                            patient tolerated the procedure well. The quality                            of the bowel preparation was good. The ileocecal                            valve, appendiceal orifice, and rectum were                            photographed. Scope In: 2:59:32 PM Scope Out: 3:27:05 PM Scope Withdrawal Time: 0 hours 18 minutes 37 seconds  Total Procedure Duration: 0 hours 27 minutes 33 seconds  Findings:                 The perianal and digital rectal examinations were                            normal.                           Five sessile polyps were found in the rectum. The                            polyps were 3 to 4 mm in size. These polyps were                            removed with a cold snare. Resection and retrieval                            were complete.  Two sessile polyps were found in the sigmoid colon.                            The polyps were 3 to 5 mm in size. These polyps                            were removed with a cold snare. Resection and                            retrieval were complete.                           Two sessile polyps were found in the ascending                            colon. The polyps were 3 mm in size. These polyps                            were removed with a cold snare. Resection and                            retrieval were complete.                           Scattered medium-mouthed diverticula were found in                            the entire colon.                           Internal hemorrhoids were found during retroflexion.                           The colon was extremely tortuous which prolonged                            the exam.                           The exam was otherwise without  abnormality. Complications:            No immediate complications. Estimated blood loss:                            Minimal. Estimated Blood Loss:     Estimated blood loss was minimal. Impression:               - Five 3 to 4 mm polyps in the rectum, removed with                            a cold snare. Resected and retrieved.                           - Two 3 to 5 mm polyps in the sigmoid colon,  removed with a cold snare. Resected and retrieved.                           - Two 3 mm polyps in the ascending colon, removed                            with a cold snare. Resected and retrieved.                           - Diverticulosis in the entire examined colon.                           - Internal hemorrhoids.                           - Tortuous colon.                           - The examination was otherwise normal. Recommendation:           - Patient has a contact number available for                            emergencies. The signs and symptoms of potential                            delayed complications were discussed with the                            patient. Return to normal activities tomorrow.                            Written discharge instructions were provided to the                            patient.                           - Resume previous diet.                           - Continue present medications.                           - Await pathology results. Remo Lipps P. Rashawnda Gaba, MD 12/26/2018 3:33:45 PM This report has been signed electronically.

## 2018-12-26 NOTE — Patient Instructions (Signed)
Impression/Recommendations:  Hiatal hernia handout given to patient. Polyp handout given to patient. Diverticulosis handout given to patient. Hemorrhoid handout given to patient.  Resume previous diet. Continue present medications.  Stop Pepcid.  Start Omeprazole 40 mg. Once daily for treatment of gastric ulcer.  Minimize NSAID use.  Await pathology results.  YOU HAD AN ENDOSCOPIC PROCEDURE TODAY AT [facility]:   Refer to the procedure report that was given to you for any specific questions about what was found during the examination.  If the procedure report does not answer your questions, please call your gastroenterologist to clarify.  If you requested that your care partner not be given the details of your procedure findings, then the procedure report has been included in a sealed envelope for you to review at your convenience later.  YOU SHOULD EXPECT: Some feelings of bloating in the abdomen. Passage of more gas than usual.  Walking can help get rid of the air that was put into your GI tract during the procedure and reduce the bloating. If you had a lower endoscopy (such as a colonoscopy or flexible sigmoidoscopy) you may notice spotting of blood in your stool or on the toilet paper. If you underwent a bowel prep for your procedure, you may not have a normal bowel movement for a few days.  Please Note:  You might notice some irritation and congestion in your nose or some drainage.  This is from the oxygen used during your procedure.  There is no need for concern and it should clear up in a day or so.  SYMPTOMS TO REPORT IMMEDIATELY:   Following lower endoscopy (colonoscopy or flexible sigmoidoscopy):  Excessive amounts of blood in the stool  Significant tenderness or worsening of abdominal pains  Swelling of the abdomen that is new, acute  Fever of 100F or higher   Following upper endoscopy (EGD)  Vomiting of blood or coffee ground material  New chest pain or  pain under the shoulder blades  Painful or persistently difficult swallowing  New shortness of breath  Fever of 100F or higher  Black, tarry-looking stools  For urgent or emergent issues, a gastroenterologist can be reached at any hour by calling [phone].   DIET:  We do recommend a small meal at first, but then you may proceed to your regular diet.  Drink plenty of fluids but you should avoid alcoholic beverages for 24 hours.  ACTIVITY:  You should plan to take it easy for the rest of today and you should NOT DRIVE or use heavy machinery until tomorrow (because of the sedation medicines used during the test).    FOLLOW UP: Our staff will call the number listed on your records 48-72 hours following your procedure to check on you and address any questions or concerns that you may have regarding the information given to you following your procedure. If we do not reach you, we will leave a message.  We will attempt to reach you two times.  During this call, we will ask if you have developed any symptoms of COVID 19. If you develop any symptoms (ie: fever, flu-like symptoms, shortness of breath, cough etc.) before then, please call [phone].  If you test positive for Covid 19 in the 2 weeks post procedure, please call and report this information to [place].    If any biopsies were taken you will be contacted by phone or by letter within the next 1-3 weeks.  Please call us at [phone]  if you have not heard about the biopsies in 3 weeks.    SIGNATURES/CONFIDENTIALITY: You and/or your care partner have signed paperwork which will be entered into your electronic medical record.  These signatures attest to the fact that that the information above on your After Visit Summary has been reviewed and is understood.  Full responsibility of the confidentiality of this discharge information lies with you and/or your care-partner.

## 2018-12-26 NOTE — Progress Notes (Signed)
Temp PJ Vitals CW

## 2018-12-26 NOTE — Op Note (Signed)
[facility] Patient Name: Eric Barnett Procedure Date: 12/26/2018 2:35 PM MRN: [id number] Endoscopist: Remo Lipps P. Havery Moros , MD Age: 68 Referring MD:  Date of Birth: 25-Sep-1950 Gender: Male Account #: [id number] Procedure:                Upper GI endoscopy Indications:              Screening for Barrett's esophagus - history of                            reflux, history of gastric GIST, on Pepcid Medicines:                Monitored Anesthesia Care Procedure:                Pre-Anesthesia Assessment:                           - Prior to the procedure, a History and Physical                            was performed, and patient medications and                            allergies were reviewed. The patient's tolerance of                            previous anesthesia was also reviewed. The risks                            and benefits of the procedure and the sedation                            options and risks were discussed with the patient.                            All questions were answered, and informed consent                            was obtained. Prior Anticoagulants: The patient has                            taken no previous anticoagulant or antiplatelet                            agents. ASA Grade Assessment: II - A patient with                            mild systemic disease. After reviewing the risks                            and benefits, the patient was deemed in                            satisfactory condition to undergo the procedure.  After obtaining informed consent, the endoscope was                            passed under direct vision. Throughout the                            procedure, the patient's blood pressure, pulse, and                            oxygen saturations were monitored continuously. The                            Endoscope was introduced through the mouth, and                            advanced to  the second part of duodenum. The upper                            GI endoscopy was accomplished without difficulty.                            The patient tolerated the procedure well. Scope In: Scope Out: Findings:                 A 2 cm hiatal hernia was present. DH at 47cm from                            the incisors, GEJ and SCJ located 45cm from the                            incisors.                           The exam of the esophagus was otherwise normal. No                            Barrett's                           A medium-sized paraesophageal hernia was found on                            retroflexion.                           One non-bleeding superficial gastric ulcer with no                            stigmata of bleeding was found on the lesser                            curvature of the gastric body. The lesion was 3 mm                            in largest dimension. Biopsies  were taken with a                            cold forceps for histology from the periphery of it.                           Patchy erythematous mucosa was found in the gastric                            body and in the gastric antrum. Biopsies were taken                            with a cold forceps for Helicobacter pylori testing.                           The exam of the stomach was otherwise normal.                           The duodenal bulb and second portion of the                            duodenum were normal. Complications:            No immediate complications. Estimated blood loss:                            Minimal. Estimated Blood Loss:     Estimated blood loss was minimal. Impression:               - 2 cm hiatal hernia.                           - Medium-sized paraesophageal hernia.                           - Normal esophagus - no Barrett's                           - Non-bleeding gastric ulcer with no stigmata of                            bleeding. Biopsied.                            - Erythematous mucosa in the gastric body and                            antrum. Biopsied.                           - Normal stomach otherwise                           - Normal duodenal bulb and second portion of the  duodenum. Recommendation:           - Patient has a contact number available for                            emergencies. The signs and symptoms of potential                            delayed complications were discussed with the                            patient. Return to normal activities tomorrow.                            Written discharge instructions were provided to the                            patient.                           - Resume previous diet.                           - Continue present medications.                           - Stop pepcid, start omeprazole 40mg  once daily for                            treatment of gastric ulcer. Minimize NSAID use                           - Await pathology results. Remo Lipps P. Armbruster, MD 12/26/2018 3:41:38 PM This report has been signed electronically.

## 2018-12-26 NOTE — Progress Notes (Signed)
To PACU, VSS. Report to [email or web address] 

## 2018-12-30 ENCOUNTER — Telehealth: Payer: Self-pay

## 2018-12-30 NOTE — Telephone Encounter (Signed)
  Follow up Call-  Call back number 12/26/2018  Post procedure Call Back phone  # [phone]  Permission to leave phone message Yes  Some recent data might be hidden     Patient questions:  Do you have a fever, pain , or abdominal swelling? No. Pain Score  0 *  Have you tolerated food without any problems? Yes.    Have you been able to return to your normal activities? Yes.    Do you have any questions about your discharge instructions: Diet   No. Medications  No. Follow up visit  No.  Do you have questions or concerns about your Care? No.  Actions: * If pain score is 4 or above: No action needed, pain <4.

## 2019-01-05 ENCOUNTER — Other Ambulatory Visit: Payer: Self-pay

## 2019-01-05 ENCOUNTER — Telehealth: Payer: Self-pay | Admitting: Gastroenterology

## 2019-01-05 DIAGNOSIS — A048 Other specified bacterial intestinal infections: Secondary | ICD-10-CM

## 2019-01-05 MED ORDER — AMOXICILLIN 500 MG PO CAPS: 1000.0000 mg | ORAL_CAPSULE | Freq: Two times a day (BID) | ORAL | 0 refills | Status: DC

## 2019-01-05 MED ORDER — METRONIDAZOLE 500 MG PO TABS: 500.0000 mg | ORAL_TABLET | Freq: Two times a day (BID) | ORAL | 0 refills | Status: DC

## 2019-01-05 MED ORDER — CLARITHROMYCIN 500 MG PO TABS: 500.0000 mg | ORAL_TABLET | Freq: Two times a day (BID) | ORAL | 0 refills | Status: DC

## 2019-01-05 MED ORDER — OMEPRAZOLE 40 MG PO CPDR: 40.0000 mg | DELAYED_RELEASE_CAPSULE | Freq: Two times a day (BID) | ORAL | 0 refills | Status: DC

## 2019-01-05 NOTE — Telephone Encounter (Signed)
Pt called stating that his insurance does not cover any of the prescriptions for H-pylori, he wants to know what other meds Dr. Havery Moros could prescribe.

## 2019-01-06 NOTE — Telephone Encounter (Signed)
[place] - his insurance has expired. He needs to give them his updated information and it will not be expensive.  Called pt. He switched insurance but doesn't have the new information yet. He understands he needs to get that new information to the pharmacy so they can rerun the scripts and it should not be expensive.  He says he gets paid on Thursday; the premium gets automatically deducted.  He know to call us back if there is anything else we can do to help.

## 2019-01-19 ENCOUNTER — Telehealth: Payer: Self-pay | Admitting: Oncology

## 2019-01-19 NOTE — Telephone Encounter (Signed)
R/s appt per 8/21 sch message - pt aware of apt date and time

## 2019-01-27 ENCOUNTER — Telehealth: Payer: Self-pay | Admitting: Oncology

## 2019-01-27 NOTE — Telephone Encounter (Signed)
[facility] moved apointment from 9/25 to 9/15. Left message. Schedule mailed.

## 2019-01-27 NOTE — Telephone Encounter (Signed)
Patient returned call and confirmed new appointment for 9/15 and lab/ct for 9/11.

## 2019-02-04 ENCOUNTER — Telehealth: Payer: Self-pay

## 2019-02-04 NOTE — Telephone Encounter (Signed)
Called patient and asked him to stop his omeprazole for 2 weeks and then to come in 02/19/19 for a repeat stool test. Patient states he was already off his PPI. Agrees to come in for his stool test between 7:30am-4:30pm at our lab on 9/24/20or 02/20/19

## 2019-02-04 NOTE — Telephone Encounter (Signed)
-----   Message from Eric Closs, RN sent at 01/05/2019  1:28 PM EDT ----- Call patient and have him hold his PPI=02/05/19( for stool test = 02/19/19)

## 2019-02-05 ENCOUNTER — Other Ambulatory Visit: Payer: Self-pay | Admitting: *Deleted

## 2019-02-05 DIAGNOSIS — C49A2 Gastrointestinal stromal tumor of stomach: Secondary | ICD-10-CM

## 2019-02-06 ENCOUNTER — Ambulatory Visit (HOSPITAL_COMMUNITY)
Admission: RE | Admit: 2019-02-06 | Discharge: 2019-02-06 | Disposition: A | Discharge status: Home / Self Care | Payer: Medicare Other | Source: Ambulatory Visit | Attending: Nurse Practitioner | Admitting: Nurse Practitioner

## 2019-02-06 ENCOUNTER — Inpatient Hospital Stay: Payer: Medicare Other | Attending: Oncology

## 2019-02-06 ENCOUNTER — Other Ambulatory Visit: Payer: Self-pay

## 2019-02-06 DIAGNOSIS — F17210 Nicotine dependence, cigarettes, uncomplicated: Secondary | ICD-10-CM | POA: Diagnosis not present

## 2019-02-06 DIAGNOSIS — E7800 Pure hypercholesterolemia, unspecified: Secondary | ICD-10-CM | POA: Diagnosis not present

## 2019-02-06 DIAGNOSIS — C49A2 Gastrointestinal stromal tumor of stomach: Secondary | ICD-10-CM | POA: Insufficient documentation

## 2019-02-06 DIAGNOSIS — E785 Hyperlipidemia, unspecified: Secondary | ICD-10-CM | POA: Diagnosis not present

## 2019-02-06 DIAGNOSIS — I10 Essential (primary) hypertension: Secondary | ICD-10-CM | POA: Insufficient documentation

## 2019-02-06 LAB — CMP (CANCER CENTER ONLY)
ALT: 15 U/L (ref 0–44)
AST: 19 U/L (ref 15–41)
Albumin: 4 g/dL (ref 3.5–5.0)
Alkaline Phosphatase: 74 U/L (ref 38–126)
Anion gap: 7 (ref 5–15)
BUN: 16 mg/dL (ref 8–23)
CO2: 29 mmol/L (ref 22–32)
Calcium: 9.9 mg/dL (ref 8.9–10.3)
Chloride: 106 mmol/L (ref 98–111)
Creatinine: 1.22 mg/dL (ref 0.61–1.24)
GFR, Est AFR Am: >60 mL/min (ref >60)
GFR, Estimated: >60 mL/min (ref >60)
Glucose, Bld: 100 mg/dL — ABNORMAL HIGH (ref 70–99)
Potassium: 3.6 mmol/L (ref 3.5–5.1)
Sodium: 142 mmol/L (ref 135–145)
Total Bilirubin: 0.4 mg/dL (ref 0.3–1.2)
Total Protein: 7 g/dL (ref 6.5–8.1)

## 2019-02-06 MED ORDER — IOHEXOL 300 MG/ML  SOLN
100.0000 mL | Freq: Once | INTRAMUSCULAR | Status: AC | PRN
  Administered 2019-02-06: 14:00:00 100 mL via INTRAVENOUS

## 2019-02-06 MED ORDER — SODIUM CHLORIDE (PF) 0.9 % IJ SOLN
INTRAMUSCULAR | Status: AC
  Filled 2019-02-06: qty 50

## 2019-02-10 ENCOUNTER — Other Ambulatory Visit: Payer: Medicare Other

## 2019-02-10 ENCOUNTER — Inpatient Hospital Stay (HOSPITAL_BASED_OUTPATIENT_CLINIC_OR_DEPARTMENT_OTHER): Payer: Medicare Other | Admitting: Oncology

## 2019-02-10 ENCOUNTER — Other Ambulatory Visit: Payer: Self-pay

## 2019-02-10 VITALS — BP 138/87 | HR 78 | Temp 98.0°F | Resp 18 | Ht 75.0 in | Wt 168.1 lb

## 2019-02-10 DIAGNOSIS — C49A2 Gastrointestinal stromal tumor of stomach: Secondary | ICD-10-CM | POA: Diagnosis not present

## 2019-02-10 DIAGNOSIS — A048 Other specified bacterial intestinal infections: Secondary | ICD-10-CM

## 2019-02-10 NOTE — Progress Notes (Signed)
[name] OFFICE PROGRESS NOTE   Diagnosis: Gastrointestinal stromal tumor  INTERVAL HISTORY:   Eric Barnett returns as scheduled.  He feels well.  He reports a good appetite.  He relates weight loss to a change in his diet. He underwent an upper endoscopy and colonoscopy on 12/26/2018.  He was found to have a nonbleeding gastric ulcer.  A biopsy confirmed H. pylori.  No dysplasia or malignancy.  Colon polyps returned as hyperplastic polyps and tubular adenomas. He completed treatment for H. Pylori.  He has noted abdominal "hernia ".  Objective:  Vital signs in last 24 hours:  Blood pressure 138/87, pulse 78, temperature 98 F (36.7 C), temperature source Oral, resp. rate 18, height '6\' 3"'  (1.905 m), weight 168 lb 1.6 oz (76.2 kg), SpO2 100 %.    Limited physical examination secondary to distancing with the COVID pandemic Lymphatics: No cervical or supraclavicular nodes.  "Shotty "bilateral axillary and inguinal nodes GI: No mass, no hepatosplenomegaly, nontender, reducible ventral hernia Vascular: No leg edema   Lab Results:  Lab Results  Component Value Date   WBC 3.9 (L) 03/14/2018   HGB 13.1 03/27/2018   HCT 39.0 03/27/2018   MCV 96.3 03/14/2018   PLT 161 03/14/2018   NEUTROABS 2.0 12/15/2009    CMP  Lab Results  Component Value Date   NA 142 02/06/2019   K 3.6 02/06/2019   CL 106 02/06/2019   CO2 29 02/06/2019   GLUCOSE 100 (H) 02/06/2019   BUN 16 02/06/2019   CREATININE 1.22 02/06/2019   CALCIUM 9.9 02/06/2019   PROT 7.0 02/06/2019   ALBUMIN 4.0 02/06/2019   AST 19 02/06/2019   ALT 15 02/06/2019   ALKPHOS 74 02/06/2019   BILITOT 0.4 02/06/2019   GFRNONAA >60 02/06/2019   GFRAA >60 02/06/2019    Medications: I have reviewed the patient's current medications.   Assessment/Plan: 1. Gastric GIST  CT angio abdomen/pelvis 08/05/2017-left adrenal gland mass measuring 7.3 x 5.3 x 4.6 cm; approximate 2.4 x 1.9 cm right adrenal gland nodule;  diffuse thickening of the rectal wall.    MRI of the abdomen 08/10/2014-2.0 x 1.8 cm right adrenal gland nodule, 5.0 x 6.6 x 5.5 cm lobulated mass in the left upper abdomen possibly arising exophytically from the left adrenal gland, 5.2 x 5.5 cm infrarenal abdominal aortic aneurysm.   CT renal stone study 08/26/2017-stable 2 cm right adrenal adenoma;  5.7 x 7.3 cm mass in the left upper quadrant which appeared to be arising from the left adrenal gland.    CT abdomen/pelvis on 10/12/2017-2 mm left lower lobe nodule, fluid density right adrenal nodule measuring 2.0 cm, heterogeneous mass in the left adrenal gland measuring 5.5 x 7.5 cm.    He was taken to the OR on 03/26/2018 by Dr. Lovena Neighbours.  Intraoperatively the mass was found to be densely adherent to the pancreas as well as the posterior aspects of the spleen.  The mass also found to be densely adherent to the inferior portion of the posterior fundus of the stomach.  Dr.Gross performed a partial gastrectomy.  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  CT abdomen/pelvis 02/06/2019- no evidence of recurrent disease 2. Hypertension 3. Hypercholesterolemia 4. History of abdominal aortic aneurysm status post repair 08/30/2014 5. Health maintenance.  Influenza vaccine today.  Referred for screening colonoscopy. 6. Upper endoscopy 12/26/2018-nonbleeding gastric ulcer, biopsy revealed H. pylori, treated for H. Pylori 7. Multiple polyps on colonoscopy 12/26/2018-hyperplastic polyps and tubular adenomas    Disposition: Eric Barnett is in clinical remission from the gastrointestinal stromal tumor.  He will return for an office visit in 6 months.  We will plan for a restaging CT evaluation in 1 year.  He will continue  follow-up with Dr. Havery Moros for management of H. pylori and cancer surveillance.  Eric Coder, MD  02/10/2019  3:51 PM

## 2019-02-13 ENCOUNTER — Other Ambulatory Visit: Payer: Medicare HMO

## 2019-02-17 ENCOUNTER — Other Ambulatory Visit: Payer: Medicare Other

## 2019-02-17 DIAGNOSIS — A048 Other specified bacterial intestinal infections: Secondary | ICD-10-CM

## 2019-02-18 LAB — HELICOBACTER PYLORI  SPECIAL ANTIGEN
MICRO NUMBER:: 908669
SPECIMEN QUALITY: ADEQUATE

## 2019-02-19 ENCOUNTER — Telehealth: Payer: Self-pay | Admitting: Gastroenterology

## 2019-02-19 NOTE — Telephone Encounter (Signed)
See phone note

## 2019-02-19 NOTE — Telephone Encounter (Signed)
Pt stated that he is returning a call regarding test results.

## 2019-02-20 ENCOUNTER — Ambulatory Visit: Payer: Medicare HMO | Admitting: Oncology

## 2019-04-08 ENCOUNTER — Encounter: Payer: Self-pay | Admitting: Gastroenterology

## 2019-04-08 ENCOUNTER — Other Ambulatory Visit: Payer: Self-pay

## 2019-04-08 ENCOUNTER — Ambulatory Visit (INDEPENDENT_AMBULATORY_CARE_PROVIDER_SITE_OTHER): Payer: Medicare Other

## 2019-04-08 ENCOUNTER — Ambulatory Visit (INDEPENDENT_AMBULATORY_CARE_PROVIDER_SITE_OTHER): Payer: Medicare Other | Admitting: Gastroenterology

## 2019-04-08 VITALS — BP 112/70 | HR 78 | Temp 97.6°F | Ht 75.0 in | Wt 179.4 lb

## 2019-04-08 DIAGNOSIS — K3189 Other diseases of stomach and duodenum: Secondary | ICD-10-CM

## 2019-04-08 DIAGNOSIS — K259 Gastric ulcer, unspecified as acute or chronic, without hemorrhage or perforation: Secondary | ICD-10-CM | POA: Diagnosis not present

## 2019-04-08 DIAGNOSIS — Z1159 Encounter for screening for other viral diseases: Secondary | ICD-10-CM

## 2019-04-08 DIAGNOSIS — K219 Gastro-esophageal reflux disease without esophagitis: Secondary | ICD-10-CM

## 2019-04-08 DIAGNOSIS — Z8619 Personal history of other infectious and parasitic diseases: Secondary | ICD-10-CM | POA: Diagnosis not present

## 2019-04-08 DIAGNOSIS — Z86010 Personal history of colonic polyps: Secondary | ICD-10-CM

## 2019-04-08 DIAGNOSIS — K31A0 Gastric intestinal metaplasia, unspecified: Secondary | ICD-10-CM

## 2019-04-08 NOTE — Progress Notes (Signed)
HPI :  68 year old male here for follow-up visit.  He has a history of a gastric GIST, history of gastric ulcer with H. pylori, history of colon polyps.  He has had a history of resection of gastric mass in the past which turned out to be a GIST.  He is in remission from this as far as we know.  He had an EGD with me on July 31 which showed a small gastric ulcer with some slightly nodular surrounding tissue.  Biopsies showed intestinal metaplasia with H. pylori gastritis of both the margin of the ulcer and on random biopsies throughout the stomach otherwise. He was treated with the following regimen for 14 days to treat H. pylori Amoxicillin 1gm BID  Clarithromycin 500mg  BID  Flagyl 500mg  BID  omeprazole BID for this time of antibiotics, then reduce to once daily .  He held his omeprazole for 2 weeks and submit an H. pylori eradication stool test which was negative.  He since resumed omeprazole and states he is feeling really well.  He denies any abdominal pains.  No reflux symptoms.  No nausea or vomiting.  No dysphagia.  Prior to his endoscopy and prior to being on omeprazole he was having reflux symptoms that were bothering him.  He does have longstanding tobacco use.  No family history of gastric cancer.   EGD 12/26/18 - A 2 cm hiatal hernia was present. DH at 47cm from the incisors, GEJ and SCJ located 45cm from the incisors. Findings: - The exam of the esophagus was otherwise normal. No Barrett's - A medium-sized paraesophageal hernia was found on retroflexion. - One non-bleeding superficial gastric ulcer with no stigmata of bleeding was found on the lesser curvature of the gastric body. The lesion was 3 mm in largest dimension. Biopsies were taken with a cold forceps for histology from the periphery of it. - Patchy erythematous mucosa was found in the gastric body and in the gastric antrum. Biopsies were taken with a cold forceps for Helicobacter pylori testing. - The exam of the  stomach was otherwise normal. - The duodenal bulb and second portion of the duodenum were normal.  Biopsies showed H pylori gastritis with GIM  Colonoscopy 12/26/18 - The perianal and digital rectal examinations were normal. - Five sessile polyps were found in the rectum. The polyps were 3 to 4 mm in size. These polyps were removed with a cold snare. Resection and retrieval were complete. - Two sessile polyps were found in the sigmoid colon. The polyps were 3 to 5 mm in size. These polyps were removed with a cold snare. Resection and retrieval were complete. - Two sessile polyps were found in the ascending colon. The polyps were 3 mm in size. These polyps were removed with a cold snare. Resection and retrieval were complete. - Scattered medium-mouthed diverticula were found in the entire colon. - Internal hemorrhoids were found during retroflexion. - The colon was extremely tortuous which prolonged the exam. - The exam was otherwise without abnormality.  Polyps adenomatous / hyperplastic - repear in 3 years       Past Medical History:  Diagnosis Date  . AAA (abdominal aortic aneurysm) ([place])   . GERD (gastroesophageal reflux disease)   . Hyperchloremia   . Hyperlipidemia   . Hypertension   . Irregular heartbeat   . Substance abuse ([place])   . Vitamin D deficiency      Past Surgical History:  Procedure Laterality Date  . ABDOMINAL AORTIC ENDOVASCULAR STENT GRAFT  N/A 08/30/2014   Procedure: ABDOMINAL AORTIC ENDOVASCULAR STENT GRAFT;  Surgeon: Elam Dutch, MD;  Location: [facility] OR;  Service: Vascular;  Laterality: N/A;  . HERNIA REPAIR     bil groins " several time"  . ROBOTIC ADRENALECTOMY     Dr. Lovena Neighbours 01-01-18  . ROBOTIC ADRENALECTOMY Left 03/26/2018   Procedure: XI ROBOTIC PARTIAL  GASTRECTOMY;  Surgeon: Ceasar Mons, MD;  Location: [facility] ORS;  Service: Urology;  Laterality: Left;  . TONSILLECTOMY     History reviewed. No pertinent family history. Social History    Tobacco Use  . Smoking status: Current Every Day Smoker    Packs/day: 0.25    Years: 17.00    Pack years: 4.25    Types: Cigarettes  . Smokeless tobacco: Former Systems developer    Types: Chew    Quit date: 01/20/1974  . Tobacco comment: 3-4 cigarettes per day  Substance Use Topics  . Alcohol use: Not Currently    Alcohol/week: 1.0 standard drinks    Types: 1 Standard drinks or equivalent per week    Comment: 1/2 pint per week  . Drug use: Not Currently    Types: Marijuana, Cocaine    Comment: none in a long time per pt.   Current Outpatient Medications  Medication Sig Dispense Refill  . atorvastatin (LIPITOR) 40 MG tablet Take 1 tablet (40 mg total) by mouth daily. 30 tablet 6  . famotidine (PEPCID) 20 MG tablet Take 1 tablet (20 mg total) by mouth daily. 90 tablet 1  . losartan-hydrochlorothiazide (HYZAAR) 100-25 MG tablet Take 1 tablet by mouth every evening.    Marland Kitchen omeprazole (PRILOSEC) 40 MG capsule Take 1 capsule (40 mg total) by mouth daily. 90 capsule 3  . sildenafil (REVATIO) 20 MG tablet Take 1 tablet by mouth as needed. Dose is 2-5 tablets    . sildenafil (VIAGRA) 100 MG tablet Take 100 mg by mouth as needed.    . tamsulosin (FLOMAX) 0.4 MG CAPS capsule Take 0.4 mg by mouth daily.     No current facility-administered medications for this visit.    Allergies  Allergen Reactions  . Aspirin Nausea Only and Other (See Comments)    HIGH DOSE  Upset patients   stomach-      Review of Systems: All systems reviewed and negative except where noted in HPI.   Lab Results  Component Value Date   WBC 3.9 (L) 03/14/2018   HGB 13.1 03/27/2018   HCT 39.0 03/27/2018   MCV 96.3 03/14/2018   PLT 161 03/14/2018    Lab Results  Component Value Date   CREATININE 1.22 02/06/2019   BUN 16 02/06/2019   NA 142 02/06/2019   K 3.6 02/06/2019   CL 106 02/06/2019   CO2 29 02/06/2019    Lab Results  Component Value Date   ALT 15 02/06/2019   AST 19 02/06/2019   ALKPHOS 74 02/06/2019    BILITOT 0.4 02/06/2019     Physical Exam: BP 112/70   Pulse 78   Temp 97.6 F (36.4 C)   Ht 6\' 3"  (1.905 m)   Wt 179 lb 6.4 oz (81.4 kg)   BMI 22.42 kg/m  Constitutional: Pleasant,well-developed, male in no acute distress. HEENT: Normocephalic and atraumatic. Conjunctivae are normal. No scleral icterus. Neck supple.  Cardiovascular: Normal rate, regular rhythm.  Pulmonary/chest: Effort normal and breath sounds normal. No wheezing, rales or rhonchi. Abdominal: Soft, nondistended, nontender.  There are no masses palpable. No hepatomegaly. Extremities: no edema Lymphadenopathy:  No cervical adenopathy noted. Neurological: Alert and oriented to person place and time. Skin: Skin is warm and dry. No rashes noted. Psychiatric: Normal mood and affect. Behavior is normal.   ASSESSMENT AND PLAN: 68 year old male here for reassessment the following:  Gastric ulcer / history of H. Pylori / gastric intestinal metaplasia - as above, relatively small gastric ulcer however he did have gastric intestinal metaplasia on the periphery of the lesion as well as on random biopsies of the antrum and body elsewhere.  H. pylori has been treated and hopefully eradicated based on stool test.  His reflux is much better controlled on omeprazole at this point.  We discussed whether or not to pursue a surveillance endoscopy to confirm healing of the ulcer and reassess given gastric intestinal metaplasia.  Even his ethnicity and tobacco use he is at slightly higher risk for gastric cancer, although surveillance of this issue in general is not routinely performed in those without risk factors.  We discussed risks and benefits of surveillance endoscopy.  Given his ulcer and the way it appeared to me, and biopsy results, I offered him a follow-up endoscopy to ensure it is healed and no precancerous changes.  He wanted to proceed with endoscopy.  We may take biopsies to get extent of his gastric intestinal metaplasia  during this exam.  Further recommendations pending results, he agreed.  GERD - we will reduce omeprazole to 20 mg a day after discussion of long-term risks and benefits.  If he tolerates this and continues to feel well, may switch over to Pepcid at some point time.  He agreed  History of colon polyps - due for surveillance colonoscopy in 3 years.  He agreed  [place] Cellar, MD [facility]

## 2019-04-08 NOTE — Patient Instructions (Addendum)
If you are age 69 or older, your body mass index should be between 23-30. Your Body mass index is 22.42 kg/m. If this is out of the aforementioned range listed, please consider follow up with your Primary Care Provider.  If you are age 69 or younger, your body mass index should be between 19-25. Your Body mass index is 22.42 kg/m. If this is out of the aformentioned range listed, please consider follow up with your Primary Care Provider.   To help prevent the possible spread of infection to our patients, communities, and staff; we will be implementing the following measures:  As of now we are not allowing any visitors/family members to accompany you to any upcoming appointments with [facility]. If you have any concerns about this please contact our office to discuss prior to the appointment.   You have been scheduled for an endoscopy. Please follow written instructions given to you at your visit today. If you use inhalers (even only as needed), please bring them with you on the day of your procedure. Your physician has requested that you go to www.startemmi.com and enter the access code given to you at your visit today. This web site gives a general overview about your procedure. However, you should still follow specific instructions given to you by our office regarding your preparation for the procedure.   Decrease your omeprazole to 20mg  once a day.  Thank you for entrusting me with your care and for choosing [facility], Dr. [place] Cellar

## 2019-04-09 LAB — SARS CORONAVIRUS 2 (TAT 6-24 HRS): SARS Coronavirus 2: NEGATIVE

## 2019-04-10 ENCOUNTER — Ambulatory Visit (AMBULATORY_SURGERY_CENTER): Payer: Medicare Other | Admitting: Gastroenterology

## 2019-04-10 ENCOUNTER — Encounter: Payer: Self-pay | Admitting: Gastroenterology

## 2019-04-10 ENCOUNTER — Other Ambulatory Visit: Payer: Self-pay

## 2019-04-10 VITALS — BP 132/85 | HR 61 | Temp 97.4°F | Resp 12 | Ht 75.0 in | Wt 179.0 lb

## 2019-04-10 DIAGNOSIS — K2950 Unspecified chronic gastritis without bleeding: Secondary | ICD-10-CM

## 2019-04-10 DIAGNOSIS — K31A0 Gastric intestinal metaplasia, unspecified: Secondary | ICD-10-CM

## 2019-04-10 DIAGNOSIS — K259 Gastric ulcer, unspecified as acute or chronic, without hemorrhage or perforation: Secondary | ICD-10-CM

## 2019-04-10 DIAGNOSIS — K3189 Other diseases of stomach and duodenum: Secondary | ICD-10-CM

## 2019-04-10 MED ORDER — SODIUM CHLORIDE 0.9 % IV SOLN: 500.0000 mL | Freq: Once | INTRAVENOUS | Status: DC

## 2019-04-10 NOTE — Progress Notes (Signed)
Temp by LC VS by CW. 

## 2019-04-10 NOTE — Op Note (Signed)
[facility] Patient Name: Eric Barnett Procedure Date: 04/10/2019 3:39 PM MRN: [id number] Endoscopist: Remo Lipps P. Havery Moros , MD Age: 68 Referring MD:  Date of Birth: 1950/05/30 Gender: Male Account #: [id number] Procedure:                Upper GI endoscopy Indications:              Follow-up of gastric ulcer, history of H pylori                            gastritis with intestinal metaplasia on biopsies of                            the random stomach and periphery of the ulcer, here                            for surveillance EGD to confirm healing of the                            ulcer and will perform mapping for gastric                            intestinal metaplasia Medicines:                Monitored Anesthesia Care Procedure:                Pre-Anesthesia Assessment:                           - Prior to the procedure, a History and Physical                            was performed, and patient medications and                            allergies were reviewed. The patient's tolerance of                            previous anesthesia was also reviewed. The risks                            and benefits of the procedure and the sedation                            options and risks were discussed with the patient.                            All questions were answered, and informed consent                            was obtained. Prior Anticoagulants: The patient has                            taken no previous anticoagulant or antiplatelet  agents. ASA Grade Assessment: III - A patient with                            severe systemic disease. After reviewing the risks                            and benefits, the patient was deemed in                            satisfactory condition to undergo the procedure.                           After obtaining informed consent, the endoscope was                            passed under direct vision.  Throughout the                            procedure, the patient's blood pressure, pulse, and                            oxygen saturations were monitored continuously. The                            Endoscope was introduced through the mouth, and                            advanced to the second part of duodenum. The upper                            GI endoscopy was accomplished without difficulty.                            The patient tolerated the procedure well. Scope In: Scope Out: Findings:                 Esophagogastric landmarks were identified: the                            Z-line was found at 45 cm, the gastroesophageal                            junction was found at 45 cm and the upper extent of                            the gastric folds was found at 47 cm from the                            incisors.                           A 2 cm hiatal hernia was present.  The exam of the esophagus was otherwise normal.                           A medium-sized paraesophageal hernia was found on                            retroflexed views                           Localized mildly erythematous mucosa was found in                            the gastric body at the site of the prior ulcer,                            but interval healing of the ulcer has occured.                           The exam of the stomach was otherwise normal.                           Biopsies were taken with a cold forceps in the                            gastric fundus, in the gastric body, at the                            incisura and in the gastric antrum for histology.                           The duodenal bulb and second portion of the                            duodenum were normal. Complications:            No immediate complications. Estimated blood loss:                            Minimal. Estimated Blood Loss:     Estimated blood loss was minimal. Impression:                - Esophagogastric landmarks identified.                           - 2 cm hiatal hernia.                           - Medium-sized paraesophageal hernia.                           - Normal esophagus otherwise.                           - Erythematous mucosa in the gastric body, interval  healing of the ulcer.                           - Biopsies taken of the stomach to assess for                            gastric intestinal metaplasia as above, and test                            for H pylori eradication.                           - Normal duodenal bulb and second portion of the                            duodenum. Recommendation:           - Patient has a contact number available for                            emergencies. The signs and symptoms of potential                            delayed complications were discussed with the                            patient. Return to normal activities tomorrow.                            Written discharge instructions were provided to the                            patient.                           - Resume previous diet.                           - Continue present medications.                           - Await pathology results with further                            recommendations. Remo Lipps P. [name], MD 04/10/2019 4:05:07 PM This report has been signed electronically.

## 2019-04-10 NOTE — Progress Notes (Signed)
PT taken to PACU. Monitors in place. VSS. Report given to RN. 

## 2019-04-10 NOTE — Patient Instructions (Signed)
See handout on hiatal hernia    YOU HAD AN ENDOSCOPIC PROCEDURE TODAY AT [place]:   Refer to the procedure report that was given to you for any specific questions about what was found during the examination.  If the procedure report does not answer your questions, please call your gastroenterologist to clarify.  If you requested that your care partner not be given the details of your procedure findings, then the procedure report has been included in a sealed envelope for you to review at your convenience later.  YOU SHOULD EXPECT: Some feelings of bloating in the abdomen. Passage of more gas than usual.  Walking can help get rid of the air that was put into your GI tract during the procedure and reduce the bloating. If you had a lower endoscopy (such as a colonoscopy or flexible sigmoidoscopy) you may notice spotting of blood in your stool or on the toilet paper. If you underwent a bowel prep for your procedure, you may not have a normal bowel movement for a few days.  Please Note:  You might notice some irritation and congestion in your nose or some drainage.  This is from the oxygen used during your procedure.  There is no need for concern and it should clear up in a day or so.  SYMPTOMS TO REPORT IMMEDIATELY:    Following upper endoscopy (EGD)  Vomiting of blood or coffee ground material  New chest pain or pain under the shoulder blades  Painful or persistently difficult swallowing  New shortness of breath  Fever of 100F or higher  Black, tarry-looking stools  For urgent or emergent issues, a gastroenterologist can be reached at any hour by calling [phone].   DIET:  We do recommend a small meal at first, but then you may proceed to your regular diet.  Drink plenty of fluids but you should avoid alcoholic beverages for 24 hours.  ACTIVITY:  You should plan to take it easy for the rest of today and you should NOT DRIVE or use heavy machinery until tomorrow  (because of the sedation medicines used during the test).    FOLLOW UP: Our staff will call the number listed on your records 48-72 hours following your procedure to check on you and address any questions or concerns that you may have regarding the information given to you following your procedure. If we do not reach you, we will leave a message.  We will attempt to reach you two times.  During this call, we will ask if you have developed any symptoms of COVID 19. If you develop any symptoms (ie: fever, flu-like symptoms, shortness of breath, cough etc.) before then, please call [phone].  If you test positive for Covid 19 in the 2 weeks post procedure, please call and report this information to [place].    If any biopsies were taken you will be contacted by phone or by letter within the next 1-3 weeks.  Please call us at [phone] if you have not heard about the biopsies in 3 weeks.    SIGNATURES/CONFIDENTIALITY: You and/or your care partner have signed paperwork which will be entered into your electronic medical record.  These signatures attest to the fact that that the information above on your After Visit Summary has been reviewed and is understood.  Full responsibility of the confidentiality of this discharge information lies with you and/or your care-partner.

## 2019-04-10 NOTE — Progress Notes (Signed)
Called to room to assist during endoscopic procedure.  Patient ID and intended procedure confirmed with present staff. Received instructions for my participation in the procedure from the performing physician.  

## 2019-04-14 ENCOUNTER — Telehealth: Payer: Self-pay

## 2019-04-14 ENCOUNTER — Telehealth: Payer: Self-pay | Admitting: *Deleted

## 2019-04-14 NOTE — Telephone Encounter (Signed)
Left message on follow up call. 

## 2019-04-14 NOTE — Telephone Encounter (Signed)
1. Have you developed a fever since your procedure? no  2.   Have you had an respiratory symptoms (SOB or cough) since your procedure? no  3.   Have you tested positive for COVID 19 since your procedure no  4.   Have you had any family members/close contacts diagnosed with the COVID 19 since your procedure?  no   If yes to any of these questions please route to Joylene John, RN and Alphonsa Gin, Therapist, sports.    Follow up Call-  Call back number 04/10/2019 12/26/2018  Post procedure Call Back phone  # [phone]  Permission to leave phone message Yes Yes  Some recent data might be hidden     Patient questions:  Do you have a fever, pain , or abdominal swelling? No. Pain Score  0 *  Have you tolerated food without any problems? Yes.    Have you been able to return to your normal activities? Yes.    Do you have any questions about your discharge instructions: Diet   No. Medications  No. Follow up visit  No.  Do you have questions or concerns about your Care? No.  Actions: * If pain score is 4 or above: No action needed, pain <4.

## 2019-04-16 ENCOUNTER — Encounter: Payer: Self-pay | Admitting: Gastroenterology

## 2019-08-10 ENCOUNTER — Inpatient Hospital Stay: Payer: Medicare HMO | Attending: Nurse Practitioner | Admitting: Nurse Practitioner

## 2019-08-10 ENCOUNTER — Other Ambulatory Visit: Payer: Self-pay

## 2019-08-10 ENCOUNTER — Encounter: Payer: Self-pay | Admitting: Nurse Practitioner

## 2019-08-10 VITALS — BP 109/79 | HR 88 | Temp 98.5°F | Resp 18 | Ht 75.0 in | Wt 186.0 lb

## 2019-08-10 DIAGNOSIS — F17210 Nicotine dependence, cigarettes, uncomplicated: Secondary | ICD-10-CM | POA: Insufficient documentation

## 2019-08-10 DIAGNOSIS — E7800 Pure hypercholesterolemia, unspecified: Secondary | ICD-10-CM | POA: Diagnosis not present

## 2019-08-10 DIAGNOSIS — C49A2 Gastrointestinal stromal tumor of stomach: Secondary | ICD-10-CM | POA: Diagnosis not present

## 2019-08-10 DIAGNOSIS — I10 Essential (primary) hypertension: Secondary | ICD-10-CM | POA: Insufficient documentation

## 2019-08-10 DIAGNOSIS — Z903 Acquired absence of stomach [part of]: Secondary | ICD-10-CM | POA: Insufficient documentation

## 2019-08-10 NOTE — Progress Notes (Addendum)
[facility] OFFICE PROGRESS NOTE   Diagnosis: Gastrointestinal stromal tumor  INTERVAL HISTORY:   Eric Barnett returns as scheduled.  He feels well.  He denies abdominal pain.  No change in bowel habits.  No blood with bowel movements.  He has a good appetite.  No fever, cough, shortness of breath.  He smokes 2 to 3 cigarettes/day.  He is trying to quit.  Objective:  Vital signs in last 24 hours:  Blood pressure 109/79, pulse 88, temperature 98.5 F (36.9 C), temperature source Temporal, resp. rate 18, height '6\' 3"'  (1.905 m), weight 186 lb (84.4 kg), SpO2 100 %.    HEENT: Neck without mass. Lymphatics: No palpable cervical, supraclavicular, axillary or inguinal lymph nodes. Resp: Lungs with scattered rhonchi.  No respiratory distress. Cardio: Regular rate and rhythm. GI: Abdomen soft and nontender.  No hepatomegaly.  No mass. Vascular: No leg edema.   Lab Results:  Lab Results  Component Value Date   WBC 3.9 (L) 03/14/2018   HGB 13.1 03/27/2018   HCT 39.0 03/27/2018   MCV 96.3 03/14/2018   PLT 161 03/14/2018   NEUTROABS 2.0 12/15/2009    Imaging:  No results found.  Medications: I have reviewed the patient's current medications.  Assessment/Plan: 1. Gastric GIST  CT angio abdomen/pelvis 08/05/2017-left adrenal gland mass measuring 7.3 x 5.3 x 4.6 cm; approximate 2.4 x 1.9 cm right adrenal gland nodule; diffuse thickening of the rectal wall.    MRI of the abdomen 08/10/2014-2.0 x 1.8 cm right adrenal gland nodule, 5.0 x 6.6 x 5.5 cm lobulated mass in the left upper abdomen possibly arising exophytically from the left adrenal gland, 5.2 x 5.5 cm infrarenal abdominal aortic aneurysm.   CT renal stone study 08/26/2017-stable 2 cm right adrenal adenoma;  5.7 x 7.3 cm mass in the left upper quadrant which appeared to be arising from the left adrenal gland.    CT abdomen/pelvis on 10/12/2017-2 mm left lower lobe nodule, fluid density right adrenal nodule  measuring 2.0 cm, heterogeneous mass in the left adrenal gland measuring 5.5 x 7.5 cm.    He was taken to the OR on 03/26/2018 by Dr. Lovena Neighbours.  Intraoperatively the mass was found to be densely adherent to the pancreas as well as the posterior aspects of the spleen.  The mass also found to be densely adherent to the inferior portion of the posterior fundus of the stomach.  Dr.Gross performed a partial gastrectomy.  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  CT abdomen/pelvis 02/06/2019- no evidence of recurrent disease 2. Hypertension 3. Hypercholesterolemia 4. History of abdominal aortic aneurysm status post repair 08/30/2014 5. Health maintenance.  Influenza vaccine today.  Referred for screening colonoscopy. 6. Upper endoscopy 12/26/2018-nonbleeding gastric ulcer, biopsy revealed H. pylori, treated for H. Pylori 7. Multiple polyps on colonoscopy 12/26/2018-hyperplastic polyps and tubular adenomas  Disposition: Eric Barnett remains in clinical remission from the gastrointestinal stromal tumor.  He will undergo CT scans in approximately 6 months.  He continues to smoke.  We discussed smoking cessation.  He declines information regarding the smoking cessation program.  He thinks he will be able to quit on his own.  I encouraged him to contact the office if he changes his mind.  We will see if he qualifies for the lung  cancer screening program.  He will return for a basic metabolic panel and CT scans in approximately 6 months.  We will see him 2 to 3 days later to review the results.  He will contact the office in the interim with any problems.   Ned Card ANP/GNP-BC   08/10/2019  1:55 PM

## 2019-08-12 ENCOUNTER — Telehealth: Payer: Self-pay | Admitting: Oncology

## 2019-08-12 NOTE — Telephone Encounter (Signed)
Scheduled per los. Called and left msg. Mailed printout  °

## 2019-10-07 ENCOUNTER — Other Ambulatory Visit: Payer: Self-pay | Admitting: Gastroenterology

## 2019-10-07 ENCOUNTER — Other Ambulatory Visit: Payer: Self-pay

## 2019-10-07 MED ORDER — OMEPRAZOLE 20 MG PO CPDR: 20.0000 mg | DELAYED_RELEASE_CAPSULE | Freq: Every day | ORAL | 1 refills | Status: DC

## 2020-02-10 ENCOUNTER — Other Ambulatory Visit: Payer: Self-pay | Admitting: Nurse Practitioner

## 2020-02-10 ENCOUNTER — Inpatient Hospital Stay: Payer: Medicare HMO | Attending: Oncology

## 2020-02-10 ENCOUNTER — Ambulatory Visit (HOSPITAL_COMMUNITY)
Admission: RE | Admit: 2020-02-10 | Discharge: 2020-02-10 | Disposition: A | Discharge status: Home / Self Care | Payer: Medicare HMO | Source: Ambulatory Visit | Attending: Nurse Practitioner | Admitting: Nurse Practitioner

## 2020-02-10 ENCOUNTER — Encounter (HOSPITAL_COMMUNITY): Payer: Self-pay

## 2020-02-10 ENCOUNTER — Other Ambulatory Visit: Payer: Self-pay

## 2020-02-10 DIAGNOSIS — Z903 Acquired absence of stomach [part of]: Secondary | ICD-10-CM | POA: Insufficient documentation

## 2020-02-10 DIAGNOSIS — E7800 Pure hypercholesterolemia, unspecified: Secondary | ICD-10-CM | POA: Insufficient documentation

## 2020-02-10 DIAGNOSIS — C49A2 Gastrointestinal stromal tumor of stomach: Secondary | ICD-10-CM | POA: Insufficient documentation

## 2020-02-10 DIAGNOSIS — N289 Disorder of kidney and ureter, unspecified: Secondary | ICD-10-CM | POA: Insufficient documentation

## 2020-02-10 DIAGNOSIS — I10 Essential (primary) hypertension: Secondary | ICD-10-CM | POA: Insufficient documentation

## 2020-02-10 DIAGNOSIS — I714 Abdominal aortic aneurysm, without rupture: Secondary | ICD-10-CM | POA: Insufficient documentation

## 2020-02-10 DIAGNOSIS — N390 Urinary tract infection, site not specified: Secondary | ICD-10-CM | POA: Insufficient documentation

## 2020-02-10 LAB — BASIC METABOLIC PANEL - CANCER CENTER ONLY
Anion gap: 8 (ref 5–15)
BUN: 20 mg/dL (ref 8–23)
CO2: 29 mmol/L (ref 22–32)
Calcium: 10.3 mg/dL (ref 8.9–10.3)
Chloride: 104 mmol/L (ref 98–111)
Creatinine: 1.55 mg/dL — ABNORMAL HIGH (ref 0.61–1.24)
GFR, Est AFR Am: 52 mL/min — ABNORMAL LOW (ref >60)
GFR, Estimated: 45 mL/min — ABNORMAL LOW (ref >60)
Glucose, Bld: 99 mg/dL (ref 70–99)
Potassium: 3.3 mmol/L — ABNORMAL LOW (ref 3.5–5.1)
Sodium: 141 mmol/L (ref 135–145)

## 2020-02-10 MED ORDER — IOHEXOL 300 MG/ML  SOLN: 100.0000 mL | Freq: Once | INTRAMUSCULAR | Status: DC | PRN

## 2020-02-11 ENCOUNTER — Ambulatory Visit (HOSPITAL_COMMUNITY)
Admission: RE | Admit: 2020-02-11 | Discharge: 2020-02-11 | Disposition: A | Discharge status: Home / Self Care | Payer: Medicare HMO | Source: Ambulatory Visit | Attending: Nurse Practitioner | Admitting: Nurse Practitioner

## 2020-02-11 ENCOUNTER — Other Ambulatory Visit: Payer: Self-pay | Admitting: Nurse Practitioner

## 2020-02-11 ENCOUNTER — Telehealth: Payer: Self-pay | Admitting: Nurse Practitioner

## 2020-02-11 DIAGNOSIS — C49A2 Gastrointestinal stromal tumor of stomach: Secondary | ICD-10-CM

## 2020-02-11 MED ORDER — IOHEXOL 300 MG/ML  SOLN
100.0000 mL | Freq: Once | INTRAMUSCULAR | Status: AC | PRN
  Administered 2020-02-11: 100 mL via INTRAVENOUS

## 2020-02-11 NOTE — Telephone Encounter (Signed)
I contacted Eric Barnett regarding the CT scan he had earlier today.  There is a question of pyelonephritis.  He denies fever, back pain, dysuria.  He urinates frequently which is not new for him.  We also discussed the lab results from yesterday which showed creatinine was elevated at 1.55.  He will push fluids this evening/tonight.  He is scheduled to come to the [facility] tomorrow for an office visit.  We will check a urinalysis/urine culture and repeat a basic metabolic panel at that time.  He expressed understanding of the above.

## 2020-02-12 ENCOUNTER — Inpatient Hospital Stay: Payer: Medicare HMO

## 2020-02-12 ENCOUNTER — Telehealth: Payer: Self-pay | Admitting: *Deleted

## 2020-02-12 ENCOUNTER — Other Ambulatory Visit: Payer: Self-pay

## 2020-02-12 ENCOUNTER — Inpatient Hospital Stay (HOSPITAL_BASED_OUTPATIENT_CLINIC_OR_DEPARTMENT_OTHER): Payer: Medicare HMO | Admitting: Oncology

## 2020-02-12 VITALS — BP 122/78 | HR 77 | Temp 96.8°F | Resp 16 | Ht 75.0 in | Wt 179.3 lb

## 2020-02-12 DIAGNOSIS — C49A2 Gastrointestinal stromal tumor of stomach: Secondary | ICD-10-CM

## 2020-02-12 LAB — URINALYSIS, COMPLETE (UACMP) WITH MICROSCOPIC
Bilirubin Urine: NEGATIVE
Glucose, UA: NEGATIVE mg/dL
Ketones, ur: NEGATIVE mg/dL
Nitrite: POSITIVE — AB
Protein, ur: NEGATIVE mg/dL
Specific Gravity, Urine: 1.019 (ref 1.005–1.030)
WBC, UA: >50 WBC/hpf — ABNORMAL HIGH (ref 0–5)
pH: 5 (ref 5.0–8.0)

## 2020-02-12 LAB — BASIC METABOLIC PANEL - CANCER CENTER ONLY
Anion gap: 8 (ref 5–15)
BUN: 20 mg/dL (ref 8–23)
CO2: 29 mmol/L (ref 22–32)
Calcium: 9.8 mg/dL (ref 8.9–10.3)
Chloride: 106 mmol/L (ref 98–111)
Creatinine: 1.56 mg/dL — ABNORMAL HIGH (ref 0.61–1.24)
GFR, Est AFR Am: 52 mL/min — ABNORMAL LOW (ref >60)
GFR, Estimated: 45 mL/min — ABNORMAL LOW (ref >60)
Glucose, Bld: 100 mg/dL — ABNORMAL HIGH (ref 70–99)
Potassium: 3.2 mmol/L — ABNORMAL LOW (ref 3.5–5.1)
Sodium: 143 mmol/L (ref 135–145)

## 2020-02-12 MED ORDER — CIPROFLOXACIN HCL 500 MG PO TABS: 500.0000 mg | ORAL_TABLET | Freq: Two times a day (BID) | ORAL | 0 refills | Status: DC

## 2020-02-12 NOTE — Progress Notes (Signed)
[facility] OFFICE PROGRESS NOTE   Diagnosis: Gastrointestinal stromal tumor  INTERVAL HISTORY:   [name] returns as scheduled.  He reports feeling well.  Good appetite.  No abdominal pain or nausea.  He has urinary frequency and intermittent incontinence.  He reports undergoing a "procedure "by Dr. Gloriann Loan last year.  Objective:  Vital signs in last 24 hours:  Blood pressure (!) 140/95, pulse 77, temperature (!) 96.8 F (36 C), temperature source Tympanic, resp. rate 16, height '6\' 3"'  (1.905 m), weight 179 lb 4.8 oz (81.3 kg), SpO2 98 %.     Lymphatics: No cervical, supraclavicular, axillary, or inguinal nodes Resp: Lungs clear bilaterally Cardio: Regular rate and rhythm GI: No hepatosplenomegaly, no mass, nontender Vascular: No leg edema   Lab Results:  Lab Results  Component Value Date   WBC 3.9 (L) 03/14/2018   HGB 13.1 03/27/2018   HCT 39.0 03/27/2018   MCV 96.3 03/14/2018   PLT 161 03/14/2018   NEUTROABS 2.0 12/15/2009    CMP  Lab Results  Component Value Date   NA 143 02/12/2020   K 3.2 (L) 02/12/2020   CL 106 02/12/2020   CO2 29 02/12/2020   GLUCOSE 100 (H) 02/12/2020   BUN 20 02/12/2020   CREATININE 1.56 (H) 02/12/2020   CALCIUM 9.8 02/12/2020   PROT 7.0 02/06/2019   ALBUMIN 4.0 02/06/2019   AST 19 02/06/2019   ALT 15 02/06/2019   ALKPHOS 74 02/06/2019   BILITOT 0.4 02/06/2019   GFRNONAA 45 (L) 02/12/2020   GFRAA 52 (L) 02/12/2020    No results found for: CEA1  Lab Results  Component Value Date   INR 1.07 08/19/2014    Imaging:  CT Chest W Contrast  Result Date: 02/11/2020 CLINICAL DATA:  Gastrointestinal stromal tumor follow-up evaluation. EXAM: CT CHEST, ABDOMEN, AND PELVIS WITH CONTRAST TECHNIQUE: Multidetector CT imaging of the chest, abdomen and pelvis was performed following the standard protocol during bolus administration of intravenous contrast. CONTRAST:  158m OMNIPAQUE IOHEXOL 300 MG/ML  SOLN COMPARISON:   02/06/2019 FINDINGS: CT CHEST FINDINGS Cardiovascular: Calcified noncalcified atheromatous plaque of the thoracic aorta. 4.4 cm dilation of the sinuses at the aortic root. Sinotubular junction 3.1 cm. Prior imaging suggests this is a chronic process, previous CT included the aortic root. Heart size is normal without pericardial effusion. Central pulmonary vasculature unremarkable on venous phase. Mediastinum/Nodes: Thoracic inlet structures without signs of adenopathy. No axillary lymphadenopathy. No mediastinal lymphadenopathy. No hilar lymphadenopathy. Esophagus mildly patulous. Lungs/Pleura: Pulmonary pleural and parenchymal scarring mixed with atelectasis. Airways are patent. No consolidation. No pleural effusion. Low lung volumes. Musculoskeletal: No acute musculoskeletal process about the bony thorax, see below for full musculoskeletal detail. CT ABDOMEN PELVIS FINDINGS Hepatobiliary: No focal, suspicious hepatic lesion. Portal vein is patent. No pericholecystic stranding. No biliary duct dilation. Mildly limited assessment of upper abdominal structures due to respiratory motion. Pancreas: No pancreatic contour abnormality or peripancreatic stranding. Spleen: Spleen normal in size and contour.  No focal lesion. Adrenals/Urinary Tract: Well-circumscribed RIGHT adrenal lesion with homogeneous density stable since prior study approximately 1.7 cm. LEFT adrenal is normal. Moderate-sized bladder diverticulum arises from posterolateral RIGHT urinary bladder. Urinary bladder mildly distended limiting assessment. Stable mildly patulous appearance of the RIGHT renal pelvis no hydronephrosis. Mild urothelial enhancement is diffuse along the proximal RIGHT ureter. There is mild stray shin of the nephrogram bilaterally more so on the RIGHT than the LEFT. Stomach/Bowel: No acute gastrointestinal process. Appendix is normal.z colonic diverticulosis. Vascular/Lymphatic: Abdominal aortic aneurysm,  infrarenal segment post  aorto bi-iliac endograft placement. Caliber proximally 3.9 cm in maximal dimension unchanged over 1 year interval. Scattered small lymph nodes about the retroperitoneum none with pathologic enlargement or change from the previous exam No upper abdominal lymphadenopathy No pelvic lymphadenopathy Reproductive: Prostate heterogeneous, nonspecific finding on CT. Other: No ascites. Musculoskeletal: No acute musculoskeletal process. Spinal degenerative changes. IMPRESSION: 1. Postoperative changes about the stomach, no sign of disease recurrence in the chest, abdomen or pelvis. 2. Mild urothelial enhancement along the proximal RIGHT ureter with mildly striated nephrogram bilaterally more so on the RIGHT than the LEFT. Correlate with any clinical or laboratory evidence of urinary tract infection/pyelonephritis. 3. Stable abdominal aortic aneurysm post aorto bi-iliac endograft placement. 4. Pulmonary pleural and parenchymal scarring mixed with atelectasis. 5. 4.4 cm dilation of the sinuses at the aortic root. Dedicated chest imaging on follow-up may be helpful to assess for any change. Correlation with echocardiography may be helpful to assess for any signs of valvular dysfunction. These results will be called to the ordering clinician or representative by the Radiologist Assistant, and communication documented in the PACS or [employer]. Aortic Atherosclerosis (ICD10-I70.0). Electronically Signed   By: Zetta Bills M.D.   On: 02/11/2020 16:49   CT Abdomen Pelvis W Contrast  Result Date: 02/11/2020 CLINICAL DATA:  Gastrointestinal stromal tumor follow-up evaluation. EXAM: CT CHEST, ABDOMEN, AND PELVIS WITH CONTRAST TECHNIQUE: Multidetector CT imaging of the chest, abdomen and pelvis was performed following the standard protocol during bolus administration of intravenous contrast. CONTRAST:  1110m OMNIPAQUE IOHEXOL 300 MG/ML  SOLN COMPARISON:  02/06/2019 FINDINGS: CT CHEST FINDINGS Cardiovascular: Calcified  noncalcified atheromatous plaque of the thoracic aorta. 4.4 cm dilation of the sinuses at the aortic root. Sinotubular junction 3.1 cm. Prior imaging suggests this is a chronic process, previous CT included the aortic root. Heart size is normal without pericardial effusion. Central pulmonary vasculature unremarkable on venous phase. Mediastinum/Nodes: Thoracic inlet structures without signs of adenopathy. No axillary lymphadenopathy. No mediastinal lymphadenopathy. No hilar lymphadenopathy. Esophagus mildly patulous. Lungs/Pleura: Pulmonary pleural and parenchymal scarring mixed with atelectasis. Airways are patent. No consolidation. No pleural effusion. Low lung volumes. Musculoskeletal: No acute musculoskeletal process about the bony thorax, see below for full musculoskeletal detail. CT ABDOMEN PELVIS FINDINGS Hepatobiliary: No focal, suspicious hepatic lesion. Portal vein is patent. No pericholecystic stranding. No biliary duct dilation. Mildly limited assessment of upper abdominal structures due to respiratory motion. Pancreas: No pancreatic contour abnormality or peripancreatic stranding. Spleen: Spleen normal in size and contour.  No focal lesion. Adrenals/Urinary Tract: Well-circumscribed RIGHT adrenal lesion with homogeneous density stable since prior study approximately 1.7 cm. LEFT adrenal is normal. Moderate-sized bladder diverticulum arises from posterolateral RIGHT urinary bladder. Urinary bladder mildly distended limiting assessment. Stable mildly patulous appearance of the RIGHT renal pelvis no hydronephrosis. Mild urothelial enhancement is diffuse along the proximal RIGHT ureter. There is mild stray shin of the nephrogram bilaterally more so on the RIGHT than the LEFT. Stomach/Bowel: No acute gastrointestinal process. Appendix is normal.z colonic diverticulosis. Vascular/Lymphatic: Abdominal aortic aneurysm, infrarenal segment post aorto bi-iliac endograft placement. Caliber proximally 3.9 cm in  maximal dimension unchanged over 1 year interval. Scattered small lymph nodes about the retroperitoneum none with pathologic enlargement or change from the previous exam No upper abdominal lymphadenopathy No pelvic lymphadenopathy Reproductive: Prostate heterogeneous, nonspecific finding on CT. Other: No ascites. Musculoskeletal: No acute musculoskeletal process. Spinal degenerative changes. IMPRESSION: 1. Postoperative changes about the stomach, no sign of disease recurrence in the chest, abdomen or  pelvis. 2. Mild urothelial enhancement along the proximal RIGHT ureter with mildly striated nephrogram bilaterally more so on the RIGHT than the LEFT. Correlate with any clinical or laboratory evidence of urinary tract infection/pyelonephritis. 3. Stable abdominal aortic aneurysm post aorto bi-iliac endograft placement. 4. Pulmonary pleural and parenchymal scarring mixed with atelectasis. 5. 4.4 cm dilation of the sinuses at the aortic root. Dedicated chest imaging on follow-up may be helpful to assess for any change. Correlation with echocardiography may be helpful to assess for any signs of valvular dysfunction. These results will be called to the ordering clinician or representative by the Radiologist Assistant, and communication documented in the PACS or [employer]. Aortic Atherosclerosis (ICD10-I70.0). Electronically Signed   By: Zetta Bills M.D.   On: 02/11/2020 16:49    Medications: I have reviewed the patient's current medications.   Assessment/Plan:  1. Gastric GIST  CT angio abdomen/pelvis 08/05/2017-left adrenal gland mass measuring 7.3 x 5.3 x 4.6 cm; approximate 2.4 x 1.9 cm right adrenal gland nodule; diffuse thickening of the rectal wall.    MRI of the abdomen 08/10/2014-2.0 x 1.8 cm right adrenal gland nodule, 5.0 x 6.6 x 5.5 cm lobulated mass in the left upper abdomen possibly arising exophytically from the left adrenal gland, 5.2 x 5.5 cm infrarenal abdominal aortic aneurysm.   CT  renal stone study 08/26/2017-stable 2 cm right adrenal adenoma;  5.7 x 7.3 cm mass in the left upper quadrant which appeared to be arising from the left adrenal gland.    CT abdomen/pelvis on 10/12/2017-2 mm left lower lobe nodule, fluid density right adrenal nodule measuring 2.0 cm, heterogeneous mass in the left adrenal gland measuring 5.5 x 7.5 cm.    He was taken to the OR on 03/26/2018 by Dr. Lovena Neighbours.  Intraoperatively the mass was found to be densely adherent to the pancreas as well as the posterior aspects of the spleen.  The mass also found to be densely adherent to the inferior portion of the posterior fundus of the stomach.  Dr.Gross performed a partial gastrectomy.  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  CT abdomen/pelvis 02/06/2019- no evidence of recurrent disease  CTs 02/11/2020-no evidence of recurrent disease, mild urothelial enhancement 2. Hypertension 3. Hypercholesterolemia 4. History of abdominal aortic aneurysm status post repair 08/30/2014 5. Health maintenance.  Influenza vaccine today.  Referred for screening colonoscopy. 6. Upper endoscopy 12/26/2018-nonbleeding gastric ulcer, biopsy revealed H. pylori, treated for H. Pylori 7. Multiple polyps on colonoscopy 12/26/2018-hyperplastic polyps and tubular adenomas 8. Renal insufficiency 9. Urinary tract infection 02/12/2020   Disposition: [name] is in clinical remission from the gastrointestinal stromal tumor.  He will return for an office visit in 6 months.  We will plan for a surveillance CT in 1 year.  He appears to have a urinary tract infection today.  We prescribe ciprofloxacin and will follow up on a urine culture.  He has a bladder diverticulum and symptoms of prostatic  hypertrophy.  He has seen Dr. Gloriann Loan.  I recommended he follow-up with Dr. Gloriann Loan to evaluate the urinary symptoms and CT findings of the urinary tract.  The creatinine was elevated with a low potassium yesterday and again today.  He appears to have chronic renal insufficiency, potentially related to urinary obstruction.  We will forward the chemistry  panel to his primary provider for management of the hypokalemia and elevated creatinine.  He did receive CT contrast yesterday.  Mr. Obara declines the Covid vaccine and influenza vaccine.  He requested a DNR form.  I discussed CODE STATUS with him and he indicated he wishes to be placed on a no CODE BLUE status.  Betsy Coder, MD  02/12/2020  11:50 AM

## 2020-02-12 NOTE — Telephone Encounter (Signed)
Notified nurse of his PCP office, Izora Gala that office note and BMP being faxed to [phone] for review. PCP needs to f/u on his high creatinine and low K+.

## 2020-02-15 ENCOUNTER — Telehealth: Payer: Self-pay | Admitting: Oncology

## 2020-02-15 LAB — URINE CULTURE: Culture: >=100000 — AB

## 2020-02-15 NOTE — Telephone Encounter (Signed)
Scheduled appointment per 9/17 los. Called patient, no answer. Left a message on patient's voicemail with appointment date and time.

## 2020-02-25 ENCOUNTER — Other Ambulatory Visit: Payer: Self-pay | Admitting: Gastroenterology

## 2020-03-17 IMAGING — CT CT RENAL STONE PROTOCOL
2 of 4 series · 16 of 46 positions shown, 18 images · non-contrast
Comparison: CT of the abdomen pelvis dated 09/29/2014

CLINICAL DATA: 66-year-old male with flank pain. Evaluate for renal
stone.

EXAM:
CT ABDOMEN AND PELVIS WITHOUT CONTRAST
TECHNIQUE: Multidetector CT imaging of the abdomen and pelvis was performed
following the standard protocol without IV contrast.

[Series 3: ap without f_0.6 · axial · non-contrast · 0.75mm/px · z∈[+952,+1372]mm · 13 of 94 slices shown, 15 images (1 of 2)]
[im 5/94  soft-tissue]
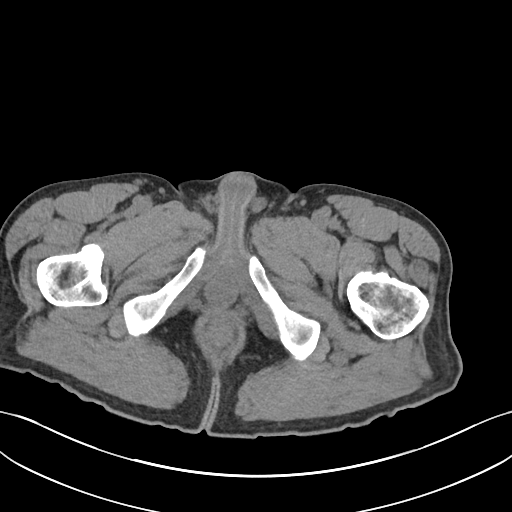
[im 5/94  bone]
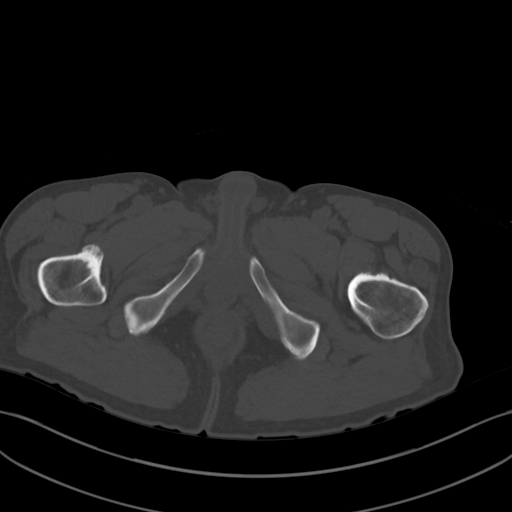
[im 14/94  soft-tissue]
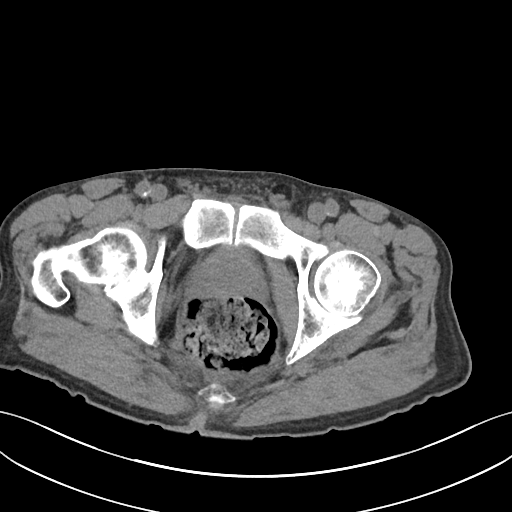
[im 19/94  soft-tissue]
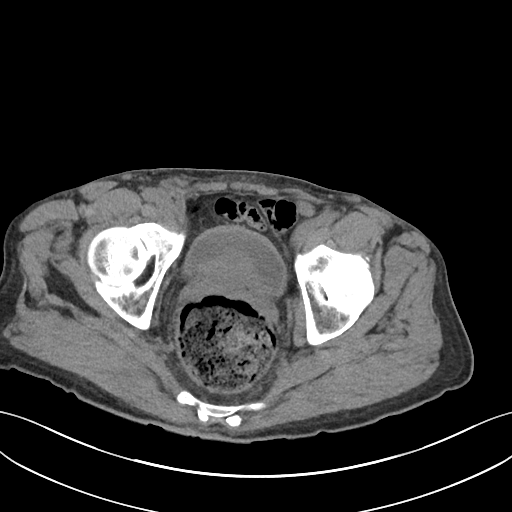
[im 28/94  soft-tissue]
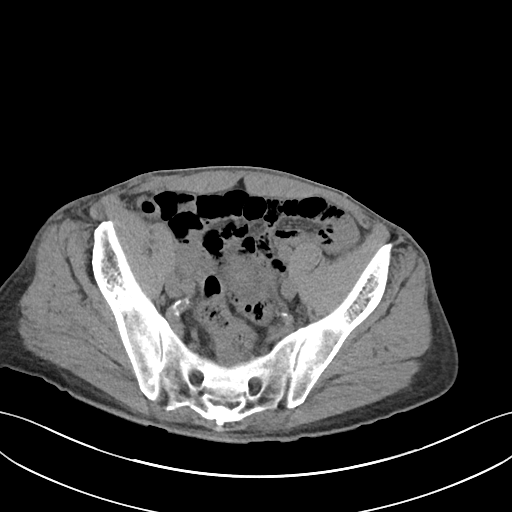
[im 33/94  soft-tissue]
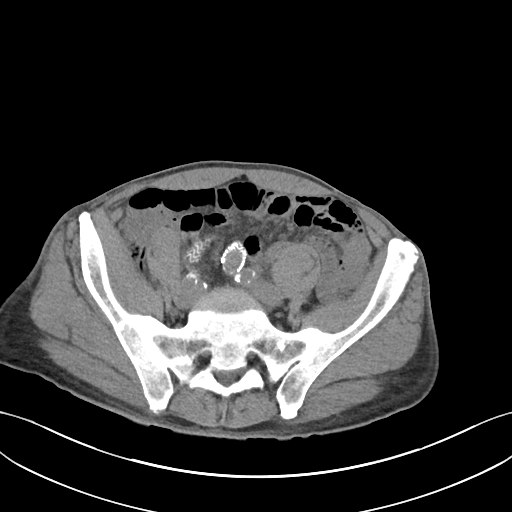
[im 42/94  soft-tissue]
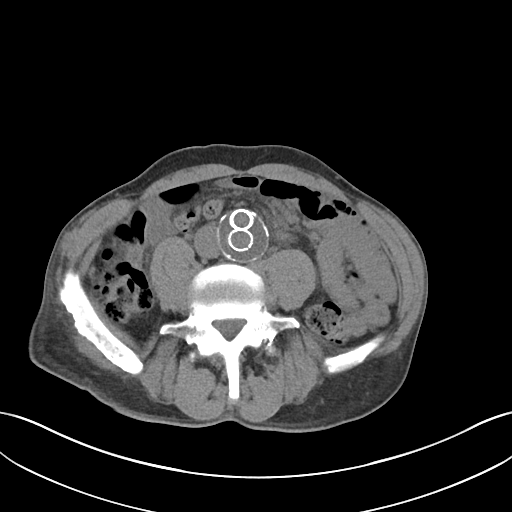
[im 47/94  soft-tissue]
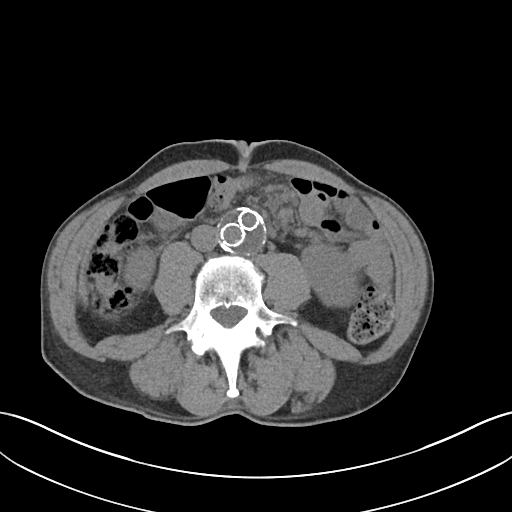
[im 52/94  soft-tissue]
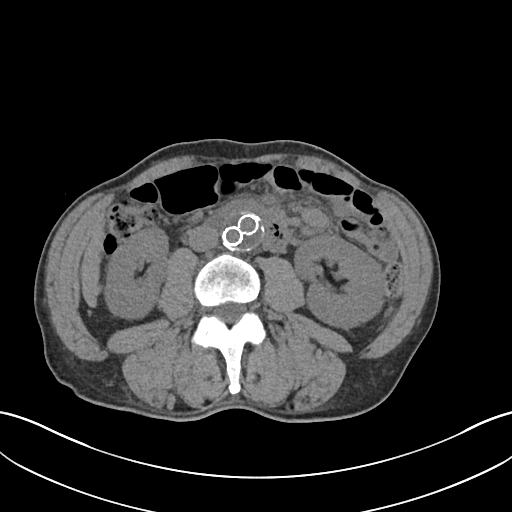
[im 61/94  soft-tissue]
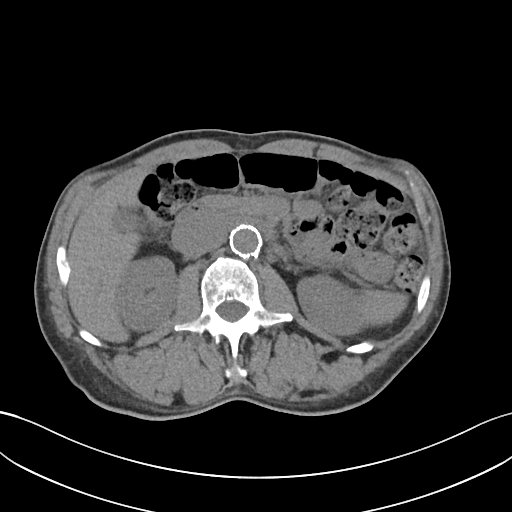
[im 61/94  bone]
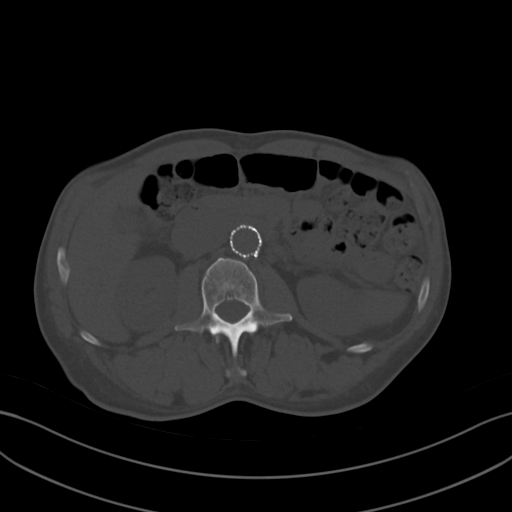
[im 66/94  soft-tissue]
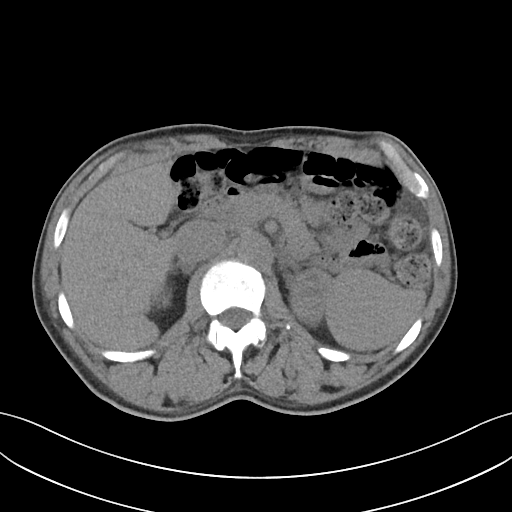
[im 75/94  soft-tissue]
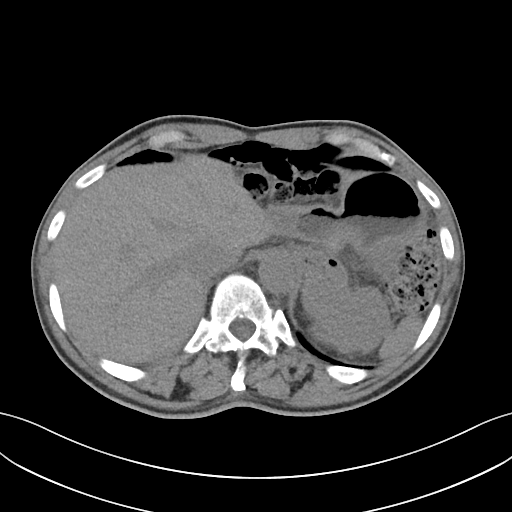
[im 80/94  soft-tissue]
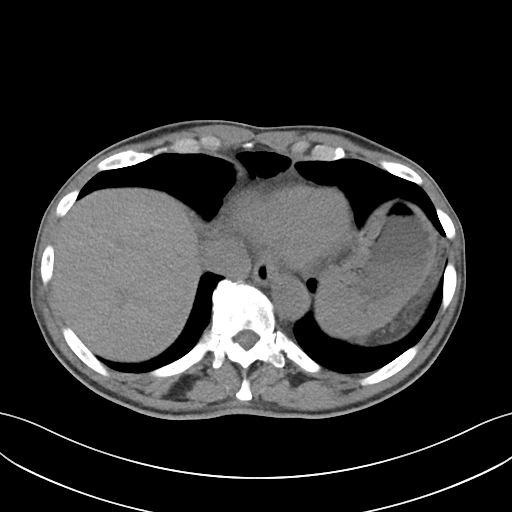
[im 89/94  soft-tissue]
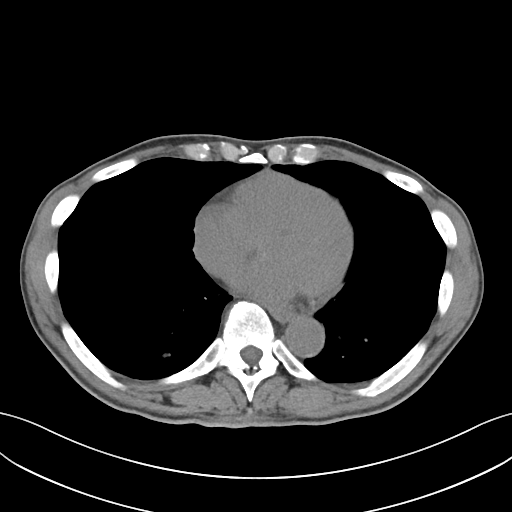

[Series 8: ap without f_0.6 · coronal · non-contrast · 0.83mm/px · 3 of 50 slices shown (2 of 2)]
[im 17/50  soft-tissue]
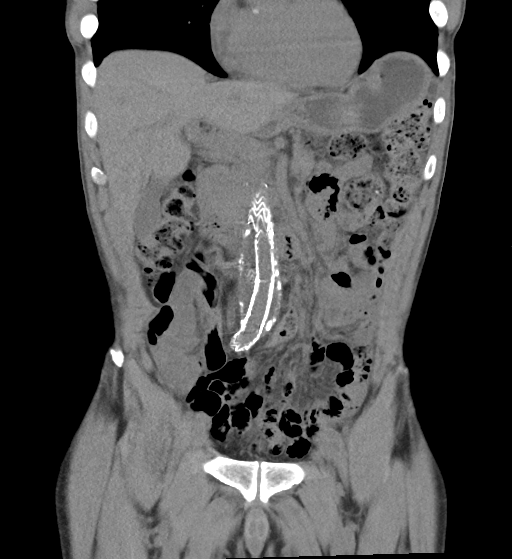
[im 22/50  soft-tissue]
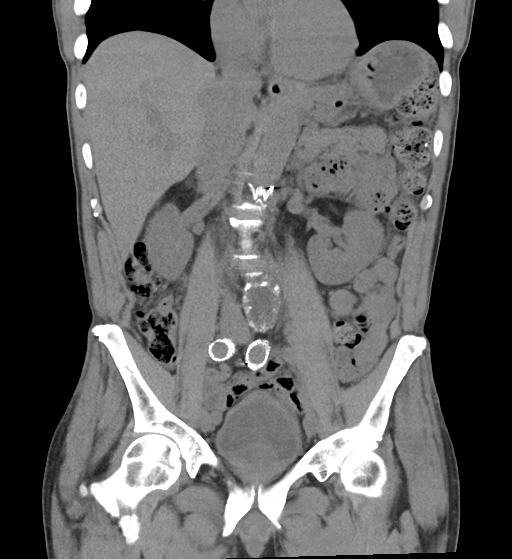
[im 28/50  soft-tissue]
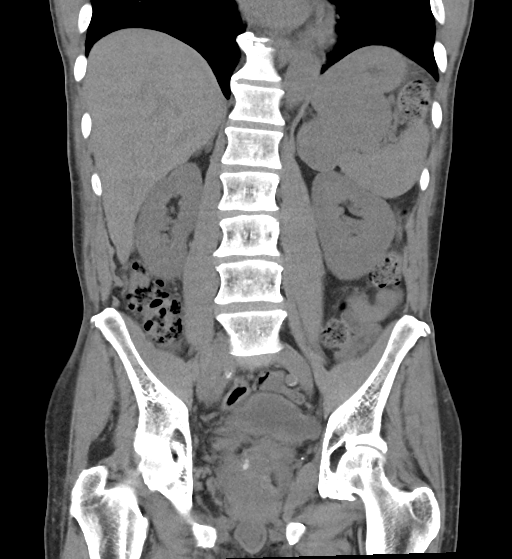

[16 of 46 positions shown; findings below may reference images not displayed]

FINDINGS: Evaluation of this exam is limited in the absence of intravenous
contrast.

Lower chest: Minimal bibasilar linear atelectasis/scarring. Mild
bilateral lower lobe bronchiectatic changes. The visualized lung
bases are otherwise clear. There is hypoattenuation of the cardiac
blood pool suggestive of a degree of anemia. Clinical correlation is
recommended.

No intra-abdominal free air or free fluid.

Hepatobiliary: No focal liver abnormality is seen. No gallstones,
gallbladder wall thickening, or biliary dilatation.

Pancreas: Unremarkable. No pancreatic ductal dilatation or
surrounding inflammatory changes.

Spleen: Normal in size without focal abnormality.

Adrenals/Urinary Tract: Stable 2 cm right adrenal adenoma. There is
a 5.7 x 7.3 cm mass in the left upper quadrant which appears to be
arising from the left adrenal gland. This is not characterized on
this CT. Please refer to the MRI report of 08/10/2014. There is no
hydronephrosis or nephrolithiasis on either side. The visualized
ureters appear unremarkable. There is a 2 cm left bladder Dvasinis
diverticulum.

Stomach/Bowel: There is moderate stool throughout the colon. There
is no bowel obstruction or active inflammation. The appendix is
normal.

Vascular/Lymphatic: There is aortoiliac atherosclerotic disease. An
endovascular stent graft repair of a 4.1 cm infrarenal abdominal
aortic aneurysm is noted. Evaluation of the vasculature and stent
graft is limited on this unenhanced CT. The aneurysmal sac however
appears smaller in diameter (previously measuring 5.2 cm). No portal
venous gas. There is no adenopathy.

Reproductive: Mild enlargement of the prostate gland.

Other: None

Musculoskeletal: Degenerative changes of the spine. No acute osseous
pathology.
IMPRESSION: 1. No acute intra-abdominal or pelvic pathology. No hydronephrosis
or nephrolithiasis.
2. No bowel obstruction or active inflammation. Moderate colonic
stool burden. Normal appendix.
3.  Aortic Atherosclerosis ([id number].M).
4. Aorta bi-iliac endovascular stent graft repair of an infrarenal
abdominal aortic aneurysm. Decrease in the size of the aneurysmal
sac compared to the prior CT.
5. Incompletely characterized left adrenal mass as seen on the prior
CT and MRI.

## 2020-08-11 ENCOUNTER — Inpatient Hospital Stay: Payer: Medicare HMO | Attending: Nurse Practitioner | Admitting: Nurse Practitioner

## 2022-01-02 ENCOUNTER — Encounter: Payer: Self-pay | Admitting: Gastroenterology

## 2022-01-30 ENCOUNTER — Encounter: Payer: Self-pay | Admitting: Gastroenterology

## 2022-04-04 ENCOUNTER — Encounter: Payer: Self-pay | Admitting: Gastroenterology

## 2022-09-24 ENCOUNTER — Encounter: Payer: Self-pay | Admitting: Gastroenterology

## 2022-12-26 ENCOUNTER — Ambulatory Visit: Payer: Medicare HMO | Admitting: Gastroenterology

## 2022-12-26 ENCOUNTER — Encounter: Payer: Self-pay | Admitting: Gastroenterology

## 2022-12-26 VITALS — BP 118/80 | HR 83 | Ht 75.0 in | Wt 184.0 lb

## 2022-12-26 DIAGNOSIS — K31A0 Gastric intestinal metaplasia, unspecified: Secondary | ICD-10-CM

## 2022-12-26 DIAGNOSIS — K5900 Constipation, unspecified: Secondary | ICD-10-CM | POA: Diagnosis not present

## 2022-12-26 DIAGNOSIS — Z86010 Personal history of colonic polyps: Secondary | ICD-10-CM | POA: Diagnosis not present

## 2022-12-26 MED ORDER — SUTAB 1479-225-188 MG PO TABS: 24.0000 | ORAL_TABLET | Freq: Once | ORAL | 0 refills | Status: AC

## 2022-12-26 MED ORDER — LINACLOTIDE 145 MCG PO CAPS: 145.0000 ug | ORAL_CAPSULE | Freq: Every day | ORAL | 0 refills | Status: DC

## 2022-12-26 NOTE — Patient Instructions (Addendum)
You have been scheduled for an endoscopy and colonoscopy. Please follow the written instructions given to you at your visit today.  Please pick up your prep supplies at the pharmacy within the next 1-3 days.  If you use inhalers (even only as needed), please bring them with you on the day of your procedure.  DO NOT TAKE 7 DAYS PRIOR TO TEST- Trulicity (dulaglutide) Ozempic, Wegovy (semaglutide) Mounjaro (tirzepatide) Bydureon Bcise (exanatide extended release)  DO NOT TAKE 1 DAY PRIOR TO YOUR TEST Rybelsus (semaglutide) Adlyxin (lixisenatide) Victoza (liraglutide) Byetta (exanatide) ___________________________________________________________________________   We have given you samples of the following medication to take:  Linzess 145 mcg: Take 30 minutes before a meal  Please make sure you are taking consistently before your procedure so you are moving your bowels well.  Thank you for entrusting me with your care and for choosing [facility], Dr. Ileene Patrick    If your blood pressure at your visit was 140/90 or greater, please contact your primary care physician to follow up on this. ______________________________________________________  If you are age 76 or older, your body mass index should be between 23-30. Your Body mass index is 23 kg/m. If this is out of the aforementioned range listed, please consider follow up with your Primary Care Provider.  If you are age 70 or younger, your body mass index should be between 19-25. Your Body mass index is 23 kg/m. If this is out of the aformentioned range listed, please consider follow up with your Primary Care Provider.  ________________________________________________________  The [facility] providers would like to encourage you to use [facility] to communicate with providers for non-urgent requests or questions.  Due to long hold times on the telephone, sending your provider a message by [facility] may be a faster and  more efficient way to get a response.  Please allow 48 business hours for a response.  Please remember that this is for non-urgent requests.  _______________________________________________________  Due to recent changes in healthcare laws, you may see the results of your imaging and laboratory studies on MyChart before your provider has had a chance to review them.  We understand that in some cases there may be results that are confusing or concerning to you. Not all laboratory results come back in the same time frame and the provider may be waiting for multiple results in order to interpret others.  Please give [place] 48 hours in order for your provider to thoroughly review all the results before contacting the office for clarification of your results.

## 2022-12-26 NOTE — Progress Notes (Signed)
HPI :  72 y/o male with a history of gastric GIST s/p resection, gastric ulcer secondary to H pylori, gastric intestinal metaplasia, history of colon polyps, here for a visit to reestablish care for these issues, last seen in November 2020.  Recall he had a history of gastric ulcers a few years ago.  He was noted to have H. pylori which was treated with amoxicillin, clarithromycin, and Flagyl as well as PPI.  Repeat EGD in November 2020 showed interval healing of gastric ulcer and H. pylori eradication.  He had a gastric mapping procedure to reassess gastric intestinal metaplasia.  Biopsies in all areas of the stomach were positive for gastric intestinal metaplasia without dysplasia.  I had recommended a repeat EGD in 3 years or so after that exam for surveillance of this issue.  He denies any problems with his stomach.  He is on omeprazole daily, denies any reflux symptoms that bother him.  Is not taking any NSAIDs.  Denies any family history of gastric cancer or colon cancer.  Recall his gastric surgery was in 2019.  He is also had a history of colon polyps.  He had 9 polyps removed on colonoscopy in July 2020, recommended repeat exam 3 years later for surveillance purposes.  He denies any blood in the stools.  He has developed some constipation and has a bowel movement once every 3 days.  Had tried some MiraLAX but states it did not work too well for him.  He is currently using "smooth move" which does help stimulate a bowel movement when he takes it.  He denies any abdominal pains.  He otherwise has a history of a AAA that was stented in 2016.  He denies any chest pain or shortness of breath.  He feels well without any concerning symptoms that bother him currently.  He states he sees his primary care yearly.    EGD 12/26/18 - A 2 cm hiatal hernia was present. DH at 47cm from the incisors, GEJ and SCJ located 45cm from the incisors. - The exam of the esophagus was otherwise normal. 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 - One non-bleeding superficial gastric ulcer with no stigmata of bleeding was found on the lesser curvature of the gastric body. The lesion was 3 mm in largest dimension. Biopsies were taken with a cold forceps for histology from the periphery of it. - Patchy erythematous mucosa was found in the gastric body and in the gastric antrum. Biopsies were taken with a cold forceps for Helicobacter pylori testing. - The exam of the stomach was otherwise normal. - The duodenal bulb and second portion of the duodenum were normal.   Biopsies showed H pylori gastritis with GIM   Colonoscopy 12/26/18 - The perianal and digital rectal examinations were normal. - Five sessile polyps were found in the rectum. The polyps were 3 to 4 mm in size. These polyps were removed with a cold snare. Resection and retrieval were complete. - Two sessile polyps were found in the sigmoid colon. The polyps were 3 to 5 mm in size. These polyps were removed with a cold snare. Resection and retrieval were complete. - Two sessile polyps were found in the ascending colon. The polyps were 3 mm in size. These polyps were removed with a cold snare. Resection and retrieval were complete. - Scattered medium-mouthed diverticula were found in the entire colon. - Internal hemorrhoids were found during retroflexion. - The colon was extremely tortuous which  prolonged the exam. - The exam was otherwise without abnormality.   Polyps adenomatous / hyperplastic - repear in 3 years   EGD 04/10/2019: - Esophagogastric landmarks were identified: the Z-line was found at 45 cm, the gastroesophageal junction was found at 45 cm and the upper extent of the gastric folds was found at 47 cm from the incisors. Findings: - A 2 cm hiatal hernia was present. - The exam of the esophagus was otherwise normal. - A medium-sized paraesophageal hernia was found on retroflexed views - Localized  mildly erythematous mucosa was found in the gastric body at the site of the prior ulcer, but interval healing of the ulcer has occured. - The exam of the stomach was otherwise normal. - Biopsies were taken with a cold forceps in the gastric fundus, in the gastric body, at the incisura and in the gastric antrum for histology. - The duodenal bulb and second portion of the duodenum were normal.  1. Surgical [P], gastric antrum BX - ANTRAL MUCOSA WITH MILD CHRONIC GASTRITIS AND INTESTINAL METAPLASIA. - WARTHIN-STARRY NEGATIVE FOR HELICOBACTER PYLORI. - NO DYSPLASIA OR MALIGNANCY. 2. Surgical [P], gastric body BX - OXYNTIC MUCOSA WITH MILD CHRONIC GASTRITIS AND INTESTINAL METAPLASIA. - WARTHIN-STARRY NEGATIVE FOR HELICOBACTER PYLORI. - NO DYSPLASIA OR MALIGNANCY. 3. Surgical [P], gastric fundus BX - FUNDIC MUCOSA WITH MILD CHRONIC GASTRITIS AND INTESTINAL METAPLASIA. - WARTHIN-STARRY STAIN NEGATIVE FOR HELICOBACTER PYLORI. - NO DYSPLASIA OR MALIGNANCY.      Past Medical History:  Diagnosis Date   AAA (abdominal aortic aneurysm) (HCC)    GERD (gastroesophageal reflux disease)    Hyperchloremia    Hyperlipidemia    Hypertension    Irregular heartbeat    PVD (peripheral vascular disease) (HCC)    Substance abuse (HCC)    Vitamin D deficiency      Past Surgical History:  Procedure Laterality Date   ABDOMINAL AORTIC ENDOVASCULAR STENT GRAFT N/A 08/30/2014   Procedure: ABDOMINAL AORTIC ENDOVASCULAR STENT GRAFT;  Surgeon: Sherren Kerns, MD;  Location: [facility] OR;  Service: Vascular;  Laterality: N/A;   HERNIA REPAIR     bil groins " several time"   ROBOTIC ADRENALECTOMY     Dr. Liliane Shi 01-01-18   ROBOTIC ADRENALECTOMY Left 03/26/2018   Procedure: XI ROBOTIC PARTIAL  GASTRECTOMY;  Surgeon: Rene Paci, MD;  Location: [facility] ORS;  Service: Urology;  Laterality: Left;   TONSILLECTOMY     Family History  Problem Relation Age of Onset   Colon cancer Neg Hx    Esophageal cancer Neg Hx     Stomach cancer Neg Hx    Social History   Tobacco Use   Smoking status: Every Day    Current packs/day: 0.25    Average packs/day: 0.3 packs/day for 17.0 years (4.3 ttl pk-yrs)    Types: Cigarettes   Smokeless tobacco: Former    Types: Chew    Quit date: 01/20/1974   Tobacco comments:    3-4 cigarettes per day  Vaping Use   Vaping status: Never Used  Substance Use Topics   Alcohol use: Not Currently    Alcohol/week: 1.0 standard drink of alcohol    Types: 1 Standard drinks or equivalent per week    Comment: 1/2 pint per week   Drug use: Not Currently    Types: Marijuana, Cocaine    Comment: none in a long time per pt.   Current Outpatient Medications  Medication Sig Dispense Refill   atorvastatin (LIPITOR) 40 MG tablet Take 1 tablet (40  mg total) by mouth daily. 30 tablet 6   omeprazole (PRILOSEC) 20 MG capsule Take 1 capsule (20 mg total) by mouth daily. 30 capsule 3   tamsulosin (FLOMAX) 0.4 MG CAPS capsule Take 0.4 mg by mouth daily.     ciprofloxacin (CIPRO) 500 MG tablet Take 1 tablet (500 mg total) by mouth 2 (two) times daily. (Patient not taking: Reported on 12/26/2022) 10 tablet 0   famotidine (PEPCID) 20 MG tablet Take 1 tablet (20 mg total) by mouth daily. (Patient not taking: Reported on 12/26/2022) 90 tablet 1   losartan-hydrochlorothiazide (HYZAAR) 100-25 MG tablet Take 1 tablet by mouth every evening. (Patient not taking: Reported on 12/26/2022)     sildenafil (REVATIO) 20 MG tablet Take 1 tablet by mouth as needed. Dose is 2-5 tablets (Patient not taking: Reported on 12/26/2022)     sildenafil (VIAGRA) 100 MG tablet Take 100 mg by mouth as needed. (Patient not taking: Reported on 02/12/2020)     No current facility-administered medications for this visit.   Allergies  Allergen Reactions   Aspirin Nausea Only and Other (See Comments)    HIGH DOSE  Upset patients   stomach-      Review of Systems: All systems reviewed and negative except where noted in HPI.    Lab Results  Component Value Date   WBC 3.9 (L) 03/14/2018   HGB 13.1 03/27/2018   HCT 39.0 03/27/2018   MCV 96.3 03/14/2018   PLT 161 03/14/2018    Lab Results  Component Value Date   NA 143 02/12/2020   CL 106 02/12/2020   K 3.2 (L) 02/12/2020   CO2 29 02/12/2020   BUN 20 02/12/2020   CREATININE 1.56 (H) 02/12/2020   GFRNONAA 45 (L) 02/12/2020   CALCIUM 9.8 02/12/2020   ALBUMIN 4.0 02/06/2019   GLUCOSE 100 (H) 02/12/2020    Lab Results  Component Value Date   ALT 15 02/06/2019   AST 19 02/06/2019   ALKPHOS 74 02/06/2019   BILITOT 0.4 02/06/2019     Physical Exam: BP 118/80   Pulse 83   Ht 6\' 3"  (1.905 m)   Wt 184 lb (83.5 kg)   BMI 23.00 kg/m  Constitutional: Pleasant,well-developed, male in no acute distress. HEENT: Normocephalic and atraumatic. Conjunctivae are normal. No scleral icterus. Neck supple.  Cardiovascular: Normal rate, regular rhythm.  Pulmonary/chest: Effort normal and breath sounds normal.  Abdominal: Soft, nondistended, nontender.  There are no masses palpable.  Extremities: no edema Lymphadenopathy: No cervical adenopathy noted. Neurological: Alert and oriented to person place and time. Skin: Skin is warm and dry. No rashes noted. Psychiatric: Normal mood and affect. Behavior is normal.   ASSESSMENT: 72 y.o. male here for assessment of the following  1. Gastric intestinal metaplasia   2. History of colon polyps   3. Constipation, unspecified constipation type    History of H. pylori related gastric ulcer, H. pylori eradicated based on last endoscopy with healing of ulcers.  He had gastric mapping for gastric intestinal metaplasia over 3 years ago, biopsies show extensive GIM throughout his stomach without dysplasia.  He has no family history of gastric cancers, no upper tract symptoms that bother him on PPI.  We discussed GIM, increased risk for gastric cancer.  I offered him an EGD to survey this is stomach with gastric mapping  again, now more than 3 years out from his last exam.  We discussed what this entails, risks and benefits of anesthesia and EGD, he wants to  proceed.  At the same time he is overdue for colonoscopy for surveillance of colon polyps.  Recommend colonoscopy at the same time as his upper endoscopy.  Following discussion of this as well he wants to proceed.  He otherwise denies any cardiopulmonary symptoms and is feeling well.  We discussed his constipation, currently using smooth move as needed but I think would benefit from a daily laxative.  Sounds like low-dose MiraLAX was not effective, he could take that twice daily/increase dose, otherwise provided some free samples of Linzess 145 mcg to use daily as needed.  If he finds this beneficial we can provide prescription for him.  Recommend he stay on a bowel regimen especially in the days leading up to his colonoscopy.  He agreed  PLAN: - schedule EGD and colonoscopy at the [facility] as outlined above - trial of Linzess / day for constipation (he does not think Miralax works well)  Harlin Rain, MD [facility]  CC: Elie Confer, NP

## 2023-01-15 ENCOUNTER — Encounter: Payer: Self-pay | Admitting: Gastroenterology

## 2023-01-20 ENCOUNTER — Encounter: Payer: Self-pay | Admitting: Certified Registered Nurse Anesthetist

## 2023-01-24 ENCOUNTER — Encounter: Payer: Self-pay | Admitting: Gastroenterology

## 2023-01-24 ENCOUNTER — Telehealth: Payer: Self-pay | Admitting: Gastroenterology

## 2023-01-24 NOTE — Telephone Encounter (Signed)
PT is canceling EGD/colon for tomorrow 3pm due to a scheduling conflict

## 2023-01-24 NOTE — Telephone Encounter (Signed)
Sorry to hear this. Unfortunately due to late notice he should be charged a no show / late cancellation fee.

## 2023-01-25 ENCOUNTER — Encounter: Payer: Medicare HMO | Admitting: Gastroenterology

## 2023-03-03 ENCOUNTER — Encounter: Payer: Self-pay | Admitting: Certified Registered Nurse Anesthetist

## 2023-03-04 ENCOUNTER — Other Ambulatory Visit: Payer: Self-pay | Admitting: *Deleted

## 2023-03-04 MED ORDER — PLENVU 140 G PO SOLR: 1.0000 | ORAL | 0 refills | Status: DC

## 2023-03-04 NOTE — Progress Notes (Signed)
Pt came by office today to pick up update prep instructions. Looks like patient was originally scheduled 12/2022 but late cancelled. Was rescheduled by clerical staff, but updated instructions not created. New instructions created for patient.

## 2023-03-07 ENCOUNTER — Encounter: Payer: Self-pay | Admitting: Gastroenterology

## 2023-03-07 ENCOUNTER — Ambulatory Visit: Payer: Medicare HMO | Admitting: Gastroenterology

## 2023-03-07 VITALS — BP 123/88 | HR 68 | Temp 98.2°F | Resp 14 | Ht 75.0 in | Wt 184.0 lb

## 2023-03-07 DIAGNOSIS — K2950 Unspecified chronic gastritis without bleeding: Secondary | ICD-10-CM | POA: Diagnosis not present

## 2023-03-07 DIAGNOSIS — K31A0 Gastric intestinal metaplasia, unspecified: Secondary | ICD-10-CM | POA: Diagnosis present

## 2023-03-07 DIAGNOSIS — Z860100 Personal history of colon polyps, unspecified: Secondary | ICD-10-CM

## 2023-03-07 DIAGNOSIS — Z1211 Encounter for screening for malignant neoplasm of colon: Secondary | ICD-10-CM

## 2023-03-07 DIAGNOSIS — Z09 Encounter for follow-up examination after completed treatment for conditions other than malignant neoplasm: Secondary | ICD-10-CM

## 2023-03-07 MED ORDER — SODIUM CHLORIDE 0.9 % IV SOLN: 500.0000 mL | INTRAVENOUS | Status: DC

## 2023-03-07 NOTE — Progress Notes (Signed)
[facility] History and Physical   Primary Care Physician:  Elie Confer, NP   Reason for Procedure:   GIM, history of colon polyps  Plan:    EGD and colonoscopy     HPI: Eric Barnett is a 72 y.o. male  here for EGD and colonoscopy to evaluate GIM and history of colon polyps. Last EGD 03/2019 - had widespread GIM without dysplasia. EGD for gastric mapping , surveillance. Last colonoscopy 11/2018 with 9 adenomas removed.    Patient denies any bowel symptoms at this time. No family history of colon cancer known. Otherwise feels well without any cardiopulmonary symptoms.   I have discussed risks / benefits of anesthesia and endoscopic procedure with Dannial Monarch and they wish to proceed with the exams as outlined today.    Past Medical History:  Diagnosis Date   AAA (abdominal aortic aneurysm) (HCC) 08/30/2014   ABDOMINAL AORTIC ENDOVASCULAR STENT GRAFT   GERD (gastroesophageal reflux disease)    Hyperchloremia    Hyperlipidemia    Hypertension    Irregular heartbeat    PVD (peripheral vascular disease) (HCC)    Substance abuse (HCC)    Vitamin D deficiency     Past Surgical History:  Procedure Laterality Date   ABDOMINAL AORTIC ENDOVASCULAR STENT GRAFT N/A 08/30/2014   Procedure: ABDOMINAL AORTIC ENDOVASCULAR STENT GRAFT;  Surgeon: Sherren Kerns, MD;  Location: [facility] OR;  Service: Vascular;  Laterality: N/A;   COLONOSCOPY     HERNIA REPAIR     bil groins " several time"   ROBOTIC ADRENALECTOMY     Dr. Liliane Shi 01-01-18   ROBOTIC ADRENALECTOMY Left 03/26/2018   Procedure: XI ROBOTIC PARTIAL  GASTRECTOMY;  Surgeon: Rene Paci, MD;  Location: [facility] ORS;  Service: Urology;  Laterality: Left;   TONSILLECTOMY     UPPER GASTROINTESTINAL ENDOSCOPY      Prior to Admission medications   Medication Sig Start Date End Date Taking? Authorizing Provider  atorvastatin (LIPITOR) 40 MG tablet Take 1 tablet (40 mg total) by mouth daily. 08/24/14  Yes Runell Gess, MD  omeprazole (PRILOSEC) 20 MG capsule Take 1 capsule (20 mg total) by mouth daily. 02/25/20  Yes Tayllor Breitenstein, Willaim Rayas, MD  ciprofloxacin (CIPRO) 500 MG tablet Take 1 tablet (500 mg total) by mouth 2 (two) times daily. Patient not taking: Reported on 12/26/2022 02/12/20   Ladene Artist, MD  famotidine (PEPCID) 20 MG tablet Take 1 tablet (20 mg total) by mouth daily. Patient not taking: Reported on 12/26/2022 10/27/18   Benancio Deeds, MD  linaclotide [facility]) 145 MCG CAPS capsule Take 1 capsule (145 mcg total) by mouth daily before breakfast. Lot: [id number], Exp: 04-2023 Patient not taking: Reported on 03/07/2023 12/26/22   Beya Tipps, Willaim Rayas, MD  losartan-hydrochlorothiazide (HYZAAR) 100-25 MG tablet Take 1 tablet by mouth every evening. Patient not taking: Reported on 12/26/2022    [provider]  olmesartan (BENICAR) 20 MG tablet Take 20 mg by mouth daily. Patient not taking: Reported on 03/07/2023    [provider]  PEG-KCl-NaCl-NaSulf-Na Asc-C (PLENVU) 140 g SOLR Take 1 kit by mouth as directed. Use coupon: BIN: [id number] PNC: CNRX Group: [id number] ID: [id number] Patient not taking: Reported on 03/07/2023 03/04/23   Danitza Schoenfeldt, Willaim Rayas, MD  sildenafil (REVATIO) 20 MG tablet Take 1 tablet by mouth as needed. Dose is 2-5 tablets Patient not taking: Reported on 12/26/2022    [provider]  sildenafil (VIAGRA) 100 MG tablet Take 100 mg by mouth  as needed. Patient not taking: Reported on 02/12/2020    [provider]  tamsulosin (FLOMAX) 0.4 MG CAPS capsule Take 0.4 mg by mouth daily.    [provider]    Current Outpatient Medications  Medication Sig Dispense Refill   atorvastatin (LIPITOR) 40 MG tablet Take 1 tablet (40 mg total) by mouth daily. 30 tablet 6   omeprazole (PRILOSEC) 20 MG capsule Take 1 capsule (20 mg total) by mouth daily. 30 capsule 3   ciprofloxacin (CIPRO) 500 MG tablet Take 1 tablet (500 mg total) by mouth 2  (two) times daily. (Patient not taking: Reported on 12/26/2022) 10 tablet 0   famotidine (PEPCID) 20 MG tablet Take 1 tablet (20 mg total) by mouth daily. (Patient not taking: Reported on 12/26/2022) 90 tablet 1   linaclotide (LINZESS) 145 MCG CAPS capsule Take 1 capsule (145 mcg total) by mouth daily before breakfast. Lot: [id number], Exp: 04-2023 (Patient not taking: Reported on 03/07/2023) 8 capsule 0   losartan-hydrochlorothiazide (HYZAAR) 100-25 MG tablet Take 1 tablet by mouth every evening. (Patient not taking: Reported on 12/26/2022)     olmesartan (BENICAR) 20 MG tablet Take 20 mg by mouth daily. (Patient not taking: Reported on 03/07/2023)     PEG-KCl-NaCl-NaSulf-Na Asc-C (PLENVU) 140 g SOLR Take 1 kit by mouth as directed. Use coupon: BIN: [id number] PNC: CNRX Group: [id number] ID: [id number] (Patient not taking: Reported on 03/07/2023) 1 each 0   sildenafil (REVATIO) 20 MG tablet Take 1 tablet by mouth as needed. Dose is 2-5 tablets (Patient not taking: Reported on 12/26/2022)     sildenafil (VIAGRA) 100 MG tablet Take 100 mg by mouth as needed. (Patient not taking: Reported on 02/12/2020)     tamsulosin (FLOMAX) 0.4 MG CAPS capsule Take 0.4 mg by mouth daily.     Current Facility-Administered Medications  Medication Dose Route Frequency Provider Last Rate Last Admin   0.9 %  sodium chloride infusion  500 mL Intravenous Continuous Cleave Ternes, Willaim Rayas, MD        Allergies as of 03/07/2023 - Review Complete 03/07/2023  Allergen Reaction Noted   Aspirin Nausea Only and Other (See Comments)     Family History  Problem Relation Age of Onset   Colon cancer Neg Hx    Esophageal cancer Neg Hx    Stomach cancer Neg Hx    Liver cancer Neg Hx    Rectal cancer Neg Hx     Social History   Socioeconomic History   Marital status: Divorced    Spouse name: Not on file   Number of children: 0   Years of education: Not on file   Highest education level: Not on file  Occupational History    Occupation: Retired   Occupation: bus driver  Tobacco Use   Smoking status: Every Day    Current packs/day: 0.25    Average packs/day: 0.3 packs/day for 17.0 years (4.3 ttl pk-yrs)    Types: Cigarettes   Smokeless tobacco: Former    Types: Chew    Quit date: 01/20/1974   Tobacco comments:    3-4 cigarettes per day  Vaping Use   Vaping status: Never Used  Substance and Sexual Activity   Alcohol use: Not Currently    Alcohol/week: 1.0 standard drink of alcohol    Types: 1 Standard drinks or equivalent per week    Comment: 1/2 pint per week   Drug use: Not Currently    Types: Marijuana, Cocaine    Comment: none in a  long time per pt.   Sexual activity: Yes  Other Topics Concern   Not on file  Social History Narrative   Not on file   Social Determinants of Health   Financial Resource Strain: Not on file  Food Insecurity: Not on file  Transportation Needs: Not on file  Physical Activity: Not on file  Stress: Not on file  Social Connections: Unknown (10/09/2021)   Received from [facility], [facility]   Social Network    Social Network: Not on file  Intimate Partner Violence: Unknown (08/31/2021)   Received from [facility], [facility]   HITS    Physically Hurt: Not on file    Insult or Talk Down To: Not on file    Threaten Physical Harm: Not on file    Scream or Curse: Not on file    Review of Systems: All other review of systems negative except as mentioned in the HPI.  Physical Exam: Vital signs BP (!) 166/98   Pulse 78   Temp 98.2 F (36.8 C)   Ht 6\' 3"  (1.905 m)   Wt 184 lb (83.5 kg)   SpO2 97%   BMI 23.00 kg/m   General:   Alert,  Well-developed, pleasant and cooperative in NAD Lungs:  Clear throughout to auscultation.   Heart:  Regular rate and rhythm Abdomen:  Soft, nontender and nondistended.   Neuro/Psych:  Alert and cooperative. Normal mood and affect. A and O x 3  Harlin Rain, MD [facility]

## 2023-03-07 NOTE — Progress Notes (Signed)
Report given to PACU, vss 

## 2023-03-07 NOTE — Progress Notes (Signed)
1415 Robinul 0.1 mg IV given due large amount of secretions upon assessment.  MD made aware, vss  ?

## 2023-03-07 NOTE — Patient Instructions (Signed)
Resume previous diet. Continue present medications. Repeat colonoscopy in 5 years for surveillance given burden of polyps on last exam.  Handouts on diverticulosis and hemorrhoids provided.  YOU HAD AN ENDOSCOPIC PROCEDURE TODAY AT THE [facility]:   Refer to the procedure report that was given to you for any specific questions about what was found during the examination.  If the procedure report does not answer your questions, please call your gastroenterologist to clarify.  If you requested that your care partner not be given the details of your procedure findings, then the procedure report has been included in a sealed envelope for you to review at your convenience later.  YOU SHOULD EXPECT: Some feelings of bloating in the abdomen. Passage of more gas than usual.  Walking can help get rid of the air that was put into your GI tract during the procedure and reduce the bloating. If you had a lower endoscopy (such as a colonoscopy or flexible sigmoidoscopy) you may notice spotting of blood in your stool or on the toilet paper. If you underwent a bowel prep for your procedure, you may not have a normal bowel movement for a few days.  Please Note:  You might notice some irritation and congestion in your nose or some drainage.  This is from the oxygen used during your procedure.  There is no need for concern and it should clear up in a day or so.  SYMPTOMS TO REPORT IMMEDIATELY:  Following lower endoscopy (colonoscopy or flexible sigmoidoscopy):  Excessive amounts of blood in the stool  Significant tenderness or worsening of abdominal pains  Swelling of the abdomen that is new, acute  Fever of 100F or higher  Following upper endoscopy (EGD)  Vomiting of blood or coffee ground material  New chest pain or pain under the shoulder blades  Painful or persistently difficult swallowing  New shortness of breath  Fever of 100F or higher  Black, tarry-looking stools  For urgent or  emergent issues, a gastroenterologist can be reached at any hour by calling [phone]. Do not use MyChart messaging for urgent concerns.    DIET:  We do recommend a small meal at first, but then you may proceed to your regular diet.  Drink plenty of fluids but you should avoid alcoholic beverages for 24 hours.  ACTIVITY:  You should plan to take it easy for the rest of today and you should NOT DRIVE or use heavy machinery until tomorrow (because of the sedation medicines used during the test).    FOLLOW UP: Our staff will call the number listed on your records the next business day following your procedure.  We will call around 7:15- 8:00 am to check on you and address any questions or concerns that you may have regarding the information given to you following your procedure. If we do not reach you, we will leave a message.     If any biopsies were taken you will be contacted by phone or by letter within the next 1-3 weeks.  Please call us at [phone] if you have not heard about the biopsies in 3 weeks.    SIGNATURES/CONFIDENTIALITY: You and/or your care partner have signed paperwork which will be entered into your electronic medical record.  These signatures attest to the fact that that the information above on your After Visit Summary has been reviewed and is understood.  Full responsibility of the confidentiality of this discharge information lies with you and/or your care-partner.

## 2023-03-07 NOTE — Op Note (Signed)
[facility] Patient Name: Eric Barnett Procedure Date: 03/07/2023 2:15 PM MRN: [id number] Endoscopist: Viviann Spare P. Adela Lank , MD, [id number] Age: 72 Referring MD:  Date of Birth: March 25, 1951 Gender: Male Account #: [id number] Procedure:                Colonoscopy Indications:              High risk colon cancer surveillance: Personal                            history of colonic polyps - 9 polyps removed 11/2018 Medicines:                Monitored Anesthesia Care Procedure:                Pre-Anesthesia Assessment:                           - Prior to the procedure, a History and Physical                            was performed, and patient medications and                            allergies were reviewed. The patient's tolerance of                            previous anesthesia was also reviewed. The risks                            and benefits of the procedure and the sedation                            options and risks were discussed with the patient.                            All questions were answered, and informed consent                            was obtained. Prior Anticoagulants: The patient has                            taken no anticoagulant or antiplatelet agents. ASA                            Grade Assessment: III - A patient with severe                            systemic disease. After reviewing the risks and                            benefits, the patient was deemed in satisfactory                            condition to undergo the procedure.  After obtaining informed consent, the colonoscope                            was passed under direct vision. Throughout the                            procedure, the patient's blood pressure, pulse, and                            oxygen saturations were monitored continuously. The                            CF [id number] #[id number] was introduced through the anus                             and advanced to the the cecum, identified by                            appendiceal orifice and ileocecal valve. The                            colonoscopy was performed without difficulty. The                            patient tolerated the procedure well. The quality                            of the bowel preparation was good. The ileocecal                            valve, appendiceal orifice, and rectum were                            photographed. Scope In: 2:30:36 PM Scope Out: 2:45:19 PM Scope Withdrawal Time: 0 hours 8 minutes 37 seconds  Total Procedure Duration: 0 hours 14 minutes 43 seconds  Findings:                 The perianal and digital rectal examinations were                            normal.                           Medium-mouthed diverticula were found in the left                            colon and right colon (highest burden in the right                            colon).                           Internal hemorrhoids were found during retroflexion.  There was a Medical illustrator in the right colon.                            The exam was otherwise without abnormality. Complications:            No immediate complications. Estimated blood loss:                            None. Estimated Blood Loss:     Estimated blood loss: none. Impression:               - Diverticulosis in the left colon and in the right                            colon.                           - Internal hemorrhoids.                           - The examination was otherwise normal.                           - No polyps. Recommendation:           - Patient has a contact number available for                            emergencies. The signs and symptoms of potential                            delayed complications were discussed with the                            patient. Return to normal activities tomorrow.                            Written discharge instructions  were provided to the                            patient.                           - Resume previous diet.                           - Continue present medications.                           - Repeat colonoscopy in 5 years for surveillance                            given burden of polyps on the last exam. Viviann Spare P. Xaine Sansom, MD 03/07/2023 2:51:05 PM This report has been signed electronically.

## 2023-03-07 NOTE — Op Note (Signed)
[facility] Patient Name: Eric Barnett Procedure Date: 03/07/2023 2:17 PM MRN: [id number] Endoscopist: Viviann Spare P. 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 The patient's tolerance of                            previous anesthesia was also reviewed. The risks                            and benefits of the procedure and the sedation                            options and risks were discussed with the patient.                            All questions were answered, and informed consent                            was obtained. Prior Anticoagulants: The patient has                            taken no anticoagulant or antiplatelet agents. ASA                            Grade Assessment: III - A patient with severe                            systemic disease. After reviewing the risks and                            benefits, the patient was deemed in satisfactory  condition to undergo the procedure.                           After obtaining informed consent, the endoscope was                            passed under direct vision. Throughout the                            procedure, the patient's blood pressure, pulse, and                            oxygen saturations were monitored continuously. The                            GIF [id number] #[id number] was  introduced through the                            mouth, and advanced to the second part of duodenum.                            The upper GI endoscopy was accomplished without                            difficulty. The patient tolerated the procedure                            well. Scope In: Scope Out: Findings:                 The Z-line was regular.                           A small hiatal hernia was present.                           The exam of the esophagus was otherwise normal.                           A paraesophageal hiatal hernia was present on                            retroflexed views.                           The exam of the stomach was otherwise normal, other                            than mild textural change in the incisura.                           Biopsies were taken with a cold forceps in the                            gastric fundus, in the gastric body, at the  incisura and in the gastric antrum for histology                            for gastric mapping.                           The examined duodenum was normal. Complications:            No immediate complications. Estimated blood loss:                            Minimal. Estimated Blood Loss:     Estimated blood loss was minimal. Impression:               - Z-line regular.                           - Small hiatal hernia.                           - Normal esophagus otherwise.                           - Paraesophageal hiatal hernia.                           - Mild textural change in the incisura.                           - Normal stomach otherwise - biopsies taken for                            gastric mapping.                           - Normal examined duodenum. Recommendation:           - Patient has a contact number available for                            emergencies. The signs and symptoms of potential                            delayed complications were discussed with  the                            patient. Return to normal activities tomorrow.                            Written discharge instructions were provided to the                            patient.                           - Resume previous diet.                           -  Continue present medications.                           - Await pathology results with further                            recommendations. Viviann Spare P. Jovanka Westgate, MD 03/07/2023 2:56:15 PM This report has been signed electronically.

## 2023-03-07 NOTE — Progress Notes (Signed)
Called to room to assist during endoscopic procedure.  Patient ID and intended procedure confirmed with present staff. Received instructions for my participation in the procedure from the performing physician.  

## 2023-03-08 ENCOUNTER — Telehealth: Payer: Self-pay

## 2023-03-08 NOTE — Telephone Encounter (Signed)
Attempted f/u call. No answer, left VM. 

## 2023-03-12 ENCOUNTER — Encounter: Payer: Self-pay | Admitting: Gastroenterology

## 2023-03-12 LAB — SURGICAL PATHOLOGY

## 2023-03-23 ENCOUNTER — Encounter (HOSPITAL_COMMUNITY): Payer: Self-pay | Admitting: *Deleted

## 2023-03-23 ENCOUNTER — Other Ambulatory Visit: Payer: Self-pay

## 2023-03-23 ENCOUNTER — Emergency Department (HOSPITAL_COMMUNITY): Payer: Medicare HMO

## 2023-03-23 ENCOUNTER — Emergency Department (HOSPITAL_COMMUNITY)
Admission: EM | Admit: 2023-03-23 | Discharge: 2023-03-23 | Disposition: A | Discharge status: Home / Self Care | Payer: Medicare HMO | Attending: Emergency Medicine | Admitting: Emergency Medicine

## 2023-03-23 DIAGNOSIS — R42 Dizziness and giddiness: Secondary | ICD-10-CM | POA: Diagnosis not present

## 2023-03-23 DIAGNOSIS — M25552 Pain in left hip: Secondary | ICD-10-CM | POA: Insufficient documentation

## 2023-03-23 DIAGNOSIS — I951 Orthostatic hypotension: Secondary | ICD-10-CM

## 2023-03-23 LAB — COMPREHENSIVE METABOLIC PANEL
ALT: 26 U/L (ref 0–44)
AST: 27 U/L (ref 15–41)
Albumin: 3.4 g/dL — ABNORMAL LOW (ref 3.5–5.0)
Alkaline Phosphatase: 71 U/L (ref 38–126)
Anion gap: 8 (ref 5–15)
BUN: 17 mg/dL (ref 8–23)
CO2: 25 mmol/L (ref 22–32)
Calcium: 9.9 mg/dL (ref 8.9–10.3)
Chloride: 108 mmol/L (ref 98–111)
Creatinine, Ser: 1.23 mg/dL (ref 0.61–1.24)
GFR, Estimated: >60 mL/min (ref >60)
Glucose, Bld: 101 mg/dL — ABNORMAL HIGH (ref 70–99)
Potassium: 4.3 mmol/L (ref 3.5–5.1)
Sodium: 141 mmol/L (ref 135–145)
Total Bilirubin: 1.3 mg/dL — ABNORMAL HIGH (ref 0.3–1.2)
Total Protein: 6.1 g/dL — ABNORMAL LOW (ref 6.5–8.1)

## 2023-03-23 LAB — CBC
HCT: 39.3 % (ref 39.0–52.0)
Hemoglobin: 12.9 g/dL — ABNORMAL LOW (ref 13.0–17.0)
MCH: 31.4 pg (ref 26.0–34.0)
MCHC: 32.8 g/dL (ref 30.0–36.0)
MCV: 95.6 fL (ref 80.0–100.0)
Platelets: 164 10*3/uL (ref 150–400)
RBC: 4.11 MIL/uL — ABNORMAL LOW (ref 4.22–5.81)
RDW: 12.9 % (ref 11.5–15.5)
WBC: 5 10*3/uL (ref 4.0–10.5)
nRBC: 0 % (ref 0.0–0.2)

## 2023-03-23 MED ORDER — LACTATED RINGERS IV BOLUS
1000.0000 mL | Freq: Once | INTRAVENOUS | Status: AC
  Administered 2023-03-23: 1000 mL via INTRAVENOUS

## 2023-03-23 MED ORDER — METHOCARBAMOL 500 MG PO TABS: 500.0000 mg | ORAL_TABLET | Freq: Two times a day (BID) | ORAL | 0 refills | Status: AC

## 2023-03-23 NOTE — ED Provider Notes (Signed)
[place] EMERGENCY DEPARTMENT AT [facility] Provider Note   CSN: [id number] Arrival date & time: 03/23/23  1456     History  Chief Complaint  Patient presents with   Dizziness   Hip Pain    Eric Barnett is a 72 y.o. male.  73 year old male presents today for concern of syncope.  He states he obtained a cigarette from an acquaintance.  He states the cigarette was the same brand that he always smokes however this 1 tasted different.  He only took 1 puff but states he immediately felt lightheaded and passed out.  He is concerned about poisoning.  He states he has not been drinking significant amount of fluids as of late.  Endorsed lightheadedness prior to the syncope.  No chest pain, shortness of breath, palpitations, or other concern.  The history is provided by the patient. No language interpreter was used.       Home Medications Prior to Admission medications   Medication Sig Start Date End Date Taking? Authorizing Provider  atorvastatin (LIPITOR) 40 MG tablet Take 1 tablet (40 mg total) by mouth daily. 08/24/14   Runell Gess, MD  ciprofloxacin (CIPRO) 500 MG tablet Take 1 tablet (500 mg total) by mouth 2 (two) times daily. Patient not taking: Reported on 12/26/2022 02/12/20   Ladene Artist, MD  famotidine (PEPCID) 20 MG tablet Take 1 tablet (20 mg total) by mouth daily. Patient not taking: Reported on 12/26/2022 10/27/18   Benancio Deeds, MD  linaclotide [facility]) 145 MCG CAPS capsule Take 1 capsule (145 mcg total) by mouth daily before breakfast. Lot: [id number], Exp: 04-2023 Patient not taking: Reported on 03/07/2023 12/26/22   Armbruster, Willaim Rayas, MD  losartan-hydrochlorothiazide (HYZAAR) 100-25 MG tablet Take 1 tablet by mouth every evening. Patient not taking: Reported on 12/26/2022    [provider]  olmesartan (BENICAR) 20 MG tablet Take 20 mg by mouth daily. Patient not taking: Reported on 03/07/2023    [provider]  omeprazole  (PRILOSEC) 20 MG capsule Take 1 capsule (20 mg total) by mouth daily. 02/25/20   Armbruster, Willaim Rayas, MD  PEG-KCl-NaCl-NaSulf-Na Asc-C (PLENVU) 140 g SOLR Take 1 kit by mouth as directed. Use coupon: BIN: [id number] PNC: CNRX Group: [id number] ID: [id number] Patient not taking: Reported on 03/07/2023 03/04/23   Armbruster, Willaim Rayas, MD  sildenafil (REVATIO) 20 MG tablet Take 1 tablet by mouth as needed. Dose is 2-5 tablets Patient not taking: Reported on 12/26/2022    [provider]  sildenafil (VIAGRA) 100 MG tablet Take 100 mg by mouth as needed. Patient not taking: Reported on 02/12/2020    [provider]  tamsulosin (FLOMAX) 0.4 MG CAPS capsule Take 0.4 mg by mouth daily.    [provider]      Allergies    Aspirin    Review of Systems   Review of Systems  Constitutional:  Negative for chills and fever.  Cardiovascular:  Negative for palpitations and leg swelling.  Neurological:  Positive for light-headedness.  All other systems reviewed and are negative.   Physical Exam Updated Vital Signs BP (!) 160/107   Pulse 89   Temp 98 F (36.7 C) (Oral)   Resp 17   Ht 6\' 3"  (1.905 m)   Wt 83.5 kg   SpO2 100%   BMI 23.01 kg/m  Physical Exam Vitals and nursing note reviewed.  Constitutional:      General: He is not in acute distress.    Appearance:  Normal appearance. He is not ill-appearing.  HENT:     Head: Normocephalic and atraumatic.     Nose: Nose normal.  Eyes:     General: No scleral icterus.    Extraocular Movements: Extraocular movements intact.     Conjunctiva/sclera: Conjunctivae normal.  Cardiovascular:     Rate and Rhythm: Normal rate and regular rhythm.     Heart sounds: Normal heart sounds.  Pulmonary:     Effort: Pulmonary effort is normal. No respiratory distress.     Breath sounds: Normal breath sounds. No wheezing or rales.  Abdominal:     General: There is no distension.     Tenderness: There is no abdominal tenderness.   Musculoskeletal:        General: Normal range of motion.     Cervical back: Normal range of motion.     Right lower leg: No edema.     Left lower leg: No edema.  Skin:    General: Skin is warm and dry.  Neurological:     General: No focal deficit present.     Mental Status: He is alert. Mental status is at baseline.     ED Results / Procedures / Treatments   Labs (all labs ordered are listed, but only abnormal results are displayed) Labs Reviewed  CBC - Abnormal; Notable for the following components:      Result Value   RBC 4.11 (*)    Hemoglobin 12.9 (*)    All other components within normal limits  COMPREHENSIVE METABOLIC PANEL    EKG None  Radiology No results found.  Procedures Procedures    Medications Ordered in ED Medications  lactated ringers bolus 1,000 mL (1,000 mLs Intravenous New Bag/Given 03/23/23 1646)    ED Course/ Medical Decision Making/ A&P                                 Medical Decision Making Risk Prescription drug management.   Medical Decision Making / ED Course   This patient presents to the ED for concern of syncope, this involves an extensive number of treatment options, and is a complaint that carries with it a high risk of complications and morbidity.  The differential diagnosis includes orthostasis, dehydration, arrhythmia, vasovagal  MDM: 72 year old male presents today for concern of syncope.  Hemodynamically stable.  Orthostatics performed at bedside and patient's systolic dropped about 20 points.  Positive orthostasis.  Will give fluids.  Will obtain additional workup.  Will reevaluate.  Improved on reevaluation.  Still somewhat orthostatic.  Discussed continue to drink plenty of fluids.  X-ray without fracture.  Robaxin given.  Patient discharged in stable condition.  Return precaution discussed.  Patient voices understanding and is in agreement with plan.  Discussed with attending who also evaluated patient at  bedside.  Lab Tests: -I ordered, reviewed, and interpreted labs.   The pertinent results include:   Labs Reviewed  COMPREHENSIVE METABOLIC PANEL - Abnormal; Notable for the following components:      Result Value   Glucose, Bld 101 (*)    Total Protein 6.1 (*)    Albumin 3.4 (*)    Total Bilirubin 1.3 (*)    All other components within normal limits  CBC - Abnormal; Notable for the following components:   RBC 4.11 (*)    Hemoglobin 12.9 (*)    All other components within normal limits      EKG  EKG Interpretation Date/Time:  Saturday March 23 2023 15:09:21 EDT Ventricular Rate:  91 PR Interval:  226 QRS Duration:  132 QT Interval:  404 QTC Calculation: 496 R Axis:   -79  Text Interpretation: Sinus rhythm with 1st degree A-V block with occasional Premature ventricular complexes Right bundle branch block Left anterior fascicular block Bifascicular block Septal infarct , age undetermined Abnormal ECG When compared with ECG of 27-Dec-2017 14:02, PREVIOUS ECG IS PRESENT Confirmed by Richardean Canal [phone]) on 03/23/2023 5:36:01 PM         Imaging Studies ordered: I ordered imaging studies including left hip I independently visualized and interpreted imaging. I agree with the radiologist interpretation   Medicines ordered and prescription drug management: Meds ordered this encounter  Medications   lactated ringers bolus 1,000 mL   methocarbamol (ROBAXIN) 500 MG tablet    Sig: Take 1 tablet (500 mg total) by mouth 2 (two) times daily.    Dispense:  20 tablet    Refill:  0    Order Specific Question:   Supervising Provider    Answer:   Hyacinth Meeker, BRIAN [[id number]]    -I have reviewed the patients home medicines and have made adjustments as needed  Reevaluation: After the interventions noted above, I reevaluated the patient and found that they have :improved  Co morbidities that complicate the patient evaluation  Past Medical History:  Diagnosis Date   AAA (abdominal  aortic aneurysm) (HCC) 08/30/2014   ABDOMINAL AORTIC ENDOVASCULAR STENT GRAFT   GERD (gastroesophageal reflux disease)    Hyperchloremia    Hyperlipidemia    Hypertension    Irregular heartbeat    PVD (peripheral vascular disease) (HCC)    Substance abuse (HCC)    Vitamin D deficiency       Dispostion: Discharged in stable condition.  Return precaution discussed.  Patient voices understanding and is in agreement with plan.   Final Clinical Impression(s) / ED Diagnoses Final diagnoses:  None    Rx / DC Orders ED Discharge Orders     None         Eric Kansas, PA-C 03/23/23 1841    Charlynne Pander, MD 03/23/23 2329

## 2023-03-23 NOTE — ED Notes (Signed)
PT to X-ray

## 2023-03-23 NOTE — ED Triage Notes (Signed)
The pt reports that he was lightheaded yesterday and he fell from being off balance  he has no previous history of the same  he has had lt hip pain whenhe walks since the fall yesterday

## 2023-03-23 NOTE — Discharge Instructions (Signed)
Your workup was reassuring.  No fracture on your x-ray.  You received fluids in the emergency department.  Continue to drink plenty of fluids.  For any concerning symptoms return to the emergency room.

## 2023-03-25 ENCOUNTER — Telehealth: Payer: Self-pay | Admitting: *Deleted

## 2023-03-25 NOTE — Telephone Encounter (Signed)
Pharmacy called related to Rx: Robaxin .Marland KitchenMarland KitchenEDCM clarified with EDP Roxan Hockey) to change Rx to: Tizanidine 4 mg tablets every 6 hours and needed for pain; #30, 0 refills.

## 2023-10-07 DIAGNOSIS — N401 Enlarged prostate with lower urinary tract symptoms: Secondary | ICD-10-CM | POA: Diagnosis not present

## 2023-10-07 DIAGNOSIS — R944 Abnormal results of kidney function studies: Secondary | ICD-10-CM | POA: Diagnosis not present

## 2023-10-07 DIAGNOSIS — I714 Abdominal aortic aneurysm, without rupture, unspecified: Secondary | ICD-10-CM | POA: Diagnosis not present

## 2023-10-07 DIAGNOSIS — N390 Urinary tract infection, site not specified: Secondary | ICD-10-CM | POA: Diagnosis not present

## 2023-10-07 DIAGNOSIS — K219 Gastro-esophageal reflux disease without esophagitis: Secondary | ICD-10-CM | POA: Diagnosis not present

## 2023-10-07 DIAGNOSIS — I10 Essential (primary) hypertension: Secondary | ICD-10-CM | POA: Diagnosis not present

## 2023-10-07 DIAGNOSIS — E782 Mixed hyperlipidemia: Secondary | ICD-10-CM | POA: Diagnosis not present

## 2023-10-07 DIAGNOSIS — R3129 Other microscopic hematuria: Secondary | ICD-10-CM | POA: Diagnosis not present

## 2023-11-25 DIAGNOSIS — E785 Hyperlipidemia, unspecified: Secondary | ICD-10-CM | POA: Diagnosis not present

## 2023-11-25 DIAGNOSIS — N1832 Chronic kidney disease, stage 3b: Secondary | ICD-10-CM | POA: Diagnosis not present

## 2023-11-25 DIAGNOSIS — Z008 Encounter for other general examination: Secondary | ICD-10-CM | POA: Diagnosis not present

## 2023-11-25 DIAGNOSIS — R2681 Unsteadiness on feet: Secondary | ICD-10-CM | POA: Diagnosis not present

## 2023-11-25 DIAGNOSIS — I129 Hypertensive chronic kidney disease with stage 1 through stage 4 chronic kidney disease, or unspecified chronic kidney disease: Secondary | ICD-10-CM | POA: Diagnosis not present

## 2023-11-26 DIAGNOSIS — N401 Enlarged prostate with lower urinary tract symptoms: Secondary | ICD-10-CM | POA: Diagnosis not present

## 2023-11-26 DIAGNOSIS — R3912 Poor urinary stream: Secondary | ICD-10-CM | POA: Diagnosis not present

## 2023-11-26 DIAGNOSIS — R351 Nocturia: Secondary | ICD-10-CM | POA: Diagnosis not present

## 2023-11-26 DIAGNOSIS — R3915 Urgency of urination: Secondary | ICD-10-CM | POA: Diagnosis not present

## 2023-11-26 DIAGNOSIS — R350 Frequency of micturition: Secondary | ICD-10-CM | POA: Diagnosis not present

## 2023-11-26 DIAGNOSIS — R3121 Asymptomatic microscopic hematuria: Secondary | ICD-10-CM | POA: Diagnosis not present

## 2023-12-12 ENCOUNTER — Other Ambulatory Visit: Payer: Self-pay | Admitting: *Deleted

## 2023-12-12 DIAGNOSIS — Z9889 Other specified postprocedural states: Secondary | ICD-10-CM

## 2023-12-25 NOTE — Progress Notes (Deleted)
 Patient ID: Eric Barnett, male   DOB: 06-16-1950, 73 y.o.   MRN: [id number]  Reason for Consult: No chief complaint on file.   Referred by Anastasio Duncans, DO  Subjective:     HPI Eric Barnett is a 73 y.o. male presenting for follow-up.  He has history of an EVAR that was performed in 2016 by Dr. Harvey.  He was last seen in 2019 and he was noted to have an adequately sealed aneurysm with a residual aneurysmal sac of 4 cm's and no endoleak.  He denies any new or unusual abdominal or back pain.  He is compliant with Lipitor but does not take aspirin  due to previous stomach issues.  Past Medical History:  Diagnosis Date   AAA (abdominal aortic aneurysm) (HCC) 08/30/2014   ABDOMINAL AORTIC ENDOVASCULAR STENT GRAFT   GERD (gastroesophageal reflux disease)    Hyperchloremia    Hyperlipidemia    Hypertension    Irregular heartbeat    PVD (peripheral vascular disease) (HCC)    Substance abuse (HCC)    Vitamin D deficiency    Family History  Problem Relation Age of Onset   Colon cancer Neg Hx    Esophageal cancer Neg Hx    Stomach cancer Neg Hx    Liver cancer Neg Hx    Rectal cancer Neg Hx    Past Surgical History:  Procedure Laterality Date   ABDOMINAL AORTIC ENDOVASCULAR STENT GRAFT N/A 08/30/2014   Procedure: ABDOMINAL AORTIC ENDOVASCULAR STENT GRAFT;  Surgeon: Carlin FORBES Harvey, MD;  Location: [facility] OR;  Service: Vascular;  Laterality: N/A;   COLONOSCOPY     HERNIA REPAIR     bil groins  several time   ROBOTIC ADRENALECTOMY     Dr. Devere 01-01-18   ROBOTIC ADRENALECTOMY Left 03/26/2018   Procedure: XI ROBOTIC PARTIAL  GASTRECTOMY;  Surgeon: Devere Lonni Righter, MD;  Location: [facility] ORS;  Service: Urology;  Laterality: Left;   TONSILLECTOMY     UPPER GASTROINTESTINAL ENDOSCOPY      Short Social History:  Social History   Tobacco Use   Smoking status: Every Day    Current packs/day: 0.25    Average packs/day: 0.3 packs/day for 17.0 years (4.3 ttl pk-yrs)    Types:  Cigarettes   Smokeless tobacco: Former    Types: Chew    Quit date: 01/20/1974   Tobacco comments:    3-4 cigarettes per day  Substance Use Topics   Alcohol use: Not Currently    Alcohol/week: 1.0 standard drink of alcohol    Types: 1 Standard drinks or equivalent per week    Comment: 1/2 pint per week    Allergies  Allergen Reactions   Aspirin  Nausea Only and Other (See Comments)    HIGH DOSE  Upset patients   stomach-     Current Outpatient Medications  Medication Sig Dispense Refill   atorvastatin  (LIPITOR) 40 MG tablet Take 1 tablet (40 mg total) by mouth daily. 30 tablet 6   ciprofloxacin  (CIPRO ) 500 MG tablet Take 1 tablet (500 mg total) by mouth 2 (two) times daily. (Patient not taking: Reported on 12/26/2022) 10 tablet 0   famotidine  (PEPCID ) 20 MG tablet Take 1 tablet (20 mg total) by mouth daily. (Patient not taking: Reported on 12/26/2022) 90 tablet 1   linaclotide  (LINZESS ) 145 MCG CAPS capsule Take 1 capsule (145 mcg total) by mouth daily before breakfast. Lot: [id number], Exp: 04-2023 (Patient not taking: Reported on 03/07/2023) 8 capsule 0   losartan -hydrochlorothiazide  (HYZAAR) 100-25  MG tablet Take 1 tablet by mouth every evening. (Patient not taking: Reported on 12/26/2022)     methocarbamol  (ROBAXIN ) 500 MG tablet Take 1 tablet (500 mg total) by mouth 2 (two) times daily. 20 tablet 0   olmesartan (BENICAR) 20 MG tablet Take 20 mg by mouth daily. (Patient not taking: Reported on 03/07/2023)     omeprazole  (PRILOSEC) 20 MG capsule Take 1 capsule (20 mg total) by mouth daily. 30 capsule 3   PEG-KCl-NaCl-NaSulf-Na Asc-C (PLENVU ) 140 g SOLR Take 1 kit by mouth as directed. Use coupon: BIN: [id number] PNC: CNRX Group: [id number] ID: [id number] (Patient not taking: Reported on 03/07/2023) 1 each 0   sildenafil (REVATIO) 20 MG tablet Take 1 tablet by mouth as needed. Dose is 2-5 tablets (Patient not taking: Reported on 12/26/2022)     sildenafil (VIAGRA) 100 MG tablet Take 100 mg by  mouth as needed. (Patient not taking: Reported on 02/12/2020)     tamsulosin (FLOMAX) 0.4 MG CAPS capsule Take 0.4 mg by mouth daily.     No current facility-administered medications for this visit.    REVIEW OF SYSTEMS  All other systems were reviewed and are negative     Objective:  Objective   There were no vitals filed for this visit. There is no height or weight on file to calculate BMI.  Physical Exam General: no acute distress Cardiac: hemodynamically stable Pulm: normal work of breathing Abdomen: non-tender, no pulsatile mass*** Neuro: alert, no focal deficit Extremities: no edema, cyanosis or wounds*** Vascular:   Right: ***  Left: ***  Data: AAA duplex ***     Assessment/Plan:   Eric Barnett is a 73 y.o. male with a history of AAA and underwent EVAR in 2016.  He was lost to follow-up and was last seen in 2019 where he was noted to have a sealed aneurysm with a residual sac of 4 cm's and no endoleak.  Today's AAA duplex demonstrates *** Plan to continue with annual surveillance and a repeat AAA duplex in 12 months  Recommendations to optimize cardiovascular risk: Abstinence from all tobacco products. Blood glucose control with goal A1c < 7%. Blood pressure control with goal blood pressure < 140/90 mmHg. Lipid reduction therapy with goal LDL-C <100 mg/dL  Aspirin  81mg  PO QD.  Atorvastatin  40-80mg  PO QD (or other high intensity statin therapy).   Norman GORMAN Serve MD [facility]

## 2023-12-27 ENCOUNTER — Ambulatory Visit (HOSPITAL_COMMUNITY): Attending: Vascular Surgery

## 2023-12-27 ENCOUNTER — Ambulatory Visit: Admitting: Vascular Surgery

## 2024-01-24 DIAGNOSIS — N401 Enlarged prostate with lower urinary tract symptoms: Secondary | ICD-10-CM | POA: Diagnosis not present

## 2024-01-29 DIAGNOSIS — R3121 Asymptomatic microscopic hematuria: Secondary | ICD-10-CM | POA: Diagnosis not present

## 2024-01-29 NOTE — Progress Notes (Unsigned)
 Patient ID: Eric Barnett, male   DOB: Jun 17, 1950, 73 y.o.   MRN: [id number]  Reason for Consult: No chief complaint on file.   Referred by Anastasio Duncans, DO  Subjective:     HPI Eric Barnett is a 73 y.o. male presenting for evaluation.  He has been lost to follow-up since 2019.  He does have history of a AAA that was treated with the EVAR in April 2016.  At that time his duplex looks good without any endoleak and a shrinking aneurysm sac.  He also has a history of claudication but his symptoms were improving in 2019 with a walking program and lifestyle changes. Today he reports ***  Past Medical History:  Diagnosis Date   AAA (abdominal aortic aneurysm) (HCC) 08/30/2014   ABDOMINAL AORTIC ENDOVASCULAR STENT GRAFT   GERD (gastroesophageal reflux disease)    Hyperchloremia    Hyperlipidemia    Hypertension    Irregular heartbeat    PVD (peripheral vascular disease) (HCC)    Substance abuse (HCC)    Vitamin D deficiency    Family History  Problem Relation Age of Onset   Colon cancer Neg Hx    Esophageal cancer Neg Hx    Stomach cancer Neg Hx    Liver cancer Neg Hx    Rectal cancer Neg Hx    Past Surgical History:  Procedure Laterality Date   ABDOMINAL AORTIC ENDOVASCULAR STENT GRAFT N/A 08/30/2014   Procedure: ABDOMINAL AORTIC ENDOVASCULAR STENT GRAFT;  Surgeon: Carlin FORBES Haddock, MD;  Location[facility] OR;  Service: Vascular;  Laterality: N/A;   COLONOSCOPY     HERNIA REPAIR     bil groins  several time   ROBOTIC ADRENALECTOMY     Dr. Devere 01-01-18   ROBOTIC ADRENALECTOMY Left 03/26/2018   Procedure: XI ROBOTIC PARTIAL  GASTRECTOMY;  Surgeon: Devere Lonni Righter, MD;  Location: [facility] ORS;  Service: Urology;  Laterality: Left;   TONSILLECTOMY     UPPER GASTROINTESTINAL ENDOSCOPY      Short Social History:  Social History   Tobacco Use   Smoking status: Every Day    Current packs/day: 0.25    Average packs/day: 0.3 packs/day for 17.0 years (4.3 ttl pk-yrs)    Types:  Cigarettes   Smokeless tobacco: Former    Types: Chew    Quit date: 01/20/1974   Tobacco comments:    3-4 cigarettes per day  Substance Use Topics   Alcohol use: Not Currently    Alcohol/week: 1.0 standard drink of alcohol    Types: 1 Standard drinks or equivalent per week    Comment: 1/2 pint per week    Allergies  Allergen Reactions   Aspirin  Nausea Only and Other (See Comments)    HIGH DOSE  Upset patients   stomach-     Current Outpatient Medications  Medication Sig Dispense Refill   atorvastatin  (LIPITOR) 40 MG tablet Take 1 tablet (40 mg total) by mouth daily. 30 tablet 6   ciprofloxacin  (CIPRO ) 500 MG tablet Take 1 tablet (500 mg total) by mouth 2 (two) times daily. (Patient not taking: Reported on 12/26/2022) 10 tablet 0   famotidine  (PEPCID ) 20 MG tablet Take 1 tablet (20 mg total) by mouth daily. (Patient not taking: Reported on 12/26/2022) 90 tablet 1   linaclotide  (LINZESS ) 145 MCG CAPS capsule Take 1 capsule (145 mcg total) by mouth daily before breakfast. Lot: [id number], Exp: 04-2023 (Patient not taking: Reported on 03/07/2023) 8 capsule 0   losartan -hydrochlorothiazide  (HYZAAR) 100-25 MG tablet  Take 1 tablet by mouth every evening. (Patient not taking: Reported on 12/26/2022)     methocarbamol  (ROBAXIN ) 500 MG tablet Take 1 tablet (500 mg total) by mouth 2 (two) times daily. 20 tablet 0   olmesartan (BENICAR) 20 MG tablet Take 20 mg by mouth daily. (Patient not taking: Reported on 03/07/2023)     omeprazole  (PRILOSEC) 20 MG capsule Take 1 capsule (20 mg total) by mouth daily. 30 capsule 3   PEG-KCl-NaCl-NaSulf-Na Asc-C (PLENVU ) 140 g SOLR Take 1 kit by mouth as directed. Use coupon: BIN: 980841 PNC: CNRX Group: [id number] ID: [id number] (Patient not taking: Reported on 03/07/2023) 1 each 0   sildenafil (REVATIO) 20 MG tablet Take 1 tablet by mouth as needed. Dose is 2-5 tablets (Patient not taking: Reported on 12/26/2022)     sildenafil (VIAGRA) 100 MG tablet Take 100 mg by  mouth as needed. (Patient not taking: Reported on 02/12/2020)     tamsulosin (FLOMAX) 0.4 MG CAPS capsule Take 0.4 mg by mouth daily.     No current facility-administered medications for this visit.    REVIEW OF SYSTEMS  All other systems were reviewed and are negative     Objective:  Objective   There were no vitals filed for this visit. There is no height or weight on file to calculate BMI.  Physical Exam General: no acute distress Cardiac: hemodynamically stable Pulm: normal work of breathing Abdomen: non-tender, no pulsatile mass*** Neuro: alert, no focal deficit Extremities: no edema, cyanosis or wounds*** Vascular:   Right: ***  Left: ***  Data: EVAR duplex *** Previously maximal diameter of 4 cm without endoleak   CMP reviewed, creatinine 1.2  ABI performed in 2019 Right 0.78 toe pressure 79, left 0.86 toe pressure 92     Assessment/Plan:   Eric Barnett is a 73 y.o. male with AAA who is s/p EVAR in 2016. ***  Recommendations to optimize cardiovascular risk: Abstinence from all tobacco products. Blood glucose control with goal A1c < 7%. Blood pressure control with goal blood pressure < 140/90 mmHg. Lipid reduction therapy with goal LDL-C <100 mg/dL  Aspirin  81mg  PO QD.  Atorvastatin  40-80mg  PO QD (or other high intensity statin therapy).   Norman GORMAN Serve MD [facility]

## 2024-01-30 ENCOUNTER — Telehealth: Payer: Self-pay

## 2024-01-30 NOTE — Telephone Encounter (Signed)
 Patient knows to not eat or drink anything after midnight prior to his EVAR US  on 01/31/24

## 2024-01-31 ENCOUNTER — Ambulatory Visit: Attending: Vascular Surgery | Admitting: Vascular Surgery

## 2024-01-31 ENCOUNTER — Encounter: Payer: Self-pay | Admitting: Vascular Surgery

## 2024-01-31 ENCOUNTER — Ambulatory Visit (HOSPITAL_COMMUNITY)
Admission: RE | Admit: 2024-01-31 | Discharge: 2024-01-31 | Disposition: A | Discharge status: Home / Self Care | Source: Ambulatory Visit | Attending: Vascular Surgery | Admitting: Vascular Surgery

## 2024-01-31 VITALS — BP 135/92 | HR 82 | Temp 97.4°F | Resp 20 | Ht 75.0 in | Wt 170.1 lb

## 2024-01-31 DIAGNOSIS — Z9889 Other specified postprocedural states: Secondary | ICD-10-CM | POA: Diagnosis not present

## 2024-01-31 DIAGNOSIS — I7143 Infrarenal abdominal aortic aneurysm, without rupture: Secondary | ICD-10-CM | POA: Diagnosis not present

## 2024-03-03 DIAGNOSIS — R350 Frequency of micturition: Secondary | ICD-10-CM | POA: Diagnosis not present

## 2024-03-04 DIAGNOSIS — N401 Enlarged prostate with lower urinary tract symptoms: Secondary | ICD-10-CM | POA: Diagnosis not present

## 2024-05-15 ENCOUNTER — Other Ambulatory Visit: Payer: Self-pay | Admitting: Urology

## 2024-05-15 DIAGNOSIS — N401 Enlarged prostate with lower urinary tract symptoms: Secondary | ICD-10-CM

## 2024-05-27 ENCOUNTER — Inpatient Hospital Stay: Admission: RE | Admit: 2024-05-27 | Source: Ambulatory Visit

## 2024-06-05 ENCOUNTER — Ambulatory Visit
Admission: RE | Admit: 2024-06-05 | Discharge: 2024-06-05 | Disposition: A | Source: Ambulatory Visit | Attending: Urology

## 2024-06-05 DIAGNOSIS — N401 Enlarged prostate with lower urinary tract symptoms: Secondary | ICD-10-CM

## 2024-06-05 MED ORDER — IOPAMIDOL (ISOVUE-370) INJECTION 76%
80.0000 mL | Freq: Once | INTRAVENOUS | Status: AC | PRN
  Administered 2024-06-05: 100 mL via INTRAVENOUS

## 2024-07-03 ENCOUNTER — Ambulatory Visit

## 2024-07-03 DIAGNOSIS — B351 Tinea unguium: Secondary | ICD-10-CM

## 2024-07-03 DIAGNOSIS — L600 Ingrowing nail: Secondary | ICD-10-CM

## 2024-07-03 DIAGNOSIS — I739 Peripheral vascular disease, unspecified: Secondary | ICD-10-CM

## 2024-07-03 NOTE — Progress Notes (Signed)
 "  Subjective:  Patient ID: Eric Barnett, male    DOB: [birth year],  MRN: [id number]  Chief Complaint  Patient presents with   Ingrown Toenail    Rm2 Ingrown right great toe/thick and painful nails/not diabetic    Discussed the use of AI scribe software for clinical note transcription with the patient, who gave verbal consent to proceed.  History of Present Illness Eric Barnett is a 74 year old male with peripheral arterial disease who presents for evaluation of a painful ingrown toenail of the right hallux.  He has a progressively worsening ingrown toenail along the distal medial border of the right hallux causing significant pain. He was unable to trim or remove the nail himself as he has done for prior similar episodes. He denies burning or tingling in the feet and does not have diabetes.  He has peripheral arterial disease and aneurysm with non-palpable pulses noted in the past. Prior non-invasive vascular studies were done about 5 to 6 years ago. He denies claudication or other subjective symptoms of poor blood flow.     Review of Systems: Negative except as noted in the HPI. Denies N/V/F/Ch.  Past Medical History:  Diagnosis Date   AAA (abdominal aortic aneurysm) 08/30/2014   ABDOMINAL AORTIC ENDOVASCULAR STENT GRAFT   GERD (gastroesophageal reflux disease)    Hyperchloremia    Hyperlipidemia    Hypertension    Irregular heartbeat    PVD (peripheral vascular disease)    Substance abuse (HCC)    Vitamin D deficiency    Current Medications[1]  Tobacco Use History[2]  Allergies[3] Objective:   Constitutional Well developed. Well nourished. Oriented to person, place, and time.  Vascular Dorsalis pedis pulses nonpalpable bilaterally. Posterior tibial pulses nonpalpable bilaterally. Capillary refill normal to all digits.  No cyanosis or clubbing noted. Pedal hair growth normal.  Neurologic Normal speech. Epicritic sensation to light touch grossly intact  bilaterally. Negative tinel sign at tarsal tunnel bilaterally.   Dermatologic Skin texture and turgor are within normal limits.  Right hallux medial border has mild ingrowing at the distal aspect.  No erythema, edema or signs of active infection.  Minor pain to palpation.  Nails 1 through 5 bilaterally thickened, dystrophic, crumbly in texture  Musculoskeletal: 5 out of 5 muscle strength, no contributing deformity      Assessment:  No diagnosis found.   Plan:  Patient was evaluated and treated and all questions answered.  Assessment and Plan Assessment & Plan Ingrown toenail, right hallux Debridement performed. Future nail avulsion considered if symptoms persist and vascular status is sufficient. - Performed debridement of the distal aspect, medial border of the right hallux nail. This relieved the patient's pain - Discussed potential for future nail avulsion if symptoms persist and vascular status is adequate.  Onychomycosis - Discussed the diagnosis of onychomycosis.  We discussed different treatment options ranging from terbinafine, Penlac, doing nothing, laser therapy.   - Patient not interested in current therapies offered. - Debridement of nails 1 through 5 bilaterally performed without incident  Peripheral arterial disease Peripheral arterial disease with aneurysm and non-palpable lower extremity pulses. Updated vascular assessment required prior to surgical intervention to ensure adequate healing potential. - Ordered non-invasive vascular study (ankle-brachial index) to assess lower extremity blood flow prior to considering nail avulsion. Last ABI performed in 2019 showed decreased blood flow      No follow-ups on file.   Prentice Ovens, DPM AACFAS Fellowship Trained Podiatric Surgeon [facility]     [  1]  Current Outpatient Medications:    atorvastatin  (LIPITOR) 40 MG tablet, Take 1 tablet (40 mg total) by mouth daily., Disp: 30 tablet, Rfl: 6    methocarbamol  (ROBAXIN ) 500 MG tablet, Take 1 tablet (500 mg total) by mouth 2 (two) times daily., Disp: 20 tablet, Rfl: 0   omeprazole  (PRILOSEC) 20 MG capsule, Take 1 capsule (20 mg total) by mouth daily., Disp: 30 capsule, Rfl: 3   tamsulosin (FLOMAX) 0.4 MG CAPS capsule, Take 0.4 mg by mouth daily., Disp: , Rfl:  [2]  Social History Tobacco Use  Smoking Status Every Day   Current packs/day: 0.25   Average packs/day: 0.3 packs/day for 17.0 years (4.3 ttl pk-yrs)   Types: Cigarettes  Smokeless Tobacco Former   Types: Chew   Quit date: 01/20/1974  Tobacco Comments   3-4 cigarettes per day  [3]  Allergies Allergen Reactions   Aspirin  Nausea Only and Other (See Comments)    HIGH DOSE  Upset patients   stomach-    "

## 2024-07-03 NOTE — Addendum Note (Signed)
 Addended by: MAGDALEN BARTER on: 07/03/2024 08:17 AM   Modules accepted: Orders
